# Patient Record
Sex: Female | Born: 1935
Health system: Southern US, Community
[De-identification: ages and names within clinical notes are randomized; demographics above are authoritative.]

## PROBLEM LIST (undated history)

## (undated) DIAGNOSIS — Z87442 Personal history of urinary calculi: Secondary | ICD-10-CM

## (undated) DIAGNOSIS — E782 Mixed hyperlipidemia: Secondary | ICD-10-CM

## (undated) DIAGNOSIS — N200 Calculus of kidney: Secondary | ICD-10-CM

## (undated) DIAGNOSIS — I10 Essential (primary) hypertension: Secondary | ICD-10-CM

## (undated) DIAGNOSIS — C801 Malignant (primary) neoplasm, unspecified: Secondary | ICD-10-CM

## (undated) HISTORY — DX: Essential (primary) hypertension: I10

## (undated) HISTORY — PX: OTHER SURGICAL HISTORY: SHX169

## (undated) HISTORY — DX: Calculus of kidney: N20.0

## (undated) HISTORY — PX: ABDOMINAL HYSTERECTOMY: SHX81

## (undated) HISTORY — DX: Mixed hyperlipidemia: E78.2

---

## 2004-07-27 ENCOUNTER — Encounter: Payer: Self-pay | Admitting: Orthopedic Surgery

## 2004-07-27 ENCOUNTER — Emergency Department (HOSPITAL_COMMUNITY): Admission: EM | Admit: 2004-07-27 | Discharge: 2004-07-27 | Payer: Self-pay | Admitting: Emergency Medicine

## 2004-08-05 ENCOUNTER — Ambulatory Visit (HOSPITAL_COMMUNITY): Admission: RE | Admit: 2004-08-05 | Discharge: 2004-08-05 | Payer: Self-pay | Admitting: Orthopedic Surgery

## 2004-11-27 HISTORY — PX: OTHER SURGICAL HISTORY: SHX169

## 2005-01-18 ENCOUNTER — Ambulatory Visit: Payer: Self-pay | Admitting: Orthopedic Surgery

## 2005-01-27 ENCOUNTER — Ambulatory Visit (HOSPITAL_COMMUNITY): Admission: RE | Admit: 2005-01-27 | Discharge: 2005-01-27 | Payer: Self-pay | Admitting: Orthopedic Surgery

## 2005-01-27 ENCOUNTER — Ambulatory Visit: Payer: Self-pay | Admitting: Orthopedic Surgery

## 2005-01-30 ENCOUNTER — Ambulatory Visit: Payer: Self-pay | Admitting: Orthopedic Surgery

## 2005-01-31 ENCOUNTER — Encounter (HOSPITAL_COMMUNITY): Admission: RE | Admit: 2005-01-31 | Discharge: 2005-03-02 | Payer: Self-pay | Admitting: Orthopedic Surgery

## 2005-02-20 ENCOUNTER — Ambulatory Visit: Payer: Self-pay | Admitting: Orthopedic Surgery

## 2005-02-23 ENCOUNTER — Ambulatory Visit (HOSPITAL_COMMUNITY): Admission: RE | Admit: 2005-02-23 | Discharge: 2005-02-23 | Payer: Self-pay | Admitting: Family Medicine

## 2005-09-19 ENCOUNTER — Encounter (INDEPENDENT_AMBULATORY_CARE_PROVIDER_SITE_OTHER): Payer: Self-pay | Admitting: Family Medicine

## 2005-09-19 LAB — CONVERTED CEMR LAB: Blood Glucose, Fasting: 96 mg/dL

## 2005-09-26 ENCOUNTER — Ambulatory Visit (HOSPITAL_COMMUNITY): Admission: RE | Admit: 2005-09-26 | Discharge: 2005-09-26 | Payer: Self-pay | Admitting: Family Medicine

## 2006-02-19 ENCOUNTER — Ambulatory Visit (HOSPITAL_COMMUNITY): Admission: RE | Admit: 2006-02-19 | Discharge: 2006-02-19 | Payer: Self-pay | Admitting: Family Medicine

## 2006-08-21 ENCOUNTER — Ambulatory Visit: Payer: Self-pay | Admitting: Family Medicine

## 2006-10-04 ENCOUNTER — Ambulatory Visit: Payer: Self-pay | Admitting: Family Medicine

## 2006-10-09 ENCOUNTER — Encounter (INDEPENDENT_AMBULATORY_CARE_PROVIDER_SITE_OTHER): Payer: Self-pay | Admitting: Family Medicine

## 2006-10-09 ENCOUNTER — Ambulatory Visit (HOSPITAL_COMMUNITY): Admission: RE | Admit: 2006-10-09 | Discharge: 2006-10-09 | Payer: Self-pay | Admitting: Family Medicine

## 2006-10-09 LAB — CONVERTED CEMR LAB
TSH: 1.558 microintl units/mL
WBC, blood: 4.2 10*3/uL

## 2006-10-29 ENCOUNTER — Other Ambulatory Visit: Admission: RE | Admit: 2006-10-29 | Discharge: 2006-10-29 | Payer: Self-pay | Admitting: Internal Medicine

## 2006-10-29 ENCOUNTER — Encounter (INDEPENDENT_AMBULATORY_CARE_PROVIDER_SITE_OTHER): Payer: Self-pay | Admitting: Family Medicine

## 2006-10-29 ENCOUNTER — Ambulatory Visit: Payer: Self-pay | Admitting: Family Medicine

## 2006-10-29 LAB — CONVERTED CEMR LAB: Pap Smear: NORMAL

## 2006-10-30 ENCOUNTER — Ambulatory Visit (HOSPITAL_COMMUNITY): Admission: RE | Admit: 2006-10-30 | Discharge: 2006-10-30 | Payer: Self-pay | Admitting: Family Medicine

## 2006-11-01 ENCOUNTER — Ambulatory Visit (HOSPITAL_COMMUNITY): Admission: RE | Admit: 2006-11-01 | Discharge: 2006-11-01 | Payer: Self-pay | Admitting: Family Medicine

## 2006-11-08 ENCOUNTER — Encounter (INDEPENDENT_AMBULATORY_CARE_PROVIDER_SITE_OTHER): Payer: Self-pay | Admitting: Family Medicine

## 2006-11-08 LAB — CONVERTED CEMR LAB: RBC count: 4.81 10*6/uL

## 2006-11-10 ENCOUNTER — Encounter (INDEPENDENT_AMBULATORY_CARE_PROVIDER_SITE_OTHER): Payer: Self-pay | Admitting: Family Medicine

## 2006-11-10 DIAGNOSIS — I10 Essential (primary) hypertension: Secondary | ICD-10-CM | POA: Insufficient documentation

## 2006-11-10 DIAGNOSIS — E785 Hyperlipidemia, unspecified: Secondary | ICD-10-CM | POA: Insufficient documentation

## 2006-11-26 ENCOUNTER — Ambulatory Visit: Payer: Self-pay | Admitting: Family Medicine

## 2006-11-26 ENCOUNTER — Encounter (INDEPENDENT_AMBULATORY_CARE_PROVIDER_SITE_OTHER): Payer: Self-pay | Admitting: Internal Medicine

## 2006-11-26 LAB — CONVERTED CEMR LAB
ALT: 23 units/L (ref 0–35)
AST: 22 units/L (ref 0–37)
Albumin: 4.1 g/dL (ref 3.5–5.2)
Alkaline Phosphatase: 64 units/L (ref 39–117)
BUN: 13 mg/dL (ref 6–23)
CO2: 31 meq/L (ref 19–32)
Calcium: 9.3 mg/dL (ref 8.4–10.5)
Chloride: 102 meq/L (ref 96–112)
Creatinine, Ser: 0.86 mg/dL (ref 0.40–1.20)
Glucose, Bld: 108 mg/dL — ABNORMAL HIGH (ref 70–99)
Potassium: 3.5 meq/L (ref 3.5–5.3)
Sodium: 143 meq/L (ref 135–145)
Total Bilirubin: 0.5 mg/dL (ref 0.3–1.2)
Total Protein: 6.8 g/dL (ref 6.0–8.3)

## 2007-01-29 ENCOUNTER — Encounter (INDEPENDENT_AMBULATORY_CARE_PROVIDER_SITE_OTHER): Payer: Self-pay | Admitting: Family Medicine

## 2007-02-26 ENCOUNTER — Ambulatory Visit: Payer: Self-pay | Admitting: Family Medicine

## 2007-02-26 LAB — CONVERTED CEMR LAB
Cholesterol, target level: 200 mg/dL
HDL goal, serum: 40 mg/dL
LDL Goal: 130 mg/dL

## 2007-02-27 ENCOUNTER — Encounter (INDEPENDENT_AMBULATORY_CARE_PROVIDER_SITE_OTHER): Payer: Self-pay | Admitting: Family Medicine

## 2007-02-27 LAB — CONVERTED CEMR LAB
ALT: 20 units/L (ref 0–35)
AST: 20 units/L (ref 0–37)
Albumin: 4.3 g/dL (ref 3.5–5.2)
Alkaline Phosphatase: 88 units/L (ref 39–117)
BUN: 16 mg/dL (ref 6–23)
CO2: 27 meq/L (ref 19–32)
Calcium: 9.2 mg/dL (ref 8.4–10.5)
Chloride: 100 meq/L (ref 96–112)
Cholesterol: 146 mg/dL (ref 0–200)
Creatinine, Ser: 0.89 mg/dL (ref 0.40–1.20)
Glucose, Bld: 98 mg/dL (ref 70–99)
HDL: 62 mg/dL (ref >39)
LDL Cholesterol: 72 mg/dL (ref 0–99)
Potassium: 3.6 meq/L (ref 3.5–5.3)
Sodium: 142 meq/L (ref 135–145)
Total Bilirubin: 0.6 mg/dL (ref 0.3–1.2)
Total CHOL/HDL Ratio: 2.4
Total Protein: 7.1 g/dL (ref 6.0–8.3)
Triglycerides: 61 mg/dL (ref <150)
VLDL: 12 mg/dL (ref 0–40)

## 2007-06-11 ENCOUNTER — Ambulatory Visit: Payer: Self-pay | Admitting: Family Medicine

## 2007-06-14 ENCOUNTER — Encounter (INDEPENDENT_AMBULATORY_CARE_PROVIDER_SITE_OTHER): Payer: Self-pay | Admitting: Family Medicine

## 2007-06-14 ENCOUNTER — Telehealth (INDEPENDENT_AMBULATORY_CARE_PROVIDER_SITE_OTHER): Payer: Self-pay | Admitting: *Deleted

## 2007-06-25 ENCOUNTER — Ambulatory Visit: Payer: Self-pay | Admitting: Family Medicine

## 2007-06-26 ENCOUNTER — Ambulatory Visit (HOSPITAL_COMMUNITY): Payer: Self-pay | Admitting: Oncology

## 2007-06-26 ENCOUNTER — Encounter (HOSPITAL_COMMUNITY): Admission: RE | Admit: 2007-06-26 | Discharge: 2007-07-26 | Payer: Self-pay | Admitting: Oncology

## 2007-07-10 ENCOUNTER — Ambulatory Visit: Payer: Self-pay | Admitting: Gastroenterology

## 2007-07-18 ENCOUNTER — Encounter (INDEPENDENT_AMBULATORY_CARE_PROVIDER_SITE_OTHER): Payer: Self-pay | Admitting: Family Medicine

## 2007-08-14 ENCOUNTER — Ambulatory Visit: Payer: Self-pay | Admitting: Family Medicine

## 2007-08-15 LAB — CONVERTED CEMR LAB
ALT: 19 units/L (ref 0–35)
AST: 18 units/L (ref 0–37)
Albumin: 4.2 g/dL (ref 3.5–5.2)
Alkaline Phosphatase: 73 units/L (ref 39–117)
BUN: 11 mg/dL (ref 6–23)
CO2: 30 meq/L (ref 19–32)
Calcium: 9.3 mg/dL (ref 8.4–10.5)
Chloride: 102 meq/L (ref 96–112)
Creatinine, Ser: 0.84 mg/dL (ref 0.40–1.20)
Glucose, Bld: 82 mg/dL (ref 70–99)
Potassium: 3.6 meq/L (ref 3.5–5.3)
Sodium: 144 meq/L (ref 135–145)
Total Bilirubin: 1 mg/dL (ref 0.3–1.2)
Total Protein: 6.8 g/dL (ref 6.0–8.3)

## 2007-08-20 ENCOUNTER — Ambulatory Visit (HOSPITAL_COMMUNITY): Admission: RE | Admit: 2007-08-20 | Discharge: 2007-08-20 | Payer: Self-pay | Admitting: Gastroenterology

## 2007-08-20 ENCOUNTER — Ambulatory Visit: Payer: Self-pay | Admitting: Gastroenterology

## 2007-09-24 ENCOUNTER — Ambulatory Visit: Payer: Self-pay | Admitting: Gastroenterology

## 2007-09-24 ENCOUNTER — Ambulatory Visit: Payer: Self-pay | Admitting: Family Medicine

## 2007-10-09 ENCOUNTER — Telehealth (INDEPENDENT_AMBULATORY_CARE_PROVIDER_SITE_OTHER): Payer: Self-pay | Admitting: *Deleted

## 2007-11-12 ENCOUNTER — Encounter (INDEPENDENT_AMBULATORY_CARE_PROVIDER_SITE_OTHER): Payer: Self-pay | Admitting: Family Medicine

## 2007-11-13 ENCOUNTER — Ambulatory Visit: Payer: Self-pay | Admitting: Family Medicine

## 2007-11-13 LAB — CONVERTED CEMR LAB

## 2007-11-14 ENCOUNTER — Telehealth (INDEPENDENT_AMBULATORY_CARE_PROVIDER_SITE_OTHER): Payer: Self-pay | Admitting: *Deleted

## 2007-11-14 LAB — CONVERTED CEMR LAB
ALT: 17 units/L (ref 0–35)
AST: 21 units/L (ref 0–37)
Albumin: 4.3 g/dL (ref 3.5–5.2)
Alkaline Phosphatase: 77 units/L (ref 39–117)
BUN: 14 mg/dL (ref 6–23)
Basophils Absolute: 0 10*3/uL (ref 0.0–0.1)
Basophils Relative: 0 % (ref 0–1)
CO2: 27 meq/L (ref 19–32)
Calcium: 8.9 mg/dL (ref 8.4–10.5)
Chloride: 100 meq/L (ref 96–112)
Cholesterol: 167 mg/dL (ref 0–200)
Creatinine, Ser: 1 mg/dL (ref 0.40–1.20)
Eosinophils Absolute: 0.1 10*3/uL — ABNORMAL LOW (ref 0.2–0.7)
Eosinophils Relative: 1 % (ref 0–5)
Glucose, Bld: 105 mg/dL — ABNORMAL HIGH (ref 70–99)
HCT: 39.1 % (ref 36.0–46.0)
HDL: 56 mg/dL (ref >39)
Hemoglobin: 12.8 g/dL (ref 12.0–15.0)
LDL Cholesterol: 91 mg/dL (ref 0–99)
Lymphocytes Relative: 25 % (ref 12–46)
Lymphs Abs: 1.4 10*3/uL (ref 0.7–4.0)
MCHC: 32.7 g/dL (ref 30.0–36.0)
MCV: 80.8 fL (ref 78.0–100.0)
Monocytes Absolute: 0.5 10*3/uL (ref 0.1–1.0)
Monocytes Relative: 10 % (ref 3–12)
Neutro Abs: 3.6 10*3/uL (ref 1.7–7.7)
Neutrophils Relative %: 64 % (ref 43–77)
Platelets: 241 10*3/uL (ref 150–400)
Potassium: 3.4 meq/L — ABNORMAL LOW (ref 3.5–5.3)
RBC: 4.84 M/uL (ref 3.87–5.11)
RDW: 14.6 % (ref 11.5–15.5)
Sodium: 142 meq/L (ref 135–145)
TSH: 1.571 microintl units/mL (ref 0.350–5.50)
Total Bilirubin: 0.7 mg/dL (ref 0.3–1.2)
Total CHOL/HDL Ratio: 3
Total Protein: 6.9 g/dL (ref 6.0–8.3)
Triglycerides: 100 mg/dL (ref <150)
VLDL: 20 mg/dL (ref 0–40)
WBC: 5.7 10*3/uL (ref 4.0–10.5)

## 2007-11-18 ENCOUNTER — Ambulatory Visit (HOSPITAL_COMMUNITY): Admission: RE | Admit: 2007-11-18 | Discharge: 2007-11-18 | Payer: Self-pay | Admitting: Family Medicine

## 2007-11-19 ENCOUNTER — Telehealth (INDEPENDENT_AMBULATORY_CARE_PROVIDER_SITE_OTHER): Payer: Self-pay | Admitting: *Deleted

## 2007-12-23 ENCOUNTER — Encounter (INDEPENDENT_AMBULATORY_CARE_PROVIDER_SITE_OTHER): Payer: Self-pay | Admitting: Family Medicine

## 2007-12-25 ENCOUNTER — Ambulatory Visit: Payer: Self-pay | Admitting: Family Medicine

## 2008-02-27 ENCOUNTER — Encounter (INDEPENDENT_AMBULATORY_CARE_PROVIDER_SITE_OTHER): Payer: Self-pay | Admitting: Family Medicine

## 2008-03-24 ENCOUNTER — Ambulatory Visit: Payer: Self-pay | Admitting: Family Medicine

## 2008-03-24 LAB — CONVERTED CEMR LAB
Bilirubin Urine: NEGATIVE
Glucose, Urine, Semiquant: NEGATIVE
Ketones, urine, test strip: NEGATIVE
Nitrite: NEGATIVE
Protein, U semiquant: 30
Specific Gravity, Urine: 1.015
Urobilinogen, UA: 0.2
WBC Urine, dipstick: NEGATIVE
pH: 6.5

## 2008-03-25 ENCOUNTER — Encounter (INDEPENDENT_AMBULATORY_CARE_PROVIDER_SITE_OTHER): Payer: Self-pay | Admitting: Family Medicine

## 2008-03-25 ENCOUNTER — Telehealth (INDEPENDENT_AMBULATORY_CARE_PROVIDER_SITE_OTHER): Payer: Self-pay | Admitting: *Deleted

## 2008-03-25 LAB — CONVERTED CEMR LAB
ALT: 16 units/L (ref 0–35)
AST: 20 units/L (ref 0–37)
Albumin: 4.1 g/dL (ref 3.5–5.2)
Alkaline Phosphatase: 73 units/L (ref 39–117)
BUN: 15 mg/dL (ref 6–23)
Bacteria, UA: NONE SEEN
CO2: 26 meq/L (ref 19–32)
Calcium: 8.7 mg/dL (ref 8.4–10.5)
Chloride: 103 meq/L (ref 96–112)
Creatinine, Ser: 0.98 mg/dL (ref 0.40–1.20)
Glucose, Bld: 111 mg/dL — ABNORMAL HIGH (ref 70–99)
Potassium: 3.4 meq/L — ABNORMAL LOW (ref 3.5–5.3)
RBC / HPF: NONE SEEN (ref <3)
Sodium: 145 meq/L (ref 135–145)
Total Bilirubin: 0.5 mg/dL (ref 0.3–1.2)
Total Protein: 6.7 g/dL (ref 6.0–8.3)

## 2008-04-09 ENCOUNTER — Telehealth (INDEPENDENT_AMBULATORY_CARE_PROVIDER_SITE_OTHER): Payer: Self-pay | Admitting: *Deleted

## 2008-05-05 ENCOUNTER — Ambulatory Visit: Payer: Self-pay | Admitting: Family Medicine

## 2008-06-22 ENCOUNTER — Encounter (INDEPENDENT_AMBULATORY_CARE_PROVIDER_SITE_OTHER): Payer: Self-pay | Admitting: Family Medicine

## 2008-06-25 ENCOUNTER — Encounter (HOSPITAL_COMMUNITY): Admission: RE | Admit: 2008-06-25 | Discharge: 2008-07-25 | Payer: Self-pay | Admitting: Oncology

## 2008-06-25 ENCOUNTER — Encounter (INDEPENDENT_AMBULATORY_CARE_PROVIDER_SITE_OTHER): Payer: Self-pay | Admitting: Family Medicine

## 2008-06-25 ENCOUNTER — Ambulatory Visit (HOSPITAL_COMMUNITY): Payer: Self-pay | Admitting: Oncology

## 2008-08-07 ENCOUNTER — Ambulatory Visit: Payer: Self-pay | Admitting: Family Medicine

## 2008-09-18 ENCOUNTER — Ambulatory Visit: Payer: Self-pay | Admitting: Family Medicine

## 2008-12-18 ENCOUNTER — Ambulatory Visit: Payer: Self-pay | Admitting: Family Medicine

## 2008-12-19 ENCOUNTER — Encounter (INDEPENDENT_AMBULATORY_CARE_PROVIDER_SITE_OTHER): Payer: Self-pay | Admitting: Family Medicine

## 2008-12-22 LAB — CONVERTED CEMR LAB
ALT: 18 units/L (ref 0–35)
AST: 21 units/L (ref 0–37)
Albumin: 3.8 g/dL (ref 3.5–5.2)
Alkaline Phosphatase: 75 units/L (ref 39–117)
BUN: 11 mg/dL (ref 6–23)
Basophils Absolute: 0 10*3/uL (ref 0.0–0.1)
Basophils Relative: 0 % (ref 0–1)
CO2: 26 meq/L (ref 19–32)
Calcium: 8.2 mg/dL — ABNORMAL LOW (ref 8.4–10.5)
Chloride: 103 meq/L (ref 96–112)
Cholesterol: 144 mg/dL (ref 0–200)
Creatinine, Ser: 0.88 mg/dL (ref 0.40–1.20)
Eosinophils Absolute: 0.1 10*3/uL (ref 0.0–0.7)
Eosinophils Relative: 3 % (ref 0–5)
Glucose, Bld: 105 mg/dL — ABNORMAL HIGH (ref 70–99)
HCT: 42 % (ref 36.0–46.0)
HDL: 59 mg/dL (ref >39)
Hemoglobin: 13.9 g/dL (ref 12.0–15.0)
LDL Cholesterol: 73 mg/dL (ref 0–99)
Lymphocytes Relative: 31 % (ref 12–46)
Lymphs Abs: 1.4 10*3/uL (ref 0.7–4.0)
MCHC: 33.1 g/dL (ref 30.0–36.0)
MCV: 82.8 fL (ref 78.0–100.0)
Monocytes Absolute: 0.5 10*3/uL (ref 0.1–1.0)
Monocytes Relative: 10 % (ref 3–12)
Neutro Abs: 2.7 10*3/uL (ref 1.7–7.7)
Neutrophils Relative %: 56 % (ref 43–77)
Platelets: 172 10*3/uL (ref 150–400)
Potassium: 3.3 meq/L — ABNORMAL LOW (ref 3.5–5.3)
RBC: 5.07 M/uL (ref 3.87–5.11)
RDW: 14.7 % (ref 11.5–15.5)
Sodium: 143 meq/L (ref 135–145)
TSH: 1.491 microintl units/mL (ref 0.350–4.50)
Total Bilirubin: 0.8 mg/dL (ref 0.3–1.2)
Total CHOL/HDL Ratio: 2.4
Total Protein: 6.4 g/dL (ref 6.0–8.3)
Triglycerides: 60 mg/dL (ref <150)
VLDL: 12 mg/dL (ref 0–40)
WBC: 4.7 10*3/uL (ref 4.0–10.5)

## 2008-12-24 ENCOUNTER — Encounter (INDEPENDENT_AMBULATORY_CARE_PROVIDER_SITE_OTHER): Payer: Self-pay | Admitting: Family Medicine

## 2008-12-24 ENCOUNTER — Ambulatory Visit (HOSPITAL_COMMUNITY): Admission: RE | Admit: 2008-12-24 | Discharge: 2008-12-24 | Payer: Self-pay | Admitting: Family Medicine

## 2009-01-06 ENCOUNTER — Ambulatory Visit (HOSPITAL_COMMUNITY): Admission: RE | Admit: 2009-01-06 | Discharge: 2009-01-06 | Payer: Self-pay | Admitting: Family Medicine

## 2009-04-08 ENCOUNTER — Ambulatory Visit: Payer: Self-pay | Admitting: Family Medicine

## 2009-04-16 ENCOUNTER — Ambulatory Visit: Payer: Self-pay | Admitting: Family Medicine

## 2009-04-17 ENCOUNTER — Encounter (INDEPENDENT_AMBULATORY_CARE_PROVIDER_SITE_OTHER): Payer: Self-pay | Admitting: Family Medicine

## 2009-04-19 LAB — CONVERTED CEMR LAB
ALT: 15 units/L (ref 0–35)
AST: 19 units/L (ref 0–37)
Albumin: 4.1 g/dL (ref 3.5–5.2)
Alkaline Phosphatase: 73 units/L (ref 39–117)
BUN: 13 mg/dL (ref 6–23)
CO2: 27 meq/L (ref 19–32)
Calcium: 8.9 mg/dL (ref 8.4–10.5)
Chloride: 100 meq/L (ref 96–112)
Creatinine, Ser: 0.95 mg/dL (ref 0.40–1.20)
Glucose, Bld: 97 mg/dL (ref 70–99)
Potassium: 3.8 meq/L (ref 3.5–5.3)
Sodium: 142 meq/L (ref 135–145)
Total Bilirubin: 0.6 mg/dL (ref 0.3–1.2)
Total Protein: 7 g/dL (ref 6.0–8.3)

## 2009-06-25 ENCOUNTER — Encounter (INDEPENDENT_AMBULATORY_CARE_PROVIDER_SITE_OTHER): Payer: Self-pay | Admitting: Family Medicine

## 2009-06-25 ENCOUNTER — Encounter (HOSPITAL_COMMUNITY): Admission: RE | Admit: 2009-06-25 | Discharge: 2009-07-25 | Payer: Self-pay | Admitting: Oncology

## 2009-06-25 ENCOUNTER — Ambulatory Visit (HOSPITAL_COMMUNITY): Payer: Self-pay | Admitting: Oncology

## 2009-07-06 ENCOUNTER — Encounter (INDEPENDENT_AMBULATORY_CARE_PROVIDER_SITE_OTHER): Payer: Self-pay | Admitting: Family Medicine

## 2009-07-16 ENCOUNTER — Ambulatory Visit: Payer: Self-pay | Admitting: Family Medicine

## 2009-08-24 ENCOUNTER — Encounter (INDEPENDENT_AMBULATORY_CARE_PROVIDER_SITE_OTHER): Payer: Self-pay | Admitting: Family Medicine

## 2010-01-07 ENCOUNTER — Ambulatory Visit (HOSPITAL_COMMUNITY): Admission: RE | Admit: 2010-01-07 | Discharge: 2010-01-07 | Payer: Self-pay | Admitting: Internal Medicine

## 2010-07-06 ENCOUNTER — Encounter (HOSPITAL_COMMUNITY): Admission: RE | Admit: 2010-07-06 | Discharge: 2010-08-05 | Payer: Self-pay | Admitting: Oncology

## 2010-07-06 ENCOUNTER — Ambulatory Visit (HOSPITAL_COMMUNITY): Payer: Self-pay | Admitting: Oncology

## 2010-09-07 ENCOUNTER — Ambulatory Visit: Payer: Self-pay | Admitting: Orthopedic Surgery

## 2010-09-08 ENCOUNTER — Encounter: Payer: Self-pay | Admitting: Orthopedic Surgery

## 2010-12-18 ENCOUNTER — Encounter: Payer: Self-pay | Admitting: Family Medicine

## 2010-12-28 NOTE — Progress Notes (Signed)
Summary: Progress note  Progress note   Imported By: Jacklynn Ganong 08/19/2010 08:51:35  _____________________________________________________________________  External Attachment:    Type:   Image     Comment:   External Document

## 2010-12-28 NOTE — Progress Notes (Signed)
Summary: Initial evaluation  Initial evaluation   Imported By: Jacklynn Ganong 08/19/2010 08:49:34  _____________________________________________________________________  External Attachment:    Type:   Image     Comment:   External Document

## 2010-12-28 NOTE — Op Note (Signed)
Summary: Left knee arthroscopy  Left knee arthroscopy   Imported By: Jacklynn Ganong 08/19/2010 08:50:45  _____________________________________________________________________  External Attachment:    Type:   Image     Comment:   External Document

## 2010-12-28 NOTE — Letter (Signed)
Summary: History form  History form   Imported By: Jacklynn Ganong 09/08/2010 16:17:09  _____________________________________________________________________  External Attachment:    Type:   Image     Comment:   External Document

## 2010-12-28 NOTE — Assessment & Plan Note (Signed)
Summary: BI SHOULDER PAIN NEEDS XR/BCBS/BSF   Vital Signs:  Patient profile:   77 year old female Height:      61 inches Weight:      179 pounds Pulse rate:   62 / minute Resp:     16 per minute  Vitals Entered By: Fuller Canada MD (September 07, 2010 2:54 PM)  Visit Type:  new patient Referring Provider:  self Primary Provider:  Dr. Dwana Melena  CC:  shoulder pain.  History of Present Illness: I saw Miranda [name] in the office today for an initial visit.  She is a 77 years old woman with the complaint ZO:[id number] shoulder pain.  Xrays today.  Meds: Benicar 40/25, Celebrex 200mg , Vesicare 5mg , Lipitor 10mg , ASA 81.The patient complains of pain off and on for the last month worse at night involving both shoulders.  She denies neck pain.  The pain is described as severe, throbbing and intermittent starting gradually.  The pain is exacerbated when she does overhead activity and the pain radiates into the upper anterolateral deltoid.  She is on Celebrex 200 mg once a day and she's been on it for a month but it doesn't seem to help.  She does not complain of any catching locking or numbness.      Allergies: No Known Drug Allergies  Past History:  Past Surgical History: 1. Hysterectomy - excessive bleeding, no cancer 2. Left Lung-lobectomy lower lobe - lung cancer. 3. Left knee arthroscopuy due to torn ligmanent and cartiallge Romeo Apple 2006.  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  Review of  Systems Constitutional:  Complains of weight gain; denies weight loss, fever, chills, and fatigue. Cardiovascular:  Denies chest pain, palpitations, fainting, and murmurs. Respiratory:  Denies short of breath, wheezing, couch, tightness, pain on inspiration, and snoring . Gastrointestinal:  Denies heartburn, nausea, vomiting, diarrhea, constipation, and blood in your stools. Genitourinary:  Complains of frequency; denies urgency, difficulty urinating, painful urination, flank pain, and bleeding in urine. Neurologic:  Denies numbness, tingling, unsteady gait, dizziness, tremors, and seizure. Musculoskeletal:  Complains of swelling; denies joint pain, instability, stiffness, redness, heat, and muscle pain. Endocrine:  Denies excessive thirst, exessive urination, and heat or cold intolerance. Psychiatric:  Denies nervousness, depression, anxiety, and hallucinations. Skin:  Denies changes in the skin, poor healing, rash, itching, and redness. HEENT:  Denies blurred or double vision, eye pain, redness, and watering. Immunology:  Complains of seasonal allergies; denies sinus problems and allergic to bee stings. Hemoatologic:  Denies easy bleeding and brusing.  Physical Exam  Skin:  intact without lesions or rashes Cervical Nodes:  no significant adenopathy Psych:  alert and cooperative; normal mood and affect; normal attention span and concentration   Shoulder/Elbow Exam  General:    Well-developed, well-nourished, normal body habitus; no deformities, normal grooming.    Vital signs are stable as recorded  Skin:    Intact, no scars, lesions, rashes, cafe au lait spots or bruising.    Inspection:    Inspection is normal.  Palpation:    Non-tender to palpation bilaterally.    Vascular:    Radial, ulnar, brachial, and axillary pulses 2+ and symmetric; capillary refill less than 2 seconds; no evidence of ischemia, clubbing, or cyanosis.    Sensory:    Gross sensation intact in the  upper extremities.    Motor:    Normal strength in the upper extremities.    Reflexes:    Normal reflexes in the upper extremities.    Shoulder Exam:    Right:    Inspection:  Normal    Palpation:  Normal    Stability:  stable    Tenderness:  no    Swelling:  no    Erythema:  no    Range of Motion:       Flexion-Active: 120       Extension-Active: 45       Flexion-Passive: 150       Extension-Passive: 45       External Rotation : 45       Interior Rotation : L1    Left:    Inspection:  Normal    Palpation:  Normal    Stability:  stable    Tenderness:  no    Swelling:  no    Erythema:  no    Range of Motion:       Flexion-Active: 120       Extension-Active: 45       Flexion-Passive: 150       Extension-Passive: 45       External Rotation : 45       Interior Rotation : L1  Tinel's:    Tinel's negative over cubital, pronator, carpal, and Guyon's area.    Impingement Sign NEER:    Right positive; Left positive Impingement Sign HAWKINS:    Right negative; Left negative Apprehension Sign:    Right negative; Left negative Sulcus Sign:    Right negative; Left negative Yerguson:    Right negative; Left negative AC joint Adduction Test:    Right negative; Left negative FLEX-IR POST STRESS Test:    Right negative; Left negative   Impression & Recommendations:  Problem # 1:  IMPINGEMENT SYNDROME (ICD-726.2) Assessment New  bilateral shoulder x-rays AP and lateral.  The glenohumeral joint spaces are well maintained.  There were no bone lesions.  The acromion is a type II.  There is some sclerosis of the greater tuberosity especially on the RIGHT side.  Impression no acute findings in the shoulder x-ray normal shoulder x-ray  Bilateral subacromial space injections RIGHT side first and the LEFT done in the following manner Verbal consent obtained/The shoulder was injected with depomedrol 40mg /cc and sensorcaine .25% . There were no complications  Orders: New  Patient Level III (13086) Shoulder x-ray, bilateral (57846) Joint Aspirate / Injection, Large (20610) Depo- Medrol 40mg  (J1030)  Patient Instructions: 1)  You have received an injection of cortisone today. You may experience increased pain at the injection site. Apply ice pack to the area for 20 minutes every 2 hours and take 2 xtra strength tylenol every 8 hours. This increased pain will usually resolve in 24 hours. The injection will take effect in 3-10 days.  2)  Limit activity to comfort and avoid activities that increase discomfort.  Apply moist heat and/or ice to shoulder and take medication celebrex .  3)  Please schedule a follow-up appointment as needed.

## 2011-01-17 ENCOUNTER — Other Ambulatory Visit (HOSPITAL_COMMUNITY): Payer: Self-pay | Admitting: Internal Medicine

## 2011-01-17 DIAGNOSIS — Z139 Encounter for screening, unspecified: Secondary | ICD-10-CM

## 2011-01-20 ENCOUNTER — Ambulatory Visit (HOSPITAL_COMMUNITY)
Admission: RE | Admit: 2011-01-20 | Discharge: 2011-01-20 | Disposition: A | Discharge status: Home / Self Care | Payer: Medicare Other | Source: Ambulatory Visit | Attending: Internal Medicine | Admitting: Internal Medicine

## 2011-01-20 DIAGNOSIS — Z139 Encounter for screening, unspecified: Secondary | ICD-10-CM

## 2011-01-20 DIAGNOSIS — Z1231 Encounter for screening mammogram for malignant neoplasm of breast: Secondary | ICD-10-CM | POA: Insufficient documentation

## 2011-02-13 ENCOUNTER — Ambulatory Visit (HOSPITAL_COMMUNITY)
Admission: RE | Admit: 2011-02-13 | Discharge: 2011-02-13 | Disposition: A | Discharge status: Home / Self Care | Payer: Medicare Other | Source: Ambulatory Visit | Attending: Internal Medicine | Admitting: Internal Medicine

## 2011-02-13 ENCOUNTER — Other Ambulatory Visit (HOSPITAL_COMMUNITY): Payer: Self-pay | Admitting: Internal Medicine

## 2011-02-13 DIAGNOSIS — R52 Pain, unspecified: Secondary | ICD-10-CM

## 2011-02-13 DIAGNOSIS — M25559 Pain in unspecified hip: Secondary | ICD-10-CM | POA: Insufficient documentation

## 2011-03-10 ENCOUNTER — Encounter: Payer: Self-pay | Admitting: Orthopedic Surgery

## 2011-03-28 ENCOUNTER — Ambulatory Visit (INDEPENDENT_AMBULATORY_CARE_PROVIDER_SITE_OTHER): Payer: Medicare Other | Admitting: Orthopedic Surgery

## 2011-03-28 VITALS — Ht 60.0 in | Wt 170.0 lb

## 2011-03-28 DIAGNOSIS — M5430 Sciatica, unspecified side: Secondary | ICD-10-CM

## 2011-03-28 MED ORDER — GABAPENTIN 100 MG PO CAPS: ORAL_CAPSULE | ORAL | Status: DC

## 2011-03-29 ENCOUNTER — Encounter: Payer: Self-pay | Admitting: Orthopedic Surgery

## 2011-03-29 NOTE — Progress Notes (Signed)
77 year old female with LEFT hip and leg pain x2 months  Her pain came on gradually without any history of injury.  She's had no previous treatment.  The pain is described as 5/10 it seems to come and go and is worse at night.  It is sometimes better when she is standing and worse when she's sleeping.  Pedicle systems review includes a history of constipation frequency ankle swelling and seasonal ALLERGIES the other systems reviewed and were normal  She's had LEFT knee arthroscopy in 2006  She has arthritis and cancer as part of her family history she is divorced and retired doesn't smoke social drinker.  General appearance is normal she is mildly overweight  She is oriented x3 her mood and affect are normal  She ambulates with no assistive device has no limp  The lumbar spine is tender to palpation on the LEFT side and LEFT gluteal region.  His skin is intact.  Straight leg raise is negative bilaterally  Her lower extremities are normal to inspection and palpation with normal range of motion.  Her strength is 5 over 5 with manual muscle testing the joints are stable the skin is intact  Her pulses are excellent with normal temperature and no edema  Sensory exam is normal  Pathologic reflexes normal.  Cornish and balance are normal  Plain films of the LEFT hip show degenerative changes mild.  We attempted lumbar spine films here only the spot film came out with good clarity and it shows that she has degenerative disc disease and facet arthritis at L5-S1, L4-L5 with joint space narrowing.  Impression sciatica recommend increased gabapentin. Followup one month I also added 12 day dose pack

## 2011-04-11 NOTE — Op Note (Signed)
Miranda [name], Miranda [name]              ACCOUNT NO.:  [id number]   MEDICAL RECORD NO.:  [id number]          PATIENT TYPE:  AMB   LOCATION:  DAY                           FACILITY:  [facility]   PHYSICIAN:  Kassie Mends, M.D.      DATE OF BIRTH:  15-Aug-1934   DATE OF PROCEDURE:  08/20/2007  DATE OF DISCHARGE:                               OPERATIVE REPORT   REFERRING PHYSICIAN:  Franchot Heidelberg, M.D.   PROCEDURE:  Colonoscopy with snare cautery polypectomy, polyp  obliterated and unable to be retrieved.   INDICATION FOR EXAMINATION:  Miranda [name] is a 77 year old female who has a  first-degree relative who had colon cancer diagnosed at age greater than  13.  She presents with a change in bowel habits.  Her last colonoscopy  was 5 years ago.  She denies any history of polyps.   FINDINGS:  1. A 5-mm sessile sigmoid colon polyp removed via snare cautery .  In      an attempt to cauterize the base of the polyp, the polyp was      obliterated.  It was not retrieved.  Otherwise no masses,      inflammatory changes, diverticula or AVMs seen.  2. Normal retroflexed view of the rectum.   RECOMMENDATIONS:  1. She should follow a high-fiber diet.  She is given a handout on      high-fiber diet, constipation and polyps.  2. Screening colonoscopy in 5 years because her polyps could not be      retrieved.  3. Begin aspirin on August 26, 2007.  4. Follow up in 1 month with Dr. Cira Servant for constipation.  5. Add MiraLax 17 grams daily.   MEDICATIONS:  1. Demerol 75 mg IV.  2. Versed 3 mg IV.   PROCEDURE TECHNIQUE:  Physical exam was performed.  Informed consent was  obtained from the patient after explaining the benefits, risks and  alternatives to the procedure.  The patient was connected to monitor and  placed in left lateral position.  Continuous oxygen was provided by  nasal cannula and IV medicine administered through an indwelling  cannula.  After administration of sedation and rectal exam,  the  patient's rectum was intubated, and the  scope was advanced under direct visualization to the cecum.  The scope  was removed slowly by carefully examining the color, texture, anatomy  and integrity of the mucosa on the way out.  The patient was recovered  in endoscopy and discharged home in satisfactory condition.      Kassie Mends, M.D.  Electronically Signed     SM/MEDQ  D:  08/20/2007  T:  08/20/2007  Job:  [id number]

## 2011-04-11 NOTE — Consult Note (Signed)
Miranda [name], Miranda [name]              ACCOUNT NO.:  [id number]   MEDICAL RECORD NO.:  [id number]          PATIENT TYPE:  AMB   LOCATION:  DAY                           FACILITY:  [facility]   PHYSICIAN:  Kassie Mends, M.D.      DATE OF BIRTH:  26-Jul-1934   DATE OF CONSULTATION:  07/10/2007  DATE OF DISCHARGE:                                 CONSULTATION   REASON FOR CONSULTATION:  Change in bowel movements, constipation, need  for colonoscopy.   PRIMARY CARE PHYSICIAN:  Franchot Heidelberg, M.D.   HISTORY OF PRESENT ILLNESS:  Ms. Balash is a 77 year old African-American  female who presents for further evaluation of a change in bowel  movements and constipation.  For the past 4-5 months, she has noted  increasing constipation.  She notes that she has had changes in her  medications and wonders if this is the cause.  She has had recent  increase in her atenolol and addition of amlodipine/benazepril.  She  states she is having a bowel movement about every other day.  At times,  she does have to take Dulcolax.  She has hard stool and has to strain at  times.  Denies any melena, rectal bleeding, abdominal pain, nausea,  vomiting, heartburn, weight loss or anorexia.  Her family history is  significant for colon cancer.  Her father was diagnosed at age 5 and  died with metastatic disease.  The patient's last colonoscopy was 5  years ago before she moved to the area.   CURRENT MEDICATIONS:  1. Atenolol 100 mg daily.  2. Caltrate 600 mg daily.  3. Amlodipine/benazepril 5/10 mg daily.  4. Norvasc 5 mg daily.  5. Lipitor 20 mg daily.  6. Aspirin 81 mg daily.  7. One-A-Day vitamin daily.   ALLERGIES:  No known drug allergies.   PAST MEDICAL HISTORY:  1. Hyperlipidemia.  2. Hypertension.  3. Osteoarthritis.  4. History of adenocarcinoma of the left upper lobe of the lung,      status post resection in 2002.  5. Hysterectomy in 1972 for excessive bleeding.  6. Left knee surgery in 2005.   FAMILY HISTORY:  Mother succumbed to a brain aneurysm at age 48.  Father  died at age 54 of metastatic colon cancer; was diagnosis at age 68.  She  had a sister who died of a stroke at age 22.   SOCIAL HISTORY:  She is divorced.  She has 2 children.  She is living  with one of her sons.  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  She moved  back to her hometown of [place] with her son about 5 years ago.  She  has never been a smoker.  Occasionally drinks alcohol socially, but very  infrequently.   REVIEW OF SYSTEMS:  See history of present illness for GI,  CONSTITUTIONAL.  CARDIOPULMONARY:  She denies any chest pain.  She notes  dyspnea on exertion only when she climbs stairs.   PHYSICAL EXAMINATION:  VITAL SIGNS:  Weight 183, height 5 feet.  Temperature 98.1, blood pressure 128/80, pulse 63.  GENERAL:  Pleasant, well-nourished, well-developed African-American  female in no acute distress.  SKIN:  Warm and dry.  No jaundice.  HEENT:  Sclerae nonicteric.  Oropharyngeal mucosal moist and pink.  No  lymphadenopathy  CHEST:  Lungs are clear to auscultation.  CARDIAC:  Regular rate and rhythm.  Normal S1 and S2.  No murmurs, rubs,  or gallops.  ABDOMEN:  Positive bowel sounds, obese but symmetrical.  Soft,  nontender, nondistended.  No organomegaly or masses.  No rebound  tenderness or guarding.  No abdominal bruits or hernias.  EXTREMITIES:  No edema.   LABORATORY DATA:  Labs from February 27, 2007 - LFT's were normal.   IMPRESSION:  The patient is a pleasant 77 year old lady with a personal  history of lung cancer, family history of colorectal cancer in a first  degree relative, who presents with a change in bowel movements.  Her  increasing constipation could be due to a change in her antihypertensive  medications.  Her last colonoscopy was around 5 years ago.  Given her  change in bowel movements and family  history, will need to pursue  colonoscopy for further evaluation.  I discussed risks, alternatives and  benefits with regards to risks of reaction to medication, bleeding,  infection or perforation, and she is agreeable to proceed.   PLAN:  1. She was encouraged to continue a high fiber diet.  2. May use OTC MiraLax 17 gm daily as needed for constipation.  3. Colonoscopy by Dr. Kassie Mends in the near future.   I would like to thank Dr. Glenford Peers for allowing [place] to take part in  the care of this patient.      Tana Coast, P.A.      Kassie Mends, M.D.  Electronically Signed    LL/MEDQ  D:  07/10/2007  T:  07/11/2007  Job:  [id number]   cc:   Ladona Horns. Mariel Sleet, MD  Fax: [phone]   Franchot Heidelberg, M.D.

## 2011-04-11 NOTE — Assessment & Plan Note (Signed)
NAMEJALAIYAH, Miranda [name]               CHART#:  [id number]   DATE:  09/24/2007                       DOB:  07-16-1934   REFERRING PHYSICIAN:  Franchot Heidelberg.   PROBLEM LIST:  1. Constipation.  2. History of lung cancer.  3. Hypertension.  4. Hyperlipidemia.   SUBJECTIVE:  Miranda [name] is a 77 year old female who was initially seen by  me for open access colonoscopy.  She was complaining of constipation on  that day.  I asked her to add MiraLax.  She has added the MiraLax and  her bowel movements are fine.  She denies any heartburn, indigestion,  nausea or vomiting.  She was okay taking the TriLyte bowel prep.  Her  daughter had adenomatous polyps.  Her 2 sisters are up to date with  their colon cancer screening.   OBJECTIVE:  Weight 183 pounds, height 5 feet, temperature 97.5, blood  pressure 130/90, pulse 80.  IN GENERAL:  She is in no acute distress, alert and oriented x4.  LUNGS:  Clear to auscultation bilaterally.  CARDIOVASCULAR EXAM:  Regular rhythm no murmur.  ABDOMEN:  Bowel sounds are present, soft, nontender, nondistended.   ASSESSMENT:  Miranda [name] is a 77 year old female whose constipation has  been relieved with MiraLax daily.  Thank you for allowing me to see [name] in [name].  My recommendations follow.   RECOMMENDATIONS:  1. She should continue the MiraLax daily.  2. Screening colonoscopy in 2012.  She should continue a high-fiber      diet, she may continue      to use the MiraLax daily.  3. She may follow up with me as an outpatient as needed.       Kassie Mends, M.D.  Electronically Signed     SM/MEDQ  D:  09/24/2007  T:  09/25/2007  Job:  [id number]   cc:   Franchot Heidelberg, M.D.

## 2011-04-14 NOTE — Op Note (Signed)
[name], [name]              ACCOUNT NO.:  [id number]   MEDICAL RECORD NO.:  [id number]          PATIENT TYPE:  AMB   LOCATION:  DAY                           FACILITY:  [facility]   PHYSICIAN:  Vickki Hearing, M.D.DATE OF BIRTH:  [birth year]   DATE OF PROCEDURE:  01/27/2005  DATE OF DISCHARGE:  01/27/2005                                 OPERATIVE REPORT   PREOPERATIVE DIAGNOSIS:  Torn medial meniscus, left knee.   POSTOPERATIVE DIAGNOSIS:  Torn medial meniscus, left knee.   PROCEDURE:  Arthroscopy, left knee, partial medial meniscectomy.   FINDINGS:  Torn medial meniscus, left knee. Mild tibial femoral arthrosis.   INDICATIONS FOR PROCEDURE:  Pain and mechanical symptoms.   SURGEON:  Dr. Romeo Apple.   ANESTHESIA:  Dr. Jayme Cloud and staff.   DETAILS OF PROCEDURE:  [name] was identified in the preoperative holding  area. The left knee was marked as the surgical site, countersigned by the  surgeon. She was given preoperative antibiotics. History and physical was  updated. She was taken to the operating room for anesthesia, and once  anesthesia was satisfactorily administered, the left knee was prepped and  draped in a sterile technique. A time out was taken and completed as  required, confirming the left knee as the surgical site.   A two-incision diagnostic arthroscopy was done on the left knee. Medial  compartment had the primary findings with torn medial meniscus at the  posterior horn. There were mild degenerative changes throughout the knee.   Using a straight duckbill forceps and a straight shaver, the meniscal tear  was resected, and the meniscus was balanced to a stable rim. Repeat probing  of the meniscus found the rim to be stable, and the knee was irrigated and  closed with Steri-Strips. We injected 30 cc of Marcaine, applied sterile  dressings along with a CryoCuff, and then took the patient back to the  recovery room in stable condition.      SEH/MEDQ  D:   02/17/2005  T:  02/17/2005  Job:  [id number]

## 2011-04-14 NOTE — H&P (Signed)
Miranda [name], Miranda [name]              ACCOUNT NO.:  [id number]   MEDICAL RECORD NO.:  [id number]          PATIENT TYPE:  AMB   LOCATION:  DAY                           FACILITY:  [facility]   PHYSICIAN:  Vickki Hearing, M.D.DATE OF BIRTH:  11/29/1933   DATE OF ADMISSION:  DATE OF DISCHARGE:  LH                                HISTORY & PHYSICAL   CHIEF COMPLAINT:  Left knee pain.   HISTORY:  A 77 year old female with left knee pain.  She had an MRI.  She  has a torn meniscus.  Her knee buckles and pops and she has trouble  ambulating.  She has failed conservative treatment after six months.  She  presents for arthroscopy of the left knee for a medial meniscectomy.   REVIEW OF SYSTEMS:  Normal.   ALLERGIES:  She has no allergies.   PAST MEDICAL HISTORY:  1.  She has hypertension.  2.  She had lung cancer treated with surgery.   MEDICATIONS:  She takes hydrochlorothiazide and Maxzide.   FAMILY HISTORY:  Unremarkable.   SOCIAL HISTORY:  She is retired.   PHYSICAL EXAMINATION:  Weight is 168 pounds.  Pulse 60, respiratory rate 16.  Pulses are intact.  No swelling or edema.  She has no popliteal lymph nodes.  Her skin was normal without scars, rashes, lesions or ulcers other than  surgical.  Her neuropsychiatric exam was normal.  She has medial joint line  tenderness.  Her motion is maintained.  Her joint is stable.  Her strength  is normal.  Meniscal signs are positive.   Upper extremities show no contracture, joint subluxation, atrophy or tremor.   DIAGNOSIS:  Torn cartilage of left knee.   PLAN:  Arthroscopy of left knee.      SEH/MEDQ  D:  01/26/2005  T:  01/26/2005  Job:  [id number]   cc:   [facility]  Fax: [phone]

## 2011-05-09 ENCOUNTER — Ambulatory Visit (INDEPENDENT_AMBULATORY_CARE_PROVIDER_SITE_OTHER): Payer: Medicare Other | Admitting: Orthopedic Surgery

## 2011-05-09 DIAGNOSIS — M5136 Other intervertebral disc degeneration, lumbar region: Secondary | ICD-10-CM

## 2011-05-09 DIAGNOSIS — M5137 Other intervertebral disc degeneration, lumbosacral region: Secondary | ICD-10-CM

## 2011-05-09 DIAGNOSIS — M5430 Sciatica, unspecified side: Secondary | ICD-10-CM

## 2011-05-09 DIAGNOSIS — M51369 Other intervertebral disc degeneration, lumbar region without mention of lumbar back pain or lower extremity pain: Secondary | ICD-10-CM

## 2011-05-09 DIAGNOSIS — M5432 Sciatica, left side: Secondary | ICD-10-CM

## 2011-05-09 NOTE — Patient Instructions (Signed)
Take medication as needed  

## 2011-05-09 NOTE — Progress Notes (Signed)
Followup visit.  Diagnosis sciatica LEFT lower extremity with degenerative disc disease.  Treatment Neurontin.  She indicates she is much improved on the medication and now takes it on an intermittent as-needed basis.  Clinical exam shows no tenderness in the back of gluteal region of the LEFT lower extremity and her angulation pattern is normal.  Recommended continue medication as needed basis area.  Followup if symptoms increase

## 2011-06-28 ENCOUNTER — Encounter (HOSPITAL_COMMUNITY): Payer: Self-pay

## 2011-06-28 ENCOUNTER — Other Ambulatory Visit (HOSPITAL_COMMUNITY): Payer: Self-pay | Admitting: Oncology

## 2011-06-28 ENCOUNTER — Ambulatory Visit (HOSPITAL_COMMUNITY): Payer: Medicare Other

## 2011-06-28 ENCOUNTER — Ambulatory Visit (HOSPITAL_COMMUNITY)
Admission: RE | Admit: 2011-06-28 | Discharge: 2011-06-28 | Disposition: A | Discharge status: Home / Self Care | Payer: Medicare Other | Source: Ambulatory Visit | Attending: Oncology | Admitting: Oncology

## 2011-06-28 ENCOUNTER — Encounter (HOSPITAL_COMMUNITY): Payer: Medicare Other | Attending: Oncology

## 2011-06-28 VITALS — BP 152/76 | HR 66 | Temp 98.0°F | Wt 176.4 lb

## 2011-06-28 DIAGNOSIS — Z85118 Personal history of other malignant neoplasm of bronchus and lung: Secondary | ICD-10-CM

## 2011-06-28 NOTE — Progress Notes (Signed)
CC:   Catalina Pizza, M.D.  HISTORY:  Miranda [name] is a 77 year old African-American woman who is being seen today at the [facility] for her yearly followup visit regarding an adenocarcinoma of the left upper lobe, well differentiated, bronchoalveolar type, stage I (T1 N0 M0) for which the patient underwent a left upper lobectomy and mediastinal lymphadenectomy in 2002 at the [facility] in [place], [place].  Margins and lymph nodes were negative.  The patient has done well.  She was last seen here by Dr. Glenford Peers on 07/06/2010.  She has had an uneventful year from a medical standpoint.  Other medical problems include mild obesity, dyslipidemia, hypertension, and a partial hysterectomy for excessive uterine bleeding in 1972.  Mrs. Renshaw is without complaints today.  She has had some aching in her shoulders and hips which seems to be chronic.  She denies any major changes in her health status over the past year.  Specifically, she denies any respiratory symptoms.  Her last chest x-ray was on 07/06/2010, was negative.  She did have mammograms on 01/17/2011, also negative.  MEDICATIONS:  Medicines are reviewed and recorded.  PHYSICAL EXAMINATION:  General:  On physical exam, Mrs. Arbogast looks well. Vital signs:  Weight today is 176.4 pounds, height 5 feet 1 inch. Weight is generally stable.  Blood pressure today 152/76.  Other vital signs are normal.  Her O2 saturation on room air at rest was 100%. HEENT:  There is no scleral icterus.  Mouth and pharynx are benign.  No peripheral adenopathy palpable.  No axillary or inguinal adenopathy. Lungs are clear.  Her scar is well-healed without evidence of chest wall recurrence.  Cardiac exam:  Regular rhythm without murmur or rub. Abdomen:  Somewhat obese, nontender, with no organomegaly or masses palpable.  Extremities:  Puffy ankles.  No clubbing.  Musculoskeletal: No areas of musculoskeletal tenderness.  LABORATORY  DATA:  None was carried out today.  We have a Sed rate from 07/06/2010 of 4.  The patient's last chest x-ray was on 07/06/2010 which showed postsurgical changes in the left chest, some mild cardiac enlargement, and no evidence for recurrent tumor.  The patient is having a chest x-ray today.  IMPRESSION AND PLAN:  Mrs. Dardis continues to do well, now 10 years from the time of diagnosis.  Surgery was carried out in 2002 at [place], [place].  At her request, the patient was scheduled to see Dr. Mariel Sleet in 1 year.  No labs were ordered today or for that visit.  The patient remains without evidence of recurrent lung cancer.    ______________________________ Samul Dada, M.D. DSM/MEDQ  D:  06/28/2011  T:  06/28/2011  Job:  [id number]

## 2011-06-28 NOTE — Patient Instructions (Signed)
[facility]  Discharge Instructions  RECOMMENDATIONS MADE BY THE CONSULTANT AND ANY TEST RESULTS WILL BE SENT TO YOUR REFERRING DOCTOR.   EXAM FINDINGS BY MD TODAY AND SIGNS AND SYMPTOMS TO REPORT TO CLINIC OR PRIMARY MD: exam normal.   INSTRUCTIONS GIVEN AND DISCUSSED: Chest X-ray today. Return to see Dr. Mariel Sleet in 1 year.   I acknowledge that I have been informed and understand all the instructions given to me and received a copy. I do not have any more questions at this time, but understand that I may call the Specialty Clinic at [facility] at [phone] during business hours should I have any further questions or need assistance in obtaining follow-up care.    __________________________________________  _____________  __________ Signature of Patient or Authorized Representative            Date                   Time    __________________________________________ Nurse's Signature

## 2011-06-28 NOTE — Progress Notes (Signed)
Progress note dictated   [phone]

## 2011-08-28 ENCOUNTER — Other Ambulatory Visit: Payer: Self-pay | Admitting: Orthopedic Surgery

## 2011-08-29 NOTE — Telephone Encounter (Signed)
Yes ok 

## 2011-10-24 ENCOUNTER — Ambulatory Visit (INDEPENDENT_AMBULATORY_CARE_PROVIDER_SITE_OTHER): Payer: Medicare Other | Admitting: Orthopedic Surgery

## 2011-10-24 ENCOUNTER — Encounter: Payer: Self-pay | Admitting: Orthopedic Surgery

## 2011-10-24 VITALS — BP 150/80 | Ht 60.0 in | Wt 174.0 lb

## 2011-10-24 DIAGNOSIS — M533 Sacrococcygeal disorders, not elsewhere classified: Secondary | ICD-10-CM

## 2011-10-24 DIAGNOSIS — M999 Biomechanical lesion, unspecified: Secondary | ICD-10-CM

## 2011-10-24 NOTE — Patient Instructions (Signed)
Normal activity    

## 2011-10-24 NOTE — Progress Notes (Signed)
LEFT hip pain.  Approximately 3 weeks ago. Patient was bowling he felt a pop in the LEFT SI joint. No radicular symptoms. Symptoms seem to be resolving. After sitting when she gets up she may feel some discomfort in her back at this time. She does take some IV gabapentin for previous radicular pain, which has worked well.  She now has some dull, aching in that area. Rates the pain 5/10 but getting better.  Review of systems denies numbness, tingling, does have some unsteadiness with LEFT knee feeling like it may want to give out. At times, but does not think she needs a brace.  Medical history and surgical history as recorded.  The exam is as follows. Vital signs are recorded and stable. Physical Exam(12) GENERAL: normal development   CDV: pulses are normal   Skin: normal  Lymph: nodes were not palpable/normal  Psychiatric: awake, alert and oriented  Neuro: normal sensation  MSK Ambulation, normal 1 Bilateral hip exam shows normal range of motion, strength, stability and alignment 2 No tenderness in the lumbar spine 3 Straight-leg raises are normal 4 Reflexes are 2+ and equal 5 . SI Joint nontender  Imaging: none  Assessment: SI Joint injury most likely with irritation of sciatic nerve improving    Plan: Resume normal activities, use heat as needed continue current medications

## 2011-12-05 ENCOUNTER — Encounter: Payer: Self-pay | Admitting: Cardiology

## 2011-12-05 ENCOUNTER — Telehealth: Payer: Self-pay

## 2011-12-05 ENCOUNTER — Ambulatory Visit (INDEPENDENT_AMBULATORY_CARE_PROVIDER_SITE_OTHER): Payer: Medicare Other | Admitting: Cardiology

## 2011-12-05 VITALS — BP 158/90 | HR 96 | Ht 61.0 in | Wt 173.0 lb

## 2011-12-05 DIAGNOSIS — R9431 Abnormal electrocardiogram [ECG] [EKG]: Secondary | ICD-10-CM

## 2011-12-05 DIAGNOSIS — R55 Syncope and collapse: Secondary | ICD-10-CM

## 2011-12-05 DIAGNOSIS — R002 Palpitations: Secondary | ICD-10-CM | POA: Diagnosis not present

## 2011-12-05 NOTE — Progress Notes (Signed)
   Clinical Summary Ms. Kamen is a 78 y.o.female referred by Dr. Margo Aye for cardiology consultation. She reports a history of intermittent dizziness and near syncope over the last few months. No definite trigger, can occur sitting or standing, not clearly with exertion. Sometimes a sense of increased heart rate as she is trying to relax with the event. Never true syncope. Episodes last for 5-10 minutes.  Laboratory data from September 2012 reviewed showing BUN 18, creatinine 0.83, AST 19, ALT 12, hemoglobin 12.7, cholesterol 211, triglycerides 78, HDL 56, LDL 139.  ECG done recently with Dr. Margo Aye showed sinus rhythm with LVH and probable anterolateral repolarization abnormalities.  No exertional chest pain or unusual dyspnea. She walks for exercise and also bowls in a league.   No Known Allergies  Current Outpatient Prescriptions  Medication Sig Dispense Refill  . aspirin (ASPIRIN LOW DOSE) 81 MG EC tablet Take 81 mg by mouth daily.        Marland Kitchen atorvastatin (LIPITOR) 10 MG tablet Take 10 mg by mouth daily.        Marland Kitchen gabapentin (NEURONTIN) 100 MG capsule TAKE ONE CAPSULE TWICE A DAY TAKE 2 CAPSULES AT BEDTIME  90 capsule  2  . olmesartan-hydrochlorothiazide (BENICAR HCT) 40-25 MG per tablet Take 1 tablet by mouth daily.        . potassium chloride SA (K-DUR,KLOR-CON) 20 MEQ tablet Take 20 mEq by mouth 2 (two) times daily.          Past Medical History  Diagnosis Date  . Essential hypertension, benign   . Mixed hyperlipidemia   . Nephrolithiasis   . Asthma     Past Surgical History  Procedure Date  . Abdominal hysterectomy     excessive bleeding, no cancer   . Left lung - lobectomy lower lobe - lung cancer   . Left knee arthroscopy due to torn ligament and cartiallege 2006    [facility]     Family History  Problem Relation Age of Onset  . Colon cancer Father   . Heart attack Mother   . Anuerysm Mother     Brain   . Stroke Sister   . Hypertension Sister   . Hypertension Sister   .  Coronary artery disease Sister   . Hypertension Son   . Cancer      Family history   . Arthritis      Family history     Social History Ms. Portier reports that she has never smoked. She does not have any smokeless tobacco history on file. Ms. Litchford reports that she drinks alcohol.  Review of Systems Negative except as outlined above.  Physical Examination Filed Vitals:   12/05/11 0907  BP: 158/90  Pulse: 96   Overweight woman in no acute distress. HEENT: Conjunctiva and lids normal, oropharynx clear with moist mucosa. Neck: Supple, no elevated JVP or carotid bruits, no thyromegaly. Lungs: Clear to auscultation, nonlabored breathing at rest. Cardiac: Regular rate and rhythm, no S3 , soft systolic murmur, no pericardial rub. Abdomen: Soft, nontender, bowel sounds present, no guarding or rebound. Extremities: No pitting edema, distal pulses 2+. Skin: Warm and dry. Musculoskeletal: No kyphosis. Neuropsychiatric: Alert and oriented x3, affect grossly appropriate.   ECG SInus rhythm with PAC's, NSST changes.   Problem List and Plan

## 2011-12-05 NOTE — Assessment & Plan Note (Signed)
Vague, not preceding above events. Further investigated with monitor as above.

## 2011-12-05 NOTE — Telephone Encounter (Signed)
**Note De-Identified Miranda [name] Obfuscation** Left message for pt. to call this office. Per Dr. Diona Browner, pt. needs to have a stress Echo as well as a Echo. Pt. was not advised at [facility] this morning.

## 2011-12-05 NOTE — Patient Instructions (Signed)
**Note De-Identified Miranda [name] Obfuscation** Your physician has requested that you have an echocardiogram. Echocardiography is a painless test that uses sound waves to create images of your heart. It provides your doctor with information about the size and shape of your heart and how well your heart's chambers and valves are working. This procedure takes approximately one hour. There are no restrictions for this procedure.  Your physician has recommended that you wear an event monitor. Event monitors are medical devices that record the heart's electrical activity. Doctors most often [place] these monitors to diagnose arrhythmias. Arrhythmias are problems with the speed or rhythm of the heartbeat. The monitor is a small, portable device. You can wear one while you do your normal daily activities. This is usually used to diagnose what is causing palpitations/syncope (passing out).  Your physician recommends that you schedule a follow-up appointment in: 1 month

## 2011-12-05 NOTE — Progress Notes (Signed)
Addended by: Demetrios Loll on: 12/05/2011 09:50 AM   Modules accepted: Orders

## 2011-12-05 NOTE — Assessment & Plan Note (Signed)
No true syncope. Etiology not clear based on description. Not orthostatic today. Need to exclude arrhythmia, particularly bradycardia or pauses. Event monitor arranged with office followup.

## 2011-12-05 NOTE — Assessment & Plan Note (Signed)
Probable repolarization abnormalities with LVH, ischemic changes less likely. Echocardiogram with stress echocardiogram also to be obtained to assess LVEF and wall thickness as well as exclude ischemia. Will review in office.

## 2011-12-06 ENCOUNTER — Telehealth: Payer: Self-pay | Admitting: Cardiology

## 2011-12-06 NOTE — Telephone Encounter (Signed)
PT RETURNING LYNN'S CALL FROM 12/05/11/TMJ

## 2011-12-06 NOTE — Telephone Encounter (Signed)
**Note De-Identified Miranda [name] Obfuscation** Pt. advised that Echo is schedule on 12-12-11 at 8:30 and the Stress Echo is scheduled on 12-14-11 at 9:30.  Stress test Instruction letter with appt. dates written at the bottom of page mailed to pt's address, pt. is aware./LV

## 2011-12-08 DIAGNOSIS — R55 Syncope and collapse: Secondary | ICD-10-CM | POA: Diagnosis not present

## 2011-12-12 ENCOUNTER — Other Ambulatory Visit (HOSPITAL_COMMUNITY): Payer: Medicare Other

## 2011-12-12 ENCOUNTER — Ambulatory Visit (HOSPITAL_COMMUNITY): Payer: Medicare Other

## 2011-12-13 ENCOUNTER — Ambulatory Visit (HOSPITAL_COMMUNITY)
Admission: RE | Admit: 2011-12-13 | Discharge: 2011-12-13 | Disposition: A | Discharge status: Home / Self Care | Payer: Medicare Other | Source: Ambulatory Visit | Attending: Cardiology | Admitting: Cardiology

## 2011-12-13 DIAGNOSIS — E785 Hyperlipidemia, unspecified: Secondary | ICD-10-CM | POA: Diagnosis not present

## 2011-12-13 DIAGNOSIS — R55 Syncope and collapse: Secondary | ICD-10-CM | POA: Diagnosis not present

## 2011-12-13 DIAGNOSIS — I369 Nonrheumatic tricuspid valve disorder, unspecified: Secondary | ICD-10-CM | POA: Diagnosis not present

## 2011-12-13 DIAGNOSIS — R002 Palpitations: Secondary | ICD-10-CM | POA: Insufficient documentation

## 2011-12-13 NOTE — Progress Notes (Signed)
*  PRELIMINARY RESULTS* Echocardiogram 2D Echocardiogram has been performed.  Conrad Moonachie 12/13/2011, 11:18 AM

## 2011-12-14 ENCOUNTER — Ambulatory Visit (HOSPITAL_COMMUNITY)
Admission: RE | Admit: 2011-12-14 | Discharge: 2011-12-14 | Payer: Medicare Other | Source: Ambulatory Visit | Attending: Cardiology | Admitting: Cardiology

## 2011-12-14 ENCOUNTER — Other Ambulatory Visit (HOSPITAL_COMMUNITY): Payer: Medicare Other

## 2011-12-14 ENCOUNTER — Ambulatory Visit (HOSPITAL_COMMUNITY): Admission: RE | Admit: 2011-12-14 | Payer: Medicare Other | Source: Ambulatory Visit

## 2011-12-14 DIAGNOSIS — R55 Syncope and collapse: Secondary | ICD-10-CM

## 2011-12-14 DIAGNOSIS — R9431 Abnormal electrocardiogram [ECG] [EKG]: Secondary | ICD-10-CM

## 2011-12-20 ENCOUNTER — Ambulatory Visit (HOSPITAL_COMMUNITY)
Admission: RE | Admit: 2011-12-20 | Discharge: 2011-12-20 | Disposition: A | Discharge status: Home / Self Care | Payer: Medicare Other | Source: Ambulatory Visit | Attending: Cardiology | Admitting: Cardiology

## 2011-12-20 DIAGNOSIS — R002 Palpitations: Secondary | ICD-10-CM | POA: Diagnosis not present

## 2011-12-20 DIAGNOSIS — E785 Hyperlipidemia, unspecified: Secondary | ICD-10-CM | POA: Diagnosis not present

## 2011-12-20 DIAGNOSIS — R55 Syncope and collapse: Secondary | ICD-10-CM | POA: Diagnosis not present

## 2011-12-20 NOTE — Progress Notes (Signed)
Stress Lab Nurses Notes - [facility]  MARGAUX ENGEN 12/20/2011  Reason for doing test: Arrhythmia/Palpitations and Syncope Type of test: Stress Echo Nurse performing test: Parke Poisson, RN Nuclear Medicine Tech: Not Applicable Echo Tech: Karrie Doffing MD performing test: Ival Bible & Joni Reining NP Family MD: Margo Aye Test explained and consent signed: yes IV started: No IV started Symptoms: fatigue Treatment/Intervention: None Reason test stopped: fatigue and reached target HR After recovery IV was: NA Patient to return to Nuc. Med at : NA Patient discharged: Home Patient's Condition upon discharge was: stable Comments: During test peak BP 192/90 & HR 159.  Recovery BP 142/82 & HR 80.  Symptoms resolved in recovery. Erskine Speed T

## 2011-12-20 NOTE — Progress Notes (Signed)
*  PRELIMINARY RESULTS* Echocardiogram Echocardiogram Stress Test has been performed.  Conrad Lacey 12/20/2011, 10:44 AM

## 2011-12-25 DIAGNOSIS — H40039 Anatomical narrow angle, unspecified eye: Secondary | ICD-10-CM | POA: Diagnosis not present

## 2011-12-25 DIAGNOSIS — H02409 Unspecified ptosis of unspecified eyelid: Secondary | ICD-10-CM | POA: Diagnosis not present

## 2011-12-25 DIAGNOSIS — H02839 Dermatochalasis of unspecified eye, unspecified eyelid: Secondary | ICD-10-CM | POA: Diagnosis not present

## 2011-12-25 DIAGNOSIS — H04129 Dry eye syndrome of unspecified lacrimal gland: Secondary | ICD-10-CM | POA: Diagnosis not present

## 2011-12-26 ENCOUNTER — Other Ambulatory Visit: Payer: Self-pay | Admitting: *Deleted

## 2011-12-26 ENCOUNTER — Other Ambulatory Visit: Payer: Self-pay | Admitting: Cardiology

## 2011-12-26 DIAGNOSIS — R55 Syncope and collapse: Secondary | ICD-10-CM

## 2011-12-26 MED ORDER — METOPROLOL SUCCINATE ER 25 MG PO TB24: 25.0000 mg | ORAL_TABLET | Freq: Every day | ORAL | Status: DC

## 2011-12-26 NOTE — Telephone Encounter (Signed)
Per Dr Diona Browner, advised patient to start on Toprol XL 25 mg daily and keep upcomming appointment on 01/05/12.  Verbalized understanding.

## 2012-01-03 DIAGNOSIS — H04129 Dry eye syndrome of unspecified lacrimal gland: Secondary | ICD-10-CM | POA: Diagnosis not present

## 2012-01-03 DIAGNOSIS — H409 Unspecified glaucoma: Secondary | ICD-10-CM | POA: Diagnosis not present

## 2012-01-03 DIAGNOSIS — H402290 Chronic angle-closure glaucoma, unspecified eye, stage unspecified: Secondary | ICD-10-CM | POA: Diagnosis not present

## 2012-01-05 ENCOUNTER — Encounter: Payer: Self-pay | Admitting: Cardiology

## 2012-01-05 ENCOUNTER — Ambulatory Visit: Payer: Medicare Other | Admitting: Cardiology

## 2012-01-05 ENCOUNTER — Ambulatory Visit (INDEPENDENT_AMBULATORY_CARE_PROVIDER_SITE_OTHER): Payer: Medicare Other | Admitting: Cardiology

## 2012-01-05 VITALS — BP 153/83 | HR 61 | Ht 61.0 in | Wt 175.0 lb

## 2012-01-05 DIAGNOSIS — I10 Essential (primary) hypertension: Secondary | ICD-10-CM

## 2012-01-05 DIAGNOSIS — E785 Hyperlipidemia, unspecified: Secondary | ICD-10-CM

## 2012-01-05 DIAGNOSIS — I471 Supraventricular tachycardia: Secondary | ICD-10-CM | POA: Diagnosis not present

## 2012-01-05 DIAGNOSIS — R9431 Abnormal electrocardiogram [ECG] [EKG]: Secondary | ICD-10-CM | POA: Diagnosis not present

## 2012-01-05 NOTE — Assessment & Plan Note (Signed)
No clear ischemia by exercise echocardiogram.

## 2012-01-05 NOTE — Assessment & Plan Note (Signed)
Plan to continue observation on Toprol XL. Continue regular exercise. We discussed vagal manuevers. Followup arranged.

## 2012-01-05 NOTE — Patient Instructions (Signed)
Your physician recommends that you continue on your current medications as directed. Please refer to the Current Medication list given to you today.  Your physician recommends that you schedule a follow-up appointment in: 4 months  

## 2012-01-05 NOTE — Assessment & Plan Note (Signed)
Keep followup visit with Dr. Margo Aye in March.

## 2012-01-05 NOTE — Progress Notes (Signed)
   Clinical Summary Miranda [name] is a 78 y.o.female presenting for followup. She was seen in January. Since last visit we have started her on Toprol XL secondary to finding of PSVT on monitoring. She has tolerated this well so far without progressive symptoms.  She also underwent a stress echocardiogram that showed no echocardiographic evidence of ischemia. ECG abnormalities are likely repolarization changes.  We discussed testing and plan for observation on medical therapy.   No Known Allergies  Current Outpatient Prescriptions  Medication Sig Dispense Refill  . aspirin (ASPIRIN LOW DOSE) 81 MG EC tablet Take 81 mg by mouth daily.        Marland Kitchen atorvastatin (LIPITOR) 10 MG tablet Take 10 mg by mouth daily.        Marland Kitchen gabapentin (NEURONTIN) 100 MG capsule TAKE ONE CAPSULE TWICE A DAY TAKE 2 CAPSULES AT BEDTIME  90 capsule  2  . metoprolol succinate (TOPROL-XL) 25 MG 24 hr tablet Take 1 tablet (25 mg total) by mouth daily.  30 tablet  12  . olmesartan-hydrochlorothiazide (BENICAR HCT) 40-25 MG per tablet Take 1 tablet by mouth daily.        . potassium chloride SA (K-DUR,KLOR-CON) 20 MEQ tablet Take 20 mEq by mouth 2 (two) times daily.          Past Medical History  Diagnosis Date  . Essential hypertension, benign   . Mixed hyperlipidemia   . Nephrolithiasis   . Asthma     Social History Miranda [name] reports that she has never smoked. She does not have any smokeless tobacco history on file. Miranda [name] reports that she drinks alcohol.  Review of Systems Negative except as outlined.  Physical Examination Filed Vitals:   01/05/12 0910  BP: 153/83  Pulse: 61   Overweight woman in no acute distress.  HEENT: Conjunctiva and lids normal, oropharynx clear with moist mucosa.  Neck: Supple, no elevated JVP or carotid bruits, no thyromegaly.  Lungs: Clear to auscultation, nonlabored breathing at rest.  Cardiac: Regular rate and rhythm, no S3 , soft systolic murmur, no pericardial rub.  Abdomen:  Soft, nontender, bowel sounds present, no guarding or rebound.  Extremities: No pitting edema, distal pulses 2+.  Skin: Warm and dry.  Musculoskeletal: No kyphosis.  Neuropsychiatric: Alert and oriented x3, affect grossly appropriate.     Problem List and Plan

## 2012-01-05 NOTE — Assessment & Plan Note (Signed)
No change to current regimen. 

## 2012-01-10 ENCOUNTER — Other Ambulatory Visit (HOSPITAL_COMMUNITY): Payer: Self-pay | Admitting: Internal Medicine

## 2012-01-10 DIAGNOSIS — Z139 Encounter for screening, unspecified: Secondary | ICD-10-CM

## 2012-01-22 ENCOUNTER — Ambulatory Visit (HOSPITAL_COMMUNITY)
Admission: RE | Admit: 2012-01-22 | Discharge: 2012-01-22 | Disposition: A | Discharge status: Home / Self Care | Payer: Medicare Other | Source: Ambulatory Visit | Attending: Internal Medicine | Admitting: Internal Medicine

## 2012-01-22 DIAGNOSIS — Z1231 Encounter for screening mammogram for malignant neoplasm of breast: Secondary | ICD-10-CM | POA: Insufficient documentation

## 2012-01-22 DIAGNOSIS — Z139 Encounter for screening, unspecified: Secondary | ICD-10-CM

## 2012-01-31 DIAGNOSIS — H40039 Anatomical narrow angle, unspecified eye: Secondary | ICD-10-CM | POA: Diagnosis not present

## 2012-02-14 DIAGNOSIS — H402290 Chronic angle-closure glaucoma, unspecified eye, stage unspecified: Secondary | ICD-10-CM | POA: Diagnosis not present

## 2012-02-14 DIAGNOSIS — H2000 Unspecified acute and subacute iridocyclitis: Secondary | ICD-10-CM | POA: Diagnosis not present

## 2012-02-14 DIAGNOSIS — H02839 Dermatochalasis of unspecified eye, unspecified eyelid: Secondary | ICD-10-CM | POA: Diagnosis not present

## 2012-02-14 DIAGNOSIS — H409 Unspecified glaucoma: Secondary | ICD-10-CM | POA: Diagnosis not present

## 2012-02-22 DIAGNOSIS — I10 Essential (primary) hypertension: Secondary | ICD-10-CM | POA: Diagnosis not present

## 2012-02-22 DIAGNOSIS — E785 Hyperlipidemia, unspecified: Secondary | ICD-10-CM | POA: Diagnosis not present

## 2012-03-07 DIAGNOSIS — H35129 Retinopathy of prematurity, stage 1, unspecified eye: Secondary | ICD-10-CM | POA: Diagnosis not present

## 2012-03-07 DIAGNOSIS — H04129 Dry eye syndrome of unspecified lacrimal gland: Secondary | ICD-10-CM | POA: Diagnosis not present

## 2012-03-07 DIAGNOSIS — H02409 Unspecified ptosis of unspecified eyelid: Secondary | ICD-10-CM | POA: Diagnosis not present

## 2012-03-07 DIAGNOSIS — H409 Unspecified glaucoma: Secondary | ICD-10-CM | POA: Diagnosis not present

## 2012-03-07 DIAGNOSIS — H02839 Dermatochalasis of unspecified eye, unspecified eyelid: Secondary | ICD-10-CM | POA: Diagnosis not present

## 2012-03-22 DIAGNOSIS — H02409 Unspecified ptosis of unspecified eyelid: Secondary | ICD-10-CM | POA: Diagnosis not present

## 2012-03-22 DIAGNOSIS — H02839 Dermatochalasis of unspecified eye, unspecified eyelid: Secondary | ICD-10-CM | POA: Diagnosis not present

## 2012-03-22 DIAGNOSIS — H409 Unspecified glaucoma: Secondary | ICD-10-CM | POA: Diagnosis not present

## 2012-03-22 DIAGNOSIS — H402290 Chronic angle-closure glaucoma, unspecified eye, stage unspecified: Secondary | ICD-10-CM | POA: Diagnosis not present

## 2012-03-22 DIAGNOSIS — H04129 Dry eye syndrome of unspecified lacrimal gland: Secondary | ICD-10-CM | POA: Diagnosis not present

## 2012-06-05 DIAGNOSIS — I10 Essential (primary) hypertension: Secondary | ICD-10-CM | POA: Diagnosis not present

## 2012-06-05 DIAGNOSIS — E785 Hyperlipidemia, unspecified: Secondary | ICD-10-CM | POA: Diagnosis not present

## 2012-06-05 DIAGNOSIS — E782 Mixed hyperlipidemia: Secondary | ICD-10-CM | POA: Diagnosis not present

## 2012-06-05 DIAGNOSIS — R609 Edema, unspecified: Secondary | ICD-10-CM | POA: Diagnosis not present

## 2012-06-17 DIAGNOSIS — H409 Unspecified glaucoma: Secondary | ICD-10-CM | POA: Diagnosis not present

## 2012-06-17 DIAGNOSIS — H2510 Age-related nuclear cataract, unspecified eye: Secondary | ICD-10-CM | POA: Diagnosis not present

## 2012-06-17 DIAGNOSIS — H04129 Dry eye syndrome of unspecified lacrimal gland: Secondary | ICD-10-CM | POA: Diagnosis not present

## 2012-06-17 DIAGNOSIS — H402290 Chronic angle-closure glaucoma, unspecified eye, stage unspecified: Secondary | ICD-10-CM | POA: Diagnosis not present

## 2012-06-27 ENCOUNTER — Encounter (HOSPITAL_COMMUNITY): Payer: Medicare Other | Attending: Oncology | Admitting: Oncology

## 2012-06-27 VITALS — BP 129/79 | HR 76 | Temp 98.3°F | Wt 176.7 lb

## 2012-06-27 DIAGNOSIS — C3490 Malignant neoplasm of unspecified part of unspecified bronchus or lung: Secondary | ICD-10-CM

## 2012-06-27 DIAGNOSIS — C3410 Malignant neoplasm of upper lobe, unspecified bronchus or lung: Secondary | ICD-10-CM

## 2012-06-27 NOTE — Progress Notes (Signed)
Problem #1 left upper lobe well-differentiated bronchoalveolar carcinoma stage I (T1, N0, M0) status post surgical resection on the left with a left upper lobectomy in 2002 at [facility] in [place]. Problem 2 mild excessive weight weighing 176 pounds a 5 foot 1 inch frame. Problem #3 hypercholesterolemia Problem #4 hypertension Problem #5 history of partial hysterectomy for excessive uterine bleeding in 1972  Since shows here last year she has done very well. She had a physical exam with Dr. Margo Aye 2 weeks ago and her blood work she states was right where he wanted. We will not repeat bloodwork year. She has not had a CT scan several years but I do think she should have one every 2-3 years. She has no pulmonary symptoms she does however have tenderness of that scar at times which is not new or different. Bowels are working well no blood in her stool no blood in her urine no loss of appetite etc.  Vital signs are stable. She has a weight of 176 pounds today down from her peak of 184 pounds in 2009. She has no lymphadenopathy. The left chest scar is well-healed. Her lungs are clear. She has no thyromegaly. Her breast exam is negative for masses. Her heart shows a regular rhythm and rate without murmur or gallop. Abdomen soft and nontender without hepatosplenomegaly. She has no arm or leg edema. She is in no acute distress. She is alert and very oriented. Bowel sounds are normal.  I will order a CAT scan of her chest and see her back in a year falls well. She knows to call sooner if need be

## 2012-06-27 NOTE — Patient Instructions (Signed)
[facility]  Discharge Instructions Miranda [name]  [id number] 05-18-1934 Dr. Glenford Peers    RECOMMENDATIONS MADE BY THE CONSULTANT AND ANY TEST RESULTS WILL BE SENT TO YOUR REFERRING DOCTOR.   EXAM FINDINGS BY MD TODAY AND SIGNS AND SYMPTOMS TO REPORT TO CLINIC OR PRIMARY MD:   SPECIAL INSTRUCTIONS/FOLLOW-UP: Xray Studies Needed:  CT scan of the chest without contrast in the next couple weeks and Return to Clinic in one year.  Please see the front desk for appointments as you leave.  Call us with any concerns.  If you have not heard from [place] by August 20 with the results from your CT scan, please give [place] a call.     I acknowledge that I have been informed and understand all the instructions given to me and received a copy. I do not have any more questions at this time, but understand that I may call the Specialty Clinic at [facility] at [phone] during business hours should I have any further questions or need assistance in obtaining follow-up care.    __________________________________________  _____________  __________ Signature of Patient or Authorized Representative            Date                   Time    __________________________________________ Nurse's Signature

## 2012-06-28 ENCOUNTER — Ambulatory Visit (HOSPITAL_COMMUNITY): Payer: Medicare Other | Admitting: Oncology

## 2012-07-02 ENCOUNTER — Ambulatory Visit (HOSPITAL_COMMUNITY): Payer: Medicare Other

## 2012-07-05 ENCOUNTER — Ambulatory Visit (HOSPITAL_COMMUNITY)
Admission: RE | Admit: 2012-07-05 | Discharge: 2012-07-05 | Disposition: A | Discharge status: Home / Self Care | Payer: Medicare Other | Source: Ambulatory Visit | Attending: Oncology | Admitting: Oncology

## 2012-07-05 DIAGNOSIS — C3490 Malignant neoplasm of unspecified part of unspecified bronchus or lung: Secondary | ICD-10-CM | POA: Insufficient documentation

## 2012-07-10 ENCOUNTER — Encounter: Payer: Self-pay | Admitting: Gastroenterology

## 2012-08-06 ENCOUNTER — Encounter: Payer: Self-pay | Admitting: *Deleted

## 2012-12-18 DIAGNOSIS — H409 Unspecified glaucoma: Secondary | ICD-10-CM | POA: Diagnosis not present

## 2012-12-18 DIAGNOSIS — H04129 Dry eye syndrome of unspecified lacrimal gland: Secondary | ICD-10-CM | POA: Diagnosis not present

## 2012-12-18 DIAGNOSIS — H402290 Chronic angle-closure glaucoma, unspecified eye, stage unspecified: Secondary | ICD-10-CM | POA: Diagnosis not present

## 2012-12-20 ENCOUNTER — Other Ambulatory Visit (HOSPITAL_COMMUNITY): Payer: Self-pay | Admitting: Internal Medicine

## 2012-12-20 DIAGNOSIS — Z139 Encounter for screening, unspecified: Secondary | ICD-10-CM

## 2013-01-22 ENCOUNTER — Other Ambulatory Visit: Payer: Self-pay | Admitting: Cardiology

## 2013-01-27 ENCOUNTER — Ambulatory Visit (HOSPITAL_COMMUNITY)
Admission: RE | Admit: 2013-01-27 | Discharge: 2013-01-27 | Disposition: A | Discharge status: Home / Self Care | Payer: Medicare Other | Source: Ambulatory Visit | Attending: Internal Medicine | Admitting: Internal Medicine

## 2013-01-27 DIAGNOSIS — Z1231 Encounter for screening mammogram for malignant neoplasm of breast: Secondary | ICD-10-CM | POA: Insufficient documentation

## 2013-01-27 DIAGNOSIS — Z139 Encounter for screening, unspecified: Secondary | ICD-10-CM

## 2013-01-30 DIAGNOSIS — I498 Other specified cardiac arrhythmias: Secondary | ICD-10-CM | POA: Diagnosis not present

## 2013-01-30 DIAGNOSIS — R002 Palpitations: Secondary | ICD-10-CM | POA: Diagnosis not present

## 2013-01-30 DIAGNOSIS — I471 Supraventricular tachycardia: Secondary | ICD-10-CM | POA: Diagnosis not present

## 2013-01-30 DIAGNOSIS — D2330 Other benign neoplasm of skin of unspecified part of face: Secondary | ICD-10-CM | POA: Diagnosis not present

## 2013-01-30 DIAGNOSIS — E785 Hyperlipidemia, unspecified: Secondary | ICD-10-CM | POA: Diagnosis not present

## 2013-06-18 DIAGNOSIS — H402290 Chronic angle-closure glaucoma, unspecified eye, stage unspecified: Secondary | ICD-10-CM | POA: Diagnosis not present

## 2013-06-18 DIAGNOSIS — H2510 Age-related nuclear cataract, unspecified eye: Secondary | ICD-10-CM | POA: Diagnosis not present

## 2013-06-18 DIAGNOSIS — H04129 Dry eye syndrome of unspecified lacrimal gland: Secondary | ICD-10-CM | POA: Diagnosis not present

## 2013-06-18 DIAGNOSIS — H409 Unspecified glaucoma: Secondary | ICD-10-CM | POA: Diagnosis not present

## 2013-06-27 ENCOUNTER — Ambulatory Visit (HOSPITAL_COMMUNITY): Payer: Medicare Other

## 2013-07-03 ENCOUNTER — Encounter (HOSPITAL_COMMUNITY): Payer: Medicare Other | Attending: Internal Medicine

## 2013-07-03 VITALS — BP 146/87 | HR 64 | Temp 97.4°F | Resp 20 | Wt 181.9 lb

## 2013-07-03 DIAGNOSIS — Z85118 Personal history of other malignant neoplasm of bronchus and lung: Secondary | ICD-10-CM

## 2013-07-03 NOTE — Progress Notes (Signed)
[facility] OFFICE PROGRESS NOTE  Catalina Pizza, MD [place] [address]  DIAGNOSIS:  HX OF BRONCHOALVEOLAR CANCER, EXCISED 2002, RETURNS FOR YRSLY F/U, HAS CT 1 YR AGO, PLANNING Q 2 YRS  CURRENT THERAPY: NONE   INTERVAL HISTORY: Miranda [name] 79 y.o. female returns for regular visit for followup    MEDICAL HISTORY: Past Medical History  Diagnosis Date  . Essential hypertension, benign   . Mixed hyperlipidemia   . Nephrolithiasis   . Asthma     INTERIM HISTORY  NO ACUTE ISSUES, 1 YR F/U, UP TO DATE WITH PMD   ALLERGIES:  has No Known Allergies.  MEDICATIONS: [name] had no medications administered during this visit.  SURGICAL HISTORY:  Past Surgical History  Procedure Laterality Date  . Abdominal hysterectomy      excessive bleeding, no cancer   . Left lung - lobectomy lower lobe - lung cancer    . Left knee arthroscopy due to torn ligament and cartiallege  2006    [facility]     REVIEW OF SYSTEMS:  Respiratory: positive for dyspnea on exertion Cardiovascular: positive for lower extremity edema Gastrointestinal: negative Genitourinary:negative Musculoskeletal:negative Neurological: negative   PHYSICAL EXAMINATION: ECOG PERFORMANCE STATUS: 0 - Asymptomatic  Filed Vitals:   07/03/13 1127  BP: 146/87  Pulse: 64  Temp: 97.4 F (36.3 C)  Resp: 20    GENERAL: No distress, well nourished.  HEAD: Normocephalic,   LYMPH: No palpable lymphadenopathy , neck, supraclav LUNGS: Clear to auscultation, no crackles or wheezes HEART: Regular rate & rhythm,   ABDOMEN: Abdomen soft, non-tender, , no masses or organomegaly and no hepatosplenomegaly  EXTREMITIES: Trace ankle edema, no skin discoloration or tenderness NEURO: Alert & oriented, no focal motor/sensory deficits.    LABORATORY DATA: No results found for this or any previous visit (from the past 48 hour(s)).    RADIOGRAPHIC STUDIES: Mm Digital Screening   Clinical Data: Lung cancer. Left  lobectomy in 2002.  CT CHEST WITHOUT CONTRAST  Technique: Multidetector CT imaging of the chest was performed  following the standard protocol without IV contrast.  Comparison: 06/28/2011  Findings: Postoperative findings left lower lobectomy noted.  No pathologic thoracic adenopathy observed. A 2 mm subpleural  lymph node is present along the right major fissure, image 33 of  series 3, and appears benign. No specific findings of recurrent  malignancy.  Thoracic spondylosis noted.  IMPRESSION:  1. Thoracic spondylosis.  2. Prior left lower lobectomy.  3. No specific findings of recurrent malignancy.  Original Report Authenticated By: Dellia Cloud, M.D.     ASSESSMENT:  STABLE, NO NEW ISSUES, SOME SOBOE AND MILD EDEMA RESPONDS TO ELEVATION, IS CHRONIC, STABLE, HAS BEEN FOLLOWED BY PRIMARY CARE. CT NEG 1 YR AGO. NO SIGNS OR SYMPTOMS TO SUGGEST RECURRENT CANCER.   PATIENT OFFERED RELEASE FROM CLINIC, WANT TO CONTINUE YEARLY F/U   PLAN:  F/U 1 YR, REPEAT CT AT THAT TIME, APPROX Q 2 YRS   All questions were answered. The patient knows to call the clinic with any problems, questions or concerns. We can certainly see the patient much sooner if necessary.       Marin Roberts, MD 07/03/2013 11:52 AM

## 2013-07-03 NOTE — Patient Instructions (Signed)
[facility] Discharge Instructions  RECOMMENDATIONS MADE BY THE CONSULTANT AND ANY TEST RESULTS WILL BE SENT TO YOUR REFERRING PHYSICIAN.  EXAM FINDINGS BY THE PHYSICIAN TODAY AND SIGNS OR SYMPTOMS TO REPORT TO CLINIC OR PRIMARY PHYSICIAN: Exam findings as discussed by Dr. Lorre Nick.  Please feel free to contact our office if you have further questions related to your care.  Thank you!  SPECIAL INSTRUCTIONS/FOLLOW-UP: 1.  Please keep your follow-up appointment in 1 year.  Thank you for choosing [facility] to provide your oncology and hematology care.  To afford each patient quality time with our providers, please arrive at least 15 minutes before your scheduled appointment time.  With your help, our goal is to use those 15 minutes to complete the necessary work-up to ensure our physicians have the information they need to help with your evaluation and healthcare recommendations.    Effective January 1st, 2014, we ask that you re-schedule your appointment with our physicians should you arrive 10 or more minutes late for your appointment.  We strive to give you quality time with our providers, and arriving late affects you and other patients whose appointments are after yours.    Again, thank you for choosing [facility].  Our hope is that these requests will decrease the amount of time that you wait before being seen by our physicians.       _____________________________________________________________  Should you have questions after your visit to [facility], please contact our office at [phone] between the hours of 8:30 a.m. and 5:00 p.m.  Voicemails left after 4:30 p.m. will not be returned until the following business day.  For prescription refill requests, have your pharmacy contact our office with your prescription refill request.

## 2013-07-22 DIAGNOSIS — E785 Hyperlipidemia, unspecified: Secondary | ICD-10-CM | POA: Diagnosis not present

## 2013-07-22 DIAGNOSIS — I10 Essential (primary) hypertension: Secondary | ICD-10-CM | POA: Diagnosis not present

## 2013-07-22 DIAGNOSIS — R609 Edema, unspecified: Secondary | ICD-10-CM | POA: Diagnosis not present

## 2013-07-22 DIAGNOSIS — N318 Other neuromuscular dysfunction of bladder: Secondary | ICD-10-CM | POA: Diagnosis not present

## 2013-07-22 DIAGNOSIS — E782 Mixed hyperlipidemia: Secondary | ICD-10-CM | POA: Diagnosis not present

## 2013-07-22 DIAGNOSIS — Z0000 Encounter for general adult medical examination without abnormal findings: Secondary | ICD-10-CM | POA: Diagnosis not present

## 2013-09-26 DIAGNOSIS — Z23 Encounter for immunization: Secondary | ICD-10-CM | POA: Diagnosis not present

## 2013-10-03 DIAGNOSIS — J069 Acute upper respiratory infection, unspecified: Secondary | ICD-10-CM | POA: Diagnosis not present

## 2014-01-07 DIAGNOSIS — H2510 Age-related nuclear cataract, unspecified eye: Secondary | ICD-10-CM | POA: Diagnosis not present

## 2014-01-07 DIAGNOSIS — H402290 Chronic angle-closure glaucoma, unspecified eye, stage unspecified: Secondary | ICD-10-CM | POA: Diagnosis not present

## 2014-01-07 DIAGNOSIS — H409 Unspecified glaucoma: Secondary | ICD-10-CM | POA: Diagnosis not present

## 2014-03-12 ENCOUNTER — Other Ambulatory Visit (HOSPITAL_COMMUNITY): Payer: Self-pay | Admitting: Internal Medicine

## 2014-03-12 DIAGNOSIS — Z139 Encounter for screening, unspecified: Secondary | ICD-10-CM

## 2014-03-12 DIAGNOSIS — Z1231 Encounter for screening mammogram for malignant neoplasm of breast: Secondary | ICD-10-CM

## 2014-03-13 ENCOUNTER — Ambulatory Visit (HOSPITAL_COMMUNITY)
Admission: RE | Admit: 2014-03-13 | Discharge: 2014-03-13 | Disposition: A | Discharge status: Home / Self Care | Payer: Medicare Other | Source: Ambulatory Visit | Attending: Internal Medicine | Admitting: Internal Medicine

## 2014-03-13 DIAGNOSIS — Z1231 Encounter for screening mammogram for malignant neoplasm of breast: Secondary | ICD-10-CM | POA: Insufficient documentation

## 2014-06-24 DIAGNOSIS — H02409 Unspecified ptosis of unspecified eyelid: Secondary | ICD-10-CM | POA: Diagnosis not present

## 2014-06-24 DIAGNOSIS — H409 Unspecified glaucoma: Secondary | ICD-10-CM | POA: Diagnosis not present

## 2014-06-24 DIAGNOSIS — H02839 Dermatochalasis of unspecified eye, unspecified eyelid: Secondary | ICD-10-CM | POA: Diagnosis not present

## 2014-06-24 DIAGNOSIS — H5200 Hypermetropia, unspecified eye: Secondary | ICD-10-CM | POA: Diagnosis not present

## 2014-06-24 DIAGNOSIS — H402290 Chronic angle-closure glaucoma, unspecified eye, stage unspecified: Secondary | ICD-10-CM | POA: Diagnosis not present

## 2014-07-03 ENCOUNTER — Encounter (HOSPITAL_COMMUNITY): Payer: Self-pay

## 2014-07-03 ENCOUNTER — Encounter (HOSPITAL_COMMUNITY): Payer: Medicare Other | Attending: Hematology and Oncology

## 2014-07-03 VITALS — BP 121/71 | HR 69 | Temp 98.8°F | Resp 16 | Wt 179.0 lb

## 2014-07-03 DIAGNOSIS — Z85118 Personal history of other malignant neoplasm of bronchus and lung: Secondary | ICD-10-CM | POA: Diagnosis not present

## 2014-07-03 DIAGNOSIS — I471 Supraventricular tachycardia: Secondary | ICD-10-CM

## 2014-07-03 DIAGNOSIS — I10 Essential (primary) hypertension: Secondary | ICD-10-CM | POA: Diagnosis not present

## 2014-07-03 NOTE — Progress Notes (Signed)
[place]  OFFICE PROGRESS NOTE  [facility], MD [place] [address]  DIAGNOSIS: Hx of cancer of lung - Plan: CBC with Differential, Comprehensive metabolic panel, Lactate dehydrogenase, CT Chest W Contrast  PSVT (paroxysmal supraventricular tachycardia)  Essential hypertension, benign  Chief Complaint  Patient presents with  . Bronchoalveolar lung cancer    Left upper lobectomy 2002    CURRENT THERAPY: Watchful expectation for bronchoalveolar lung cancer excised in 2002, last CT scan done 2 years.  INTERVAL HISTORY: Miranda [name] 80 y.o. female returns for followup of stage I probably a lung cancer excised in 2002 and followed expectantly since.  She continues to do well  withexcellent appetite. She denies any nausea, vomiting, diarrhea, constipation, melena, hematochezia, hematuria, chest pain, shortness of breath, cough, wheezing, hemoptysis, lower extremity swelling or redness, headache, skin rash, or seizures.  MEDICAL HISTORY: Past Medical History  Diagnosis Date  . Essential hypertension, benign   . Mixed hyperlipidemia   . Nephrolithiasis   . Asthma     INTERIM HISTORY: has HYPERLIPIDEMIA; Essential hypertension, benign; Near syncope; Palpitations; Abnormal ECG; and PSVT (paroxysmal supraventricular tachycardia) on her problem list.   Well-differentiated bronchoalveolar carcinoma stage I (T1, N0, M0) status post surgical resection on the left with a left upper lobectomy in 2002 at [facility] in [place]:  is allergic to bee venom.  MEDICATIONS: has a current medication list which includes the following prescription(s): amlodipine-valsartan-hctz, aspirin, atorvastatin, metoprolol succinate, and potassium chloride.  SURGICAL HISTORY:  Past Surgical History  Procedure Laterality Date  . Abdominal hysterectomy      excessive bleeding, no cancer   . Left lung - lobectomy lower lobe - lung  cancer    . Left knee arthroscopy due to torn ligament and cartiallege  2006    [facility]     FAMILY HISTORY: family history includes Anuerysm in her mother; Arthritis in an other family member; Cancer in an other family member; Colon cancer in her father; Coronary artery disease in her sister; Heart attack in her mother; Hypertension in her sister, sister, and son; Stroke in her sister.  SOCIAL HISTORY:  reports that she has never smoked. She does not have any smokeless tobacco history on file. She reports that she drinks alcohol. She reports that she does not use illicit drugs.  REVIEW OF SYSTEMS:  Other than that discussed above is noncontributory.  PHYSICAL EXAMINATION: ECOG PERFORMANCE STATUS: 0 - Asymptomatic  Blood pressure 121/71, pulse 69, temperature 98.8 F (37.1 C), temperature source Oral, resp. rate 16, weight 179 lb (81.194 kg).  GENERAL:alert, no distress and comfortable. Moderately obese. SKIN: skin color, texture, turgor are normal, no rashes or significant lesions EYES: PERLA; Conjunctiva are pink and non-injected, sclera clear SINUSES: No redness or tenderness over maxillary or ethmoid sinuses OROPHARYNX:no exudate, no erythema on lips, buccal mucosa, or tongue. NECK: supple, thyroid normal size, non-tender, without nodularity. No masses CHEST: Normal AP diameter with surgical well-healed. LYMPH:  no palpable lymphadenopathy in the cervical, axillary or inguinal LUNGS: clear to auscultation and percussion with normal breathing effort HEART: regular rate & rhythm and no murmurs. ABDOMEN:abdomen soft, non-tender and normal bowel sounds MUSCULOSKELETAL:no cyanosis of digits and no clubbing. Range of motion normal.  NEURO: alert & oriented x 3 with fluent speech, no focal motor/sensory deficits   LABORATORY DATA: No visits with results within 30 Day(s) from this visit. Latest known visit with results is:  Hospital Outpatient Visit on 07/06/2010  Component Date  Value Ref Range Status  . Sed Rate 07/06/2010 4  0 - 22 mm/hr Final    PATHOLOGY: Bronchoalveolar carcinoma.  Urinalysis    Component Value Date/Time   COLORURINE yellow 03/24/2008 0812   APPEARANCEUR Clear 03/24/2008 0812   LABSPEC 1.015 03/24/2008 0812   PHURINE 6.5 03/24/2008 0812   HGBUR large 03/24/2008 0812   BILIRUBINUR negative 03/24/2008 0812   UROBILINOGEN 0.2 03/24/2008 0812   NITRITE negative 03/24/2008 0812    RADIOGRAPHIC STUDIES: No results found.  ASSESSMENT:  #1. Bronchoalveolar carcinoma left upper lobe, status post left upper lobectomy in 2002, for repeat CAT scan. #2. History of supraventricular tachydysrhythmia, stable. #3. Hypertension, controlled.   PLAN:  #1. CT chest with contrast. #2. Followup one year with CBC, chem profile, LDH. Patient was told to call should any new symptoms occur that are troublesome or persistent   All questions were answered. The patient knows to call the clinic with any problems, questions or concerns. We can certainly see the patient much sooner if necessary.   I spent 25 and he is in minutes counseling the patient face to face. The total time spent in the appointment was 30 minutes.    Doroteo Bradford, MD 07/03/2014 12:45 PM  DISCLAIMER:  This note was dictated with voice recognition software.  Similar sounding words can inadvertently be transcribed inaccurately and may not be corrected upon review.

## 2014-07-03 NOTE — Patient Instructions (Signed)
[facility] Discharge Instructions  RECOMMENDATIONS MADE BY THE CONSULTANT AND ANY TEST RESULTS WILL BE SENT TO YOUR REFERRING PHYSICIAN.  EXAM FINDINGS BY THE PHYSICIAN TODAY AND SIGNS OR SYMPTOMS TO REPORT TO CLINIC OR PRIMARY PHYSICIAN: Exam and findings as discussed by Dr. Barnet Glasgow.  INSTRUCTIONS/FOLLOW-UP: 1.  Please follow-up as discussed with your CT of your chest; labs and an office visit in 1 year.  Contact us sooner if questions or concerns.  Thank you for choosing [facility] to provide your oncology and hematology care.  To afford each patient quality time with our providers, please arrive at least 15 minutes before your scheduled appointment time.  With your help, our goal is to use those 15 minutes to complete the necessary work-up to ensure our physicians have the information they need to help with your evaluation and healthcare recommendations.    Effective January 1st, 2014, we ask that you re-schedule your appointment with our physicians should you arrive 10 or more minutes late for your appointment.  We strive to give you quality time with our providers, and arriving late affects you and other patients whose appointments are after yours.    Again, thank you for choosing [facility].  Our hope is that these requests will decrease the amount of time that you wait before being seen by our physicians.       _____________________________________________________________  Should you have questions after your visit to [facility], please contact our office at [phone] between the hours of 8:30 a.m. and 4:30 p.m.  Voicemails left after 4:30 p.m. will not be returned until the following business day.  For prescription refill requests, have your pharmacy contact our office with your prescription refill request.    _______________________________________________________________  We hope that we have given you very good  care.  You may receive a patient satisfaction survey in the mail, please complete it and return it as soon as possible.  We value your feedback!  _______________________________________________________________  Have you asked about our STAR program?  STAR stands for Survivorship Training and Rehabilitation, and this is a nationally recognized cancer care program that focuses on survivorship and rehabilitation.  Cancer and cancer treatments may cause problems, such as, pain, making you feel tired and keeping you from doing the things that you need or want to do. Cancer rehabilitation can help. Our goal is to reduce these troubling effects and help you have the best quality of life possible.  You may receive a survey from a nurse that asks questions about your current state of health.  Based on the survey results, all eligible patients will be referred to the [facility] program for an evaluation so we can better serve you!  A frequently asked questions sheet is available upon request.

## 2014-07-09 ENCOUNTER — Ambulatory Visit (HOSPITAL_COMMUNITY)
Admission: RE | Admit: 2014-07-09 | Discharge: 2014-07-09 | Disposition: A | Discharge status: Home / Self Care | Payer: Medicare Other | Source: Ambulatory Visit | Attending: Hematology and Oncology | Admitting: Hematology and Oncology

## 2014-07-09 ENCOUNTER — Telehealth (HOSPITAL_COMMUNITY): Payer: Self-pay

## 2014-07-09 DIAGNOSIS — Z85118 Personal history of other malignant neoplasm of bronchus and lung: Secondary | ICD-10-CM | POA: Diagnosis not present

## 2014-07-09 DIAGNOSIS — R0989 Other specified symptoms and signs involving the circulatory and respiratory systems: Secondary | ICD-10-CM | POA: Diagnosis not present

## 2014-07-09 LAB — POCT I-STAT CREATININE: CREATININE: 1 mg/dL (ref 0.50–1.10)

## 2014-07-09 MED ORDER — IOHEXOL 300 MG/ML  SOLN
80.0000 mL | Freq: Once | INTRAMUSCULAR | Status: AC | PRN
  Administered 2014-07-09: 80 mL via INTRAVENOUS

## 2014-07-09 NOTE — Telephone Encounter (Signed)
Patient notified

## 2014-07-09 NOTE — Telephone Encounter (Signed)
Message copied by Mellissa Kohut on Thu Jul 09, 2014 12:46 PM ------      Message from: [place], [place]: Thu Jul 09, 2014 12:27 PM       Please call Ms.Spickler and tell her that her chest CT scan showed no evidence of cancer. Thanks ------

## 2014-08-25 DIAGNOSIS — Z23 Encounter for immunization: Secondary | ICD-10-CM | POA: Diagnosis not present

## 2014-09-21 DIAGNOSIS — I10 Essential (primary) hypertension: Secondary | ICD-10-CM | POA: Diagnosis not present

## 2014-09-21 DIAGNOSIS — C3490 Malignant neoplasm of unspecified part of unspecified bronchus or lung: Secondary | ICD-10-CM | POA: Diagnosis not present

## 2014-09-21 DIAGNOSIS — M25562 Pain in left knee: Secondary | ICD-10-CM | POA: Diagnosis not present

## 2014-09-21 DIAGNOSIS — R7301 Impaired fasting glucose: Secondary | ICD-10-CM | POA: Diagnosis not present

## 2014-09-21 DIAGNOSIS — R600 Localized edema: Secondary | ICD-10-CM | POA: Diagnosis not present

## 2014-09-21 DIAGNOSIS — E782 Mixed hyperlipidemia: Secondary | ICD-10-CM | POA: Diagnosis not present

## 2014-09-21 DIAGNOSIS — E785 Hyperlipidemia, unspecified: Secondary | ICD-10-CM | POA: Diagnosis not present

## 2014-10-01 ENCOUNTER — Other Ambulatory Visit (HOSPITAL_COMMUNITY): Payer: Self-pay | Admitting: Internal Medicine

## 2014-10-01 DIAGNOSIS — M8589 Other specified disorders of bone density and structure, multiple sites: Secondary | ICD-10-CM

## 2014-10-01 DIAGNOSIS — M8580 Other specified disorders of bone density and structure, unspecified site: Secondary | ICD-10-CM

## 2014-10-06 ENCOUNTER — Ambulatory Visit (HOSPITAL_COMMUNITY)
Admission: RE | Admit: 2014-10-06 | Discharge: 2014-10-06 | Disposition: A | Discharge status: Home / Self Care | Payer: Medicare Other | Source: Ambulatory Visit | Attending: Internal Medicine | Admitting: Internal Medicine

## 2014-10-06 DIAGNOSIS — Z9079 Acquired absence of other genital organ(s): Secondary | ICD-10-CM | POA: Insufficient documentation

## 2014-10-06 DIAGNOSIS — M899 Disorder of bone, unspecified: Secondary | ICD-10-CM | POA: Insufficient documentation

## 2014-10-06 DIAGNOSIS — Z780 Asymptomatic menopausal state: Secondary | ICD-10-CM | POA: Diagnosis not present

## 2014-10-06 DIAGNOSIS — R29890 Loss of height: Secondary | ICD-10-CM | POA: Insufficient documentation

## 2014-10-06 DIAGNOSIS — M8580 Other specified disorders of bone density and structure, unspecified site: Secondary | ICD-10-CM

## 2014-10-06 DIAGNOSIS — Z13820 Encounter for screening for osteoporosis: Secondary | ICD-10-CM | POA: Diagnosis not present

## 2014-10-06 DIAGNOSIS — Z8781 Personal history of (healed) traumatic fracture: Secondary | ICD-10-CM | POA: Insufficient documentation

## 2015-01-13 DIAGNOSIS — H402231 Chronic angle-closure glaucoma, bilateral, mild stage: Secondary | ICD-10-CM | POA: Diagnosis not present

## 2015-01-13 DIAGNOSIS — H04123 Dry eye syndrome of bilateral lacrimal glands: Secondary | ICD-10-CM | POA: Diagnosis not present

## 2015-02-15 ENCOUNTER — Other Ambulatory Visit (HOSPITAL_COMMUNITY): Payer: Self-pay | Admitting: Internal Medicine

## 2015-02-15 DIAGNOSIS — Z1231 Encounter for screening mammogram for malignant neoplasm of breast: Secondary | ICD-10-CM

## 2015-03-17 ENCOUNTER — Ambulatory Visit (HOSPITAL_COMMUNITY)
Admission: RE | Admit: 2015-03-17 | Discharge: 2015-03-17 | Disposition: A | Discharge status: Home / Self Care | Payer: Medicare Other | Source: Ambulatory Visit | Attending: Internal Medicine | Admitting: Internal Medicine

## 2015-03-17 ENCOUNTER — Inpatient Hospital Stay (HOSPITAL_COMMUNITY): Admission: RE | Admit: 2015-03-17 | Payer: Medicare Other | Source: Ambulatory Visit

## 2015-03-17 DIAGNOSIS — Z1231 Encounter for screening mammogram for malignant neoplasm of breast: Secondary | ICD-10-CM | POA: Insufficient documentation

## 2015-03-19 ENCOUNTER — Other Ambulatory Visit: Payer: Self-pay | Admitting: Internal Medicine

## 2015-03-19 DIAGNOSIS — R928 Other abnormal and inconclusive findings on diagnostic imaging of breast: Secondary | ICD-10-CM

## 2015-03-24 ENCOUNTER — Ambulatory Visit
Admission: RE | Admit: 2015-03-24 | Discharge: 2015-03-24 | Disposition: A | Discharge status: Home / Self Care | Payer: Medicare Other | Source: Ambulatory Visit | Attending: Internal Medicine | Admitting: Internal Medicine

## 2015-03-24 DIAGNOSIS — R928 Other abnormal and inconclusive findings on diagnostic imaging of breast: Secondary | ICD-10-CM | POA: Diagnosis not present

## 2015-04-10 DIAGNOSIS — L80 Vitiligo: Secondary | ICD-10-CM | POA: Diagnosis not present

## 2015-06-28 ENCOUNTER — Other Ambulatory Visit (HOSPITAL_COMMUNITY): Payer: Self-pay

## 2015-06-28 DIAGNOSIS — C3410 Malignant neoplasm of upper lobe, unspecified bronchus or lung: Secondary | ICD-10-CM

## 2015-07-02 ENCOUNTER — Ambulatory Visit (HOSPITAL_COMMUNITY): Payer: Medicare Other | Admitting: Hematology & Oncology

## 2015-07-02 ENCOUNTER — Other Ambulatory Visit (HOSPITAL_COMMUNITY): Payer: Medicare Other

## 2015-07-02 NOTE — Progress Notes (Signed)
This encounter was created in error - please disregard.

## 2015-07-16 ENCOUNTER — Encounter (HOSPITAL_COMMUNITY): Payer: Self-pay

## 2015-07-22 DIAGNOSIS — I10 Essential (primary) hypertension: Secondary | ICD-10-CM | POA: Diagnosis not present

## 2015-07-22 DIAGNOSIS — E782 Mixed hyperlipidemia: Secondary | ICD-10-CM | POA: Diagnosis not present

## 2015-07-24 DIAGNOSIS — E785 Hyperlipidemia, unspecified: Secondary | ICD-10-CM | POA: Diagnosis not present

## 2015-07-24 DIAGNOSIS — I10 Essential (primary) hypertension: Secondary | ICD-10-CM | POA: Diagnosis not present

## 2015-07-24 DIAGNOSIS — E876 Hypokalemia: Secondary | ICD-10-CM | POA: Diagnosis not present

## 2015-09-02 DIAGNOSIS — Z23 Encounter for immunization: Secondary | ICD-10-CM | POA: Diagnosis not present

## 2015-09-14 DIAGNOSIS — H402231 Chronic angle-closure glaucoma, bilateral, mild stage: Secondary | ICD-10-CM | POA: Diagnosis not present

## 2015-09-14 DIAGNOSIS — H02833 Dermatochalasis of right eye, unspecified eyelid: Secondary | ICD-10-CM | POA: Diagnosis not present

## 2015-09-14 DIAGNOSIS — H02403 Unspecified ptosis of bilateral eyelids: Secondary | ICD-10-CM | POA: Diagnosis not present

## 2015-10-11 DIAGNOSIS — M25551 Pain in right hip: Secondary | ICD-10-CM | POA: Diagnosis not present

## 2015-10-28 DIAGNOSIS — E782 Mixed hyperlipidemia: Secondary | ICD-10-CM | POA: Diagnosis not present

## 2015-10-28 DIAGNOSIS — I10 Essential (primary) hypertension: Secondary | ICD-10-CM | POA: Diagnosis not present

## 2015-10-30 DIAGNOSIS — I10 Essential (primary) hypertension: Secondary | ICD-10-CM | POA: Diagnosis not present

## 2015-10-30 DIAGNOSIS — M25551 Pain in right hip: Secondary | ICD-10-CM | POA: Diagnosis not present

## 2015-10-30 DIAGNOSIS — E876 Hypokalemia: Secondary | ICD-10-CM | POA: Diagnosis not present

## 2015-10-30 DIAGNOSIS — E782 Mixed hyperlipidemia: Secondary | ICD-10-CM | POA: Diagnosis not present

## 2015-10-30 DIAGNOSIS — L80 Vitiligo: Secondary | ICD-10-CM | POA: Diagnosis not present

## 2015-12-23 DIAGNOSIS — H402221 Chronic angle-closure glaucoma, left eye, mild stage: Secondary | ICD-10-CM | POA: Diagnosis not present

## 2015-12-23 DIAGNOSIS — H04123 Dry eye syndrome of bilateral lacrimal glands: Secondary | ICD-10-CM | POA: Diagnosis not present

## 2015-12-23 DIAGNOSIS — H2513 Age-related nuclear cataract, bilateral: Secondary | ICD-10-CM | POA: Diagnosis not present

## 2015-12-23 DIAGNOSIS — H402211 Chronic angle-closure glaucoma, right eye, mild stage: Secondary | ICD-10-CM | POA: Diagnosis not present

## 2015-12-23 DIAGNOSIS — H25013 Cortical age-related cataract, bilateral: Secondary | ICD-10-CM | POA: Diagnosis not present

## 2016-02-16 ENCOUNTER — Other Ambulatory Visit (HOSPITAL_COMMUNITY): Payer: Self-pay | Admitting: Internal Medicine

## 2016-02-16 ENCOUNTER — Other Ambulatory Visit: Payer: Self-pay

## 2016-02-16 DIAGNOSIS — Z1231 Encounter for screening mammogram for malignant neoplasm of breast: Secondary | ICD-10-CM

## 2016-03-17 ENCOUNTER — Ambulatory Visit (HOSPITAL_COMMUNITY)
Admission: RE | Admit: 2016-03-17 | Discharge: 2016-03-17 | Disposition: A | Discharge status: Home / Self Care | Payer: Medicare Other | Source: Ambulatory Visit | Attending: Internal Medicine | Admitting: Internal Medicine

## 2016-03-17 DIAGNOSIS — Z1231 Encounter for screening mammogram for malignant neoplasm of breast: Secondary | ICD-10-CM | POA: Diagnosis not present

## 2016-03-22 DIAGNOSIS — H40051 Ocular hypertension, right eye: Secondary | ICD-10-CM | POA: Diagnosis not present

## 2016-03-22 DIAGNOSIS — H402211 Chronic angle-closure glaucoma, right eye, mild stage: Secondary | ICD-10-CM | POA: Diagnosis not present

## 2016-03-22 DIAGNOSIS — H402221 Chronic angle-closure glaucoma, left eye, mild stage: Secondary | ICD-10-CM | POA: Diagnosis not present

## 2016-04-27 DIAGNOSIS — E782 Mixed hyperlipidemia: Secondary | ICD-10-CM | POA: Diagnosis not present

## 2016-04-28 DIAGNOSIS — I499 Cardiac arrhythmia, unspecified: Secondary | ICD-10-CM | POA: Diagnosis not present

## 2016-04-28 DIAGNOSIS — E782 Mixed hyperlipidemia: Secondary | ICD-10-CM | POA: Diagnosis not present

## 2016-04-28 DIAGNOSIS — I10 Essential (primary) hypertension: Secondary | ICD-10-CM | POA: Diagnosis not present

## 2016-06-24 DIAGNOSIS — M1000 Idiopathic gout, unspecified site: Secondary | ICD-10-CM | POA: Diagnosis not present

## 2016-07-26 ENCOUNTER — Other Ambulatory Visit: Payer: Self-pay

## 2016-08-04 DIAGNOSIS — L80 Vitiligo: Secondary | ICD-10-CM | POA: Diagnosis not present

## 2016-08-04 DIAGNOSIS — L818 Other specified disorders of pigmentation: Secondary | ICD-10-CM | POA: Diagnosis not present

## 2016-08-14 DIAGNOSIS — L309 Dermatitis, unspecified: Secondary | ICD-10-CM | POA: Diagnosis not present

## 2016-08-17 DIAGNOSIS — H402211 Chronic angle-closure glaucoma, right eye, mild stage: Secondary | ICD-10-CM | POA: Diagnosis not present

## 2016-08-17 DIAGNOSIS — H40051 Ocular hypertension, right eye: Secondary | ICD-10-CM | POA: Diagnosis not present

## 2016-08-17 DIAGNOSIS — H402221 Chronic angle-closure glaucoma, left eye, mild stage: Secondary | ICD-10-CM | POA: Diagnosis not present

## 2016-08-17 DIAGNOSIS — H40052 Ocular hypertension, left eye: Secondary | ICD-10-CM | POA: Diagnosis not present

## 2016-08-25 DIAGNOSIS — H40051 Ocular hypertension, right eye: Secondary | ICD-10-CM | POA: Diagnosis not present

## 2016-08-25 DIAGNOSIS — H402231 Chronic angle-closure glaucoma, bilateral, mild stage: Secondary | ICD-10-CM | POA: Diagnosis not present

## 2016-08-25 DIAGNOSIS — H02403 Unspecified ptosis of bilateral eyelids: Secondary | ICD-10-CM | POA: Diagnosis not present

## 2016-09-18 DIAGNOSIS — Z23 Encounter for immunization: Secondary | ICD-10-CM | POA: Diagnosis not present

## 2016-10-30 DIAGNOSIS — H402211 Chronic angle-closure glaucoma, right eye, mild stage: Secondary | ICD-10-CM | POA: Diagnosis not present

## 2016-10-30 DIAGNOSIS — H402221 Chronic angle-closure glaucoma, left eye, mild stage: Secondary | ICD-10-CM | POA: Diagnosis not present

## 2016-10-30 DIAGNOSIS — H35033 Hypertensive retinopathy, bilateral: Secondary | ICD-10-CM | POA: Diagnosis not present

## 2016-10-30 DIAGNOSIS — H2513 Age-related nuclear cataract, bilateral: Secondary | ICD-10-CM | POA: Diagnosis not present

## 2016-10-30 DIAGNOSIS — H25013 Cortical age-related cataract, bilateral: Secondary | ICD-10-CM | POA: Diagnosis not present

## 2016-11-02 DIAGNOSIS — E782 Mixed hyperlipidemia: Secondary | ICD-10-CM | POA: Diagnosis not present

## 2016-11-02 DIAGNOSIS — I10 Essential (primary) hypertension: Secondary | ICD-10-CM | POA: Diagnosis not present

## 2016-11-14 DIAGNOSIS — E782 Mixed hyperlipidemia: Secondary | ICD-10-CM | POA: Diagnosis not present

## 2016-11-14 DIAGNOSIS — R7301 Impaired fasting glucose: Secondary | ICD-10-CM | POA: Diagnosis not present

## 2016-11-14 DIAGNOSIS — I10 Essential (primary) hypertension: Secondary | ICD-10-CM | POA: Diagnosis not present

## 2016-12-12 DIAGNOSIS — H2511 Age-related nuclear cataract, right eye: Secondary | ICD-10-CM | POA: Diagnosis not present

## 2016-12-12 DIAGNOSIS — H25011 Cortical age-related cataract, right eye: Secondary | ICD-10-CM | POA: Diagnosis not present

## 2016-12-29 DIAGNOSIS — Z23 Encounter for immunization: Secondary | ICD-10-CM | POA: Diagnosis not present

## 2017-01-01 DIAGNOSIS — H2512 Age-related nuclear cataract, left eye: Secondary | ICD-10-CM | POA: Diagnosis not present

## 2017-01-01 DIAGNOSIS — H402231 Chronic angle-closure glaucoma, bilateral, mild stage: Secondary | ICD-10-CM | POA: Diagnosis not present

## 2017-01-01 DIAGNOSIS — H35033 Hypertensive retinopathy, bilateral: Secondary | ICD-10-CM | POA: Diagnosis not present

## 2017-01-01 DIAGNOSIS — H25012 Cortical age-related cataract, left eye: Secondary | ICD-10-CM | POA: Diagnosis not present

## 2017-01-23 DIAGNOSIS — H2512 Age-related nuclear cataract, left eye: Secondary | ICD-10-CM | POA: Diagnosis not present

## 2017-01-23 DIAGNOSIS — H25812 Combined forms of age-related cataract, left eye: Secondary | ICD-10-CM | POA: Diagnosis not present

## 2017-02-26 ENCOUNTER — Other Ambulatory Visit (HOSPITAL_COMMUNITY): Payer: Self-pay | Admitting: Internal Medicine

## 2017-02-26 DIAGNOSIS — Z1231 Encounter for screening mammogram for malignant neoplasm of breast: Secondary | ICD-10-CM

## 2017-03-15 ENCOUNTER — Ambulatory Visit (HOSPITAL_COMMUNITY): Payer: Medicare Other

## 2017-03-19 ENCOUNTER — Ambulatory Visit (HOSPITAL_COMMUNITY)
Admission: RE | Admit: 2017-03-19 | Discharge: 2017-03-19 | Disposition: A | Discharge status: Home / Self Care | Payer: Medicare Other | Source: Ambulatory Visit | Attending: Internal Medicine | Admitting: Internal Medicine

## 2017-03-19 DIAGNOSIS — Z1231 Encounter for screening mammogram for malignant neoplasm of breast: Secondary | ICD-10-CM | POA: Insufficient documentation

## 2017-03-22 ENCOUNTER — Encounter (HOSPITAL_COMMUNITY): Payer: Self-pay | Admitting: Emergency Medicine

## 2017-03-22 ENCOUNTER — Emergency Department (HOSPITAL_COMMUNITY): Payer: Medicare Other

## 2017-03-22 ENCOUNTER — Emergency Department (HOSPITAL_COMMUNITY)
Admission: EM | Admit: 2017-03-22 | Discharge: 2017-03-22 | Disposition: A | Discharge status: Home / Self Care | Payer: Medicare Other | Attending: Emergency Medicine | Admitting: Emergency Medicine

## 2017-03-22 DIAGNOSIS — I10 Essential (primary) hypertension: Secondary | ICD-10-CM | POA: Insufficient documentation

## 2017-03-22 DIAGNOSIS — R1031 Right lower quadrant pain: Secondary | ICD-10-CM | POA: Diagnosis present

## 2017-03-22 DIAGNOSIS — N3001 Acute cystitis with hematuria: Secondary | ICD-10-CM | POA: Diagnosis not present

## 2017-03-22 DIAGNOSIS — J45909 Unspecified asthma, uncomplicated: Secondary | ICD-10-CM | POA: Diagnosis not present

## 2017-03-22 DIAGNOSIS — Z79899 Other long term (current) drug therapy: Secondary | ICD-10-CM | POA: Insufficient documentation

## 2017-03-22 DIAGNOSIS — N132 Hydronephrosis with renal and ureteral calculous obstruction: Secondary | ICD-10-CM | POA: Diagnosis not present

## 2017-03-22 DIAGNOSIS — N200 Calculus of kidney: Secondary | ICD-10-CM | POA: Insufficient documentation

## 2017-03-22 HISTORY — DX: Malignant (primary) neoplasm, unspecified: C80.1

## 2017-03-22 LAB — URINALYSIS, ROUTINE W REFLEX MICROSCOPIC
BILIRUBIN URINE: NEGATIVE
GLUCOSE, UA: NEGATIVE mg/dL
Ketones, ur: NEGATIVE mg/dL
NITRITE: POSITIVE — AB
PH: 8 (ref 5.0–8.0)
Protein, ur: 30 mg/dL — AB
SPECIFIC GRAVITY, URINE: 1.015 (ref 1.005–1.030)

## 2017-03-22 LAB — CBC
HCT: 41.8 % (ref 36.0–46.0)
HEMOGLOBIN: 13.8 g/dL (ref 12.0–15.0)
MCH: 27.1 pg (ref 26.0–34.0)
MCHC: 33 g/dL (ref 30.0–36.0)
MCV: 82.1 fL (ref 78.0–100.0)
Platelets: 211 10*3/uL (ref 150–400)
RBC: 5.09 MIL/uL (ref 3.87–5.11)
RDW: 14.6 % (ref 11.5–15.5)
WBC: 15.2 10*3/uL — AB (ref 4.0–10.5)

## 2017-03-22 LAB — COMPREHENSIVE METABOLIC PANEL
ALK PHOS: 71 U/L (ref 38–126)
ALT: 26 U/L (ref 14–54)
AST: 25 U/L (ref 15–41)
Albumin: 4.1 g/dL (ref 3.5–5.0)
Anion gap: 10 (ref 5–15)
BUN: 11 mg/dL (ref 6–20)
CO2: 31 mmol/L (ref 22–32)
Calcium: 8.8 mg/dL — ABNORMAL LOW (ref 8.9–10.3)
Chloride: 99 mmol/L — ABNORMAL LOW (ref 101–111)
Creatinine, Ser: 0.86 mg/dL (ref 0.44–1.00)
GFR calc Af Amer: >60 mL/min (ref >60)
Glucose, Bld: 167 mg/dL — ABNORMAL HIGH (ref 65–99)
Potassium: 2.9 mmol/L — ABNORMAL LOW (ref 3.5–5.1)
Sodium: 140 mmol/L (ref 135–145)
TOTAL PROTEIN: 7.3 g/dL (ref 6.5–8.1)
Total Bilirubin: 1 mg/dL (ref 0.3–1.2)

## 2017-03-22 LAB — I-STAT TROPONIN, ED
Troponin i, poc: 0 ng/mL (ref 0.00–0.08)
Troponin i, poc: 0 ng/mL (ref 0.00–0.08)

## 2017-03-22 LAB — I-STAT CG4 LACTIC ACID, ED
Lactic Acid, Venous: 1.98 mmol/L (ref 0.5–1.9)
Lactic Acid, Venous: 1.22 mmol/L (ref 0.5–1.9)

## 2017-03-22 LAB — LIPASE, BLOOD: Lipase: 16 U/L (ref 11–51)

## 2017-03-22 MED ORDER — CIPROFLOXACIN HCL 500 MG PO TABS: 500.0000 mg | ORAL_TABLET | Freq: Two times a day (BID) | ORAL | 0 refills | Status: DC

## 2017-03-22 MED ORDER — TAMSULOSIN HCL 0.4 MG PO CAPS: 0.4000 mg | ORAL_CAPSULE | Freq: Every day | ORAL | 0 refills | Status: DC

## 2017-03-22 MED ORDER — IOPAMIDOL (ISOVUE-300) INJECTION 61%
100.0000 mL | Freq: Once | INTRAVENOUS | Status: AC | PRN
  Administered 2017-03-22: 100 mL via INTRAVENOUS

## 2017-03-22 MED ORDER — CEFTRIAXONE SODIUM 1 G IJ SOLR
1.0000 g | Freq: Once | INTRAMUSCULAR | Status: AC
  Administered 2017-03-22: 1 g via INTRAVENOUS
  Filled 2017-03-22: qty 10

## 2017-03-22 MED ORDER — OXYCODONE-ACETAMINOPHEN 5-325 MG PO TABS: 2.0000 | ORAL_TABLET | ORAL | 0 refills | Status: DC | PRN

## 2017-03-22 MED ORDER — MORPHINE SULFATE (PF) 4 MG/ML IV SOLN
2.0000 mg | Freq: Once | INTRAVENOUS | Status: AC
  Administered 2017-03-22: 2 mg via INTRAVENOUS
  Filled 2017-03-22: qty 1

## 2017-03-22 MED ORDER — SODIUM CHLORIDE 0.9 % IV BOLUS (SEPSIS)
500.0000 mL | Freq: Once | INTRAVENOUS | Status: AC
  Administered 2017-03-22: 500 mL via INTRAVENOUS

## 2017-03-22 MED ORDER — ONDANSETRON HCL 4 MG/2ML IJ SOLN
4.0000 mg | Freq: Once | INTRAMUSCULAR | Status: AC
  Administered 2017-03-22: 4 mg via INTRAVENOUS
  Filled 2017-03-22: qty 2

## 2017-03-22 NOTE — ED Notes (Signed)
CRITICAL VALUE ALERT  Critical value received: lactic 1.98  Date of notification:  03/22/2017  Time of notification:  9937  Critical value read back:Yes.    Nurse who received alert:  Domenica Reamer RN   MD notified (1st page):  Dr Laverta [place]  Time of first page:  [phone]  MD notified (2nd page):  Time of second page:  Responding MD: Dr Laverta [place]  Time MD responded:  [phone]

## 2017-03-22 NOTE — ED Triage Notes (Signed)
Patient c/o pain in right lower abd that started this morning at 2am. Per patient had normal BM right before pain. Denies any blood in stool. Per patient nausea and vomiting, Denies any fevers or urinary symptoms.

## 2017-03-22 NOTE — Discharge Instructions (Signed)
You were seen in the ED today with a kidney stone. We found evidence of a urine infection so it is important to see a Marine scientist. They are expecting you at 8 AM tomorrow. Do not eat or drink anything after midnight.   Return to the ED immediately with any new or worsening pain, fever, chills, or unusual drowsiness.

## 2017-03-22 NOTE — ED Provider Notes (Signed)
Emergency Department Provider Note  By signing my name below, I, Hilbert Odor, attest that this documentation has been prepared under the direction and in the presence of Margette Fast, MD. Electronically Signed: Hilbert Odor, Scribe. 03/22/17. 10:55 AM.   I have reviewed the triage vital signs and the nursing notes.   HISTORY  Chief Complaint Abdominal Pain   HPI Comments: Miranda [name] is a 83 y.o. female who presents to the Emergency Department complaining of non-radiating RLQ abdominal pain that began around 2 am this morning. She states that the pain worsens while walking. She states that she felt like she was going to "faint" while she was walking earlier today. She has never experienced any pain like this in the past. She also reports having some nausea and vomiting shortly after her abdominal pain but non recently. She reports no sick contact recently. She denies fevers, blood in stool, or any urinary symptoms.  Past Medical History:  Diagnosis Date  . Asthma   . Cancer ([facility])   . Essential hypertension, benign   . Mixed hyperlipidemia   . Nephrolithiasis     Patient Active Problem List   Diagnosis Date Noted  . PSVT (paroxysmal supraventricular tachycardia) ([facility]) 01/05/2012  . Near syncope 12/05/2011  . Palpitations 12/05/2011  . Abnormal ECG 12/05/2011  . HYPERLIPIDEMIA 11/10/2006  . Essential hypertension, benign 11/10/2006    Past Surgical History:  Procedure Laterality Date  . ABDOMINAL HYSTERECTOMY     excessive bleeding, no cancer   . Left knee arthroscopy due to torn ligament and cartiallege  2006   [facility]   . Left lung - lobectomy lower lobe - lung cancer      Current Outpatient Rx  . Order #: [id number] Class: Historical Med  . Order #: [id number] Class: Historical Med  . Order #: [id number] Class: Historical Med  . Order #: [id number] Class: Historical Med  . Order #: [id number] Class: Historical Med  . Order #: [id number] Class: Historical Med    . Order #: [id number] Class: Historical Med  . Order #: [id number] Class: Historical Med  . Order #: [id number] Class: Historical Med  . Order #: [id number] Class: Historical Med  . Order #: [id number] Class: Print  . Order #: [id number] Class: Print  . Order #: [id number] Class: Print    Allergies Bee venom  Family History  Problem Relation Age of Onset  . Colon cancer Father   . Heart attack Mother   . Anuerysm Mother     Brain   . Stroke Sister   . Hypertension Sister   . Hypertension Sister   . Coronary artery disease Sister   . Hypertension Son   . Cancer      Family history   . Arthritis      Family history     Social History Social History  Substance Use Topics  . Smoking status: Never Smoker  . Smokeless tobacco: Never Used  . Alcohol use Yes     Comment: occasional    Review of Systems Constitutional: No fever/chills Eyes: No visual changes. ENT: No sore throat. Cardiovascular: Denies chest pain. Respiratory: Denies shortness of breath. Gastrointestinal: Positive for abdominal pain, nausea, and vomiting.  No diarrhea.  No constipation. Negative for blood in stool. Genitourinary: Negative for dysuria. Musculoskeletal: Negative for back pain. Skin: Negative for rash. Neurological: Positive for dizziness (resolved). Negative for headaches, focal weakness or numbness.  10-point ROS otherwise negative.  ____________________________________________   PHYSICAL EXAM:  VITAL SIGNS: ED Triage Vitals  Enc Vitals Group  BP 03/22/17 1023 140/76     Pulse Rate 03/22/17 1023 67     Resp 03/22/17 1023 18     Temp 03/22/17 1023 97.8 F (36.6 C)     Temp Source 03/22/17 1023 Oral     SpO2 03/22/17 1023 100 %     Weight 03/22/17 1019 172 lb (78 kg)     Height 03/22/17 1019 '5\' 1"'$  (1.549 m)     Pain Score 03/22/17 1019 10   Constitutional: Alert and oriented. Appears uncomfortable.  Eyes: Conjunctivae are normal.  Head: Atraumatic. Nose: No  congestion/rhinnorhea. Mouth/Throat: Mucous membranes are dry.  Neck: No stridor.  Cardiovascular: Normal rate, regular rhythm. Good peripheral circulation. Grossly normal heart sounds.   Respiratory: Normal respiratory effort.  No retractions. Lungs CTAB. Gastrointestinal: Soft with mild RLQ tenderness. No rebound or guarding. No distention.  Musculoskeletal: No lower extremity tenderness nor edema. No gross deformities of extremities. Neurologic:  Normal speech and language. No gross focal neurologic deficits are appreciated.  Skin:  Skin is warm, dry and intact. No rash noted.   ____________________________________________   LABS (all labs ordered are listed, but only abnormal results are displayed)  Labs Reviewed  COMPREHENSIVE METABOLIC PANEL - Abnormal; Notable for the following:       Result Value   Potassium 2.9 (*)    Chloride 99 (*)    Glucose, Bld 167 (*)    Calcium 8.8 (*)    All other components within normal limits  CBC - Abnormal; Notable for the following:    WBC 15.2 (*)    All other components within normal limits  URINALYSIS, ROUTINE W REFLEX MICROSCOPIC - Abnormal; Notable for the following:    APPearance HAZY (*)    Hgb urine dipstick MODERATE (*)    Protein, ur 30 (*)    Nitrite POSITIVE (*)    Leukocytes, UA LARGE (*)    Bacteria, UA RARE (*)    Squamous Epithelial / LPF 0-5 (*)    Crystals PRESENT (*)    All other components within normal limits  I-STAT CG4 LACTIC ACID, ED - Abnormal; Notable for the following:    Lactic Acid, Venous 1.98 (*)    All other components within normal limits  URINE CULTURE  LIPASE, BLOOD  I-STAT TROPOININ, ED  I-STAT CG4 LACTIC ACID, ED  I-STAT TROPOININ, ED   ____________________________________________  RADIOLOGY  Ct Abdomen Pelvis W Contrast  Result Date: 03/22/2017 CLINICAL DATA:  83 y.o. female who presents to the Emergency Department complaining of non-radiating RLQ abdominal pain that began around 2 am  this morning. She states that the pain worsens while walking. EXAM: CT ABDOMEN AND PELVIS WITH CONTRAST TECHNIQUE: Multidetector CT imaging of the abdomen and pelvis was performed using the standard protocol following bolus administration of intravenous contrast. CONTRAST:  185m ISOVUE-300 IOPAMIDOL (ISOVUE-300) INJECTION 61% COMPARISON:  None. FINDINGS: Lower chest: No acute abnormality. Hepatobiliary: No focal liver abnormality is seen. No gallstones, gallbladder wall thickening, or biliary dilatation. Pancreas: Unremarkable. No pancreatic ductal dilatation or surrounding inflammatory changes. Spleen: Normal in size without focal abnormality. Adrenals/Urinary Tract: Adrenal glands are unremarkable. No renal mass. 6 mm mid-distal right ureteral calculus resulting in moderate-severe right hydroureteronephrosis and mild perinephric stranding. No other renal, ureteral bladder calculus. Bladder is unremarkable. Stomach/Bowel: Stomach is within normal limits. Appendix appears normal. No evidence of bowel wall thickening, distention, or inflammatory changes. Vascular/Lymphatic: Abdominal aortic atherosclerosis. Normal caliber abdominal aorta. No lymphadenopathy. Reproductive: Status post hysterectomy. No adnexal masses. Other:  Small fat containing umbilical hernia. Musculoskeletal: No acute osseous abnormality. No lytic or sclerotic osseous lesion. Severe degenerative disc disease with disc height loss at L4-5 and L5-S1 with bilateral facet arthropathy and foraminal stenosis. Grade 1 anterolisthesis of L3 on L4 secondary to severe bilateral facet disease. Bilateral facet arthropathy involving the remainder of the lower thoracic and upper lumbar spine. IMPRESSION: 1. 6 mm mid-distal right ureteral calculus resulting in moderate-severe right hydroureteronephrosis and mild perinephric stranding. 2.  Aortic Atherosclerosis (ICD10-170.0) Electronically Signed   By: Kathreen Devoid   On: 03/22/2017 13:19     ____________________________________________   PROCEDURES  Procedure(s) performed:   Procedures  None ____________________________________________   INITIAL IMPRESSION / ASSESSMENT AND PLAN / ED COURSE  Pertinent labs & imaging results that were available during my care of the patient were reviewed by me and considered in my medical decision making (see chart for details).  Patient presents to the ED with sudden onset RLQ pain. Mild tenderness to palpation. Appears uncomfortable. No surgical history on abdomen. No urinary symptoms. Low suspicion for vascular etiology with right sided pain. Plan for labs, pain control, and CT abdomen/pelvis.   01:30 PM Spoke with Dr. Karsten Ro with Urology regarding the CT scan and UA. Patient is afebrile here with borderline lactate elevation. She is looking better after IV fluids and pain medication. Plan for Rocephin. If feeling well enough for discharge Dr. Karsten Ro can see the patient in office tomorrow at 8 AM. Advises that she remain NPO after midnight for possible procedure tomorrow. Plan for IVF, PO challenge, and repeat lactate to ensure it is not increasing. Patient is ambulatory to the bathroom without difficulty.   Lactate down-trending. Pain well controlled. Discharging home with Cipro, Flomax, and pain medication. Will see Urology at 8 AM tomorrow for follow up. Discussed return precautions in great detail with patient and husband.   At this time, I do not feel there is any life-threatening condition present. I have reviewed and discussed all results (EKG, imaging, lab, urine as appropriate), exam findings with patient. I have reviewed nursing notes and appropriate previous records.  I feel the patient is safe to be discharged home without further emergent workup. Discussed usual and customary return precautions. Patient and family (if present) verbalize understanding and are comfortable with this plan.  Patient will follow-up with their  primary care provider. If they do not have a primary care provider, information for follow-up has been provided to them. All questions have been answered.  ____________________________________________  FINAL CLINICAL IMPRESSION(S) / ED DIAGNOSES  Final diagnoses:  Right kidney stone  Acute cystitis with hematuria     MEDICATIONS GIVEN DURING THIS VISIT:  Medications  ondansetron (ZOFRAN) injection 4 mg (4 mg Intravenous Given 03/22/17 1040)  sodium chloride 0.9 % bolus 500 mL (0 mLs Intravenous Stopped 03/22/17 1200)  morphine 4 MG/ML injection 2 mg (2 mg Intravenous Given 03/22/17 1122)  iopamidol (ISOVUE-300) 61 % injection 100 mL (100 mLs Intravenous Contrast Given 03/22/17 1256)  cefTRIAXone (ROCEPHIN) 1 g in dextrose 5 % 50 mL IVPB (0 g Intravenous Stopped 03/22/17 1455)  sodium chloride 0.9 % bolus 500 mL (0 mLs Intravenous Stopped 03/22/17 1530)     NEW OUTPATIENT MEDICATIONS STARTED DURING THIS VISIT:  Discharge Medication List as of 03/22/2017  3:15 PM    START taking these medications   Details  ciprofloxacin (CIPRO) 500 MG tablet Take 1 tablet (500 mg total) by mouth every 12 (twelve) hours., Starting Thu 03/22/2017, Print  oxyCODONE-acetaminophen (PERCOCET/ROXICET) 5-325 MG tablet Take 2 tablets by mouth every 4 (four) hours as needed for severe pain., Starting Thu 03/22/2017, Print    tamsulosin (FLOMAX) 0.4 MG CAPS capsule Take 1 capsule (0.4 mg total) by mouth daily., Starting Thu 03/22/2017, Print         Note:  This document was prepared using Dragon voice recognition software and may include unintentional dictation errors.  Nanda Quinton, MD Emergency Medicine  I personally performed the services described in this documentation, which was scribed in my presence. The recorded information has been reviewed and is accurate.      Margette Fast, MD 03/22/17 [phone]

## 2017-03-23 DIAGNOSIS — N130 Hydronephrosis with ureteropelvic junction obstruction: Secondary | ICD-10-CM | POA: Diagnosis not present

## 2017-03-23 DIAGNOSIS — N201 Calculus of ureter: Secondary | ICD-10-CM | POA: Diagnosis not present

## 2017-03-23 DIAGNOSIS — N3000 Acute cystitis without hematuria: Secondary | ICD-10-CM | POA: Diagnosis not present

## 2017-03-25 LAB — URINE CULTURE: Special Requests: NORMAL

## 2017-03-26 ENCOUNTER — Telehealth: Payer: Self-pay | Admitting: Emergency Medicine

## 2017-03-26 NOTE — Telephone Encounter (Signed)
Post ED Visit - Positive Culture Follow-up  Culture report reviewed by antimicrobial stewardship pharmacist:  '[]'$  Elenor Quinones, Pharm.D. '[x]'$  Heide Guile, Pharm.D., BCPS AQ-ID '[]'$  Parks Neptune, Pharm.D., BCPS '[]'$  Alycia Rossetti, Pharm.D., BCPS '[]'$  [place], Pharm.D., BCPS, AAHIVP '[]'$  Legrand [place], Pharm.D., BCPS, AAHIVP '[]'$  Salome Arnt, PharmD, BCPS '[]'$  Dimitri Ped, PharmD, BCPS '[]'$  Vincenza Hews, PharmD, BCPS  Positive urine culture Treated with ciprofloxacin,organism sensitive to the same and no further patient follow-up is required at this time.  Hazle Nordmann 03/26/2017, 12:09 PM

## 2017-03-26 NOTE — Telephone Encounter (Signed)
Post ED Visit - Positive Culture Follow-up  Culture report reviewed by antimicrobial stewardship pharmacist:  '[]'$  Elenor Quinones, Pharm.D. '[x]'$  Heide Guile, Pharm.D., BCPS AQ-ID '[]'$  [place], Pharm.D., BCPS '[]'$  Alycia Rossetti, Pharm.D., BCPS '[]'$  [place], Pharm.D., BCPS, AAHIVP '[]'$  Legrand [place], Pharm.D., BCPS, AAHIVP '[]'$  Salome Arnt, PharmD, BCPS '[]'$  Dimitri Ped, PharmD, BCPS '[]'$  Vincenza Hews, PharmD, BCPS  Positive urine culture Treated with ciprofloxacin, organism sensitive to the same and no further patient follow-up is required at this time.  Hazle Nordmann 03/26/2017, 11:59 AM

## 2017-04-12 DIAGNOSIS — N3000 Acute cystitis without hematuria: Secondary | ICD-10-CM | POA: Diagnosis not present

## 2017-04-12 DIAGNOSIS — N201 Calculus of ureter: Secondary | ICD-10-CM | POA: Diagnosis not present

## 2017-04-26 DIAGNOSIS — H04123 Dry eye syndrome of bilateral lacrimal glands: Secondary | ICD-10-CM | POA: Diagnosis not present

## 2017-04-26 DIAGNOSIS — H2013 Chronic iridocyclitis, bilateral: Secondary | ICD-10-CM | POA: Diagnosis not present

## 2017-04-26 DIAGNOSIS — H402231 Chronic angle-closure glaucoma, bilateral, mild stage: Secondary | ICD-10-CM | POA: Diagnosis not present

## 2017-04-26 DIAGNOSIS — H18232 Secondary corneal edema, left eye: Secondary | ICD-10-CM | POA: Diagnosis not present

## 2017-05-11 DIAGNOSIS — E782 Mixed hyperlipidemia: Secondary | ICD-10-CM | POA: Diagnosis not present

## 2017-05-11 DIAGNOSIS — I10 Essential (primary) hypertension: Secondary | ICD-10-CM | POA: Diagnosis not present

## 2017-05-15 DIAGNOSIS — E782 Mixed hyperlipidemia: Secondary | ICD-10-CM | POA: Diagnosis not present

## 2017-05-15 DIAGNOSIS — R7301 Impaired fasting glucose: Secondary | ICD-10-CM | POA: Diagnosis not present

## 2017-05-15 DIAGNOSIS — I10 Essential (primary) hypertension: Secondary | ICD-10-CM | POA: Diagnosis not present

## 2017-05-29 DIAGNOSIS — R609 Edema, unspecified: Secondary | ICD-10-CM | POA: Diagnosis not present

## 2017-06-12 DIAGNOSIS — I10 Essential (primary) hypertension: Secondary | ICD-10-CM | POA: Diagnosis not present

## 2017-06-12 DIAGNOSIS — R609 Edema, unspecified: Secondary | ICD-10-CM | POA: Diagnosis not present

## 2017-07-02 DIAGNOSIS — H2013 Chronic iridocyclitis, bilateral: Secondary | ICD-10-CM | POA: Diagnosis not present

## 2017-07-02 DIAGNOSIS — H18232 Secondary corneal edema, left eye: Secondary | ICD-10-CM | POA: Diagnosis not present

## 2017-07-02 DIAGNOSIS — H402231 Chronic angle-closure glaucoma, bilateral, mild stage: Secondary | ICD-10-CM | POA: Diagnosis not present

## 2017-07-02 DIAGNOSIS — H40053 Ocular hypertension, bilateral: Secondary | ICD-10-CM | POA: Diagnosis not present

## 2017-08-30 DIAGNOSIS — H04123 Dry eye syndrome of bilateral lacrimal glands: Secondary | ICD-10-CM | POA: Diagnosis not present

## 2017-08-30 DIAGNOSIS — H40053 Ocular hypertension, bilateral: Secondary | ICD-10-CM | POA: Diagnosis not present

## 2017-08-30 DIAGNOSIS — H402231 Chronic angle-closure glaucoma, bilateral, mild stage: Secondary | ICD-10-CM | POA: Diagnosis not present

## 2017-08-30 DIAGNOSIS — H2013 Chronic iridocyclitis, bilateral: Secondary | ICD-10-CM | POA: Diagnosis not present

## 2017-10-16 DIAGNOSIS — M79604 Pain in right leg: Secondary | ICD-10-CM | POA: Diagnosis not present

## 2017-11-14 DIAGNOSIS — R7301 Impaired fasting glucose: Secondary | ICD-10-CM | POA: Diagnosis not present

## 2017-11-14 DIAGNOSIS — I10 Essential (primary) hypertension: Secondary | ICD-10-CM | POA: Diagnosis not present

## 2017-11-14 DIAGNOSIS — E876 Hypokalemia: Secondary | ICD-10-CM | POA: Diagnosis not present

## 2017-11-14 DIAGNOSIS — E782 Mixed hyperlipidemia: Secondary | ICD-10-CM | POA: Diagnosis not present

## 2017-11-16 DIAGNOSIS — R7301 Impaired fasting glucose: Secondary | ICD-10-CM | POA: Diagnosis not present

## 2017-11-16 DIAGNOSIS — E782 Mixed hyperlipidemia: Secondary | ICD-10-CM | POA: Diagnosis not present

## 2017-11-16 DIAGNOSIS — R944 Abnormal results of kidney function studies: Secondary | ICD-10-CM | POA: Diagnosis not present

## 2017-11-16 DIAGNOSIS — I10 Essential (primary) hypertension: Secondary | ICD-10-CM | POA: Diagnosis not present

## 2017-11-16 DIAGNOSIS — Z23 Encounter for immunization: Secondary | ICD-10-CM | POA: Diagnosis not present

## 2017-11-16 DIAGNOSIS — Z0001 Encounter for general adult medical examination with abnormal findings: Secondary | ICD-10-CM | POA: Diagnosis not present

## 2017-11-16 DIAGNOSIS — M79604 Pain in right leg: Secondary | ICD-10-CM | POA: Diagnosis not present

## 2017-12-10 ENCOUNTER — Ambulatory Visit (INDEPENDENT_AMBULATORY_CARE_PROVIDER_SITE_OTHER): Payer: Medicare Other | Admitting: Orthopedic Surgery

## 2017-12-10 ENCOUNTER — Encounter: Payer: Self-pay | Admitting: Orthopedic Surgery

## 2017-12-10 ENCOUNTER — Ambulatory Visit (INDEPENDENT_AMBULATORY_CARE_PROVIDER_SITE_OTHER): Payer: Medicare Other

## 2017-12-10 VITALS — BP 121/72 | HR 52 | Ht 60.0 in | Wt 165.0 lb

## 2017-12-10 DIAGNOSIS — M4316 Spondylolisthesis, lumbar region: Secondary | ICD-10-CM | POA: Diagnosis not present

## 2017-12-10 DIAGNOSIS — M79604 Pain in right leg: Secondary | ICD-10-CM | POA: Diagnosis not present

## 2017-12-10 DIAGNOSIS — M47816 Spondylosis without myelopathy or radiculopathy, lumbar region: Secondary | ICD-10-CM | POA: Diagnosis not present

## 2017-12-10 DIAGNOSIS — M1711 Unilateral primary osteoarthritis, right knee: Secondary | ICD-10-CM

## 2017-12-10 MED ORDER — METHOCARBAMOL 500 MG PO TABS: 500.0000 mg | ORAL_TABLET | Freq: Every day | ORAL | 1 refills | Status: DC

## 2017-12-10 MED ORDER — IBUPROFEN 800 MG PO TABS: 800.0000 mg | ORAL_TABLET | Freq: Three times a day (TID) | ORAL | 1 refills | Status: DC | PRN

## 2017-12-10 NOTE — Progress Notes (Signed)
NEW PATIENT OFFICE VISIT    Chief Complaint  Patient presents with  . Leg Pain    Right leg pain, referred by Dr. Wende Neighbors.    Miranda [name] is 84 years old she presents for evaluation right leg and hip  She complains of pain in the right hip radiating to the right knee which is increased at night associated with giving way of the right knee and swelling of the right knee.  She has been treated with 1 injection IM, prednisone Dosepak and a muscle relaxer.  She continues to complain of dull moderate aching right hip pain localized to the right buttock radiates to the right knee which is been present now for several months not improving.    Review of Systems  Constitutional: Negative for chills, fever and weight loss.  Respiratory: Negative for shortness of breath.   Cardiovascular: Negative for chest pain.  Neurological: Negative for tingling.     Past Medical History:  Diagnosis Date  . Asthma   . Cancer ([place])   . Essential hypertension, benign   . Mixed hyperlipidemia   . Nephrolithiasis     Past Surgical History:  Procedure Laterality Date  . ABDOMINAL HYSTERECTOMY     excessive bleeding, no cancer   . Left knee arthroscopy due to torn ligament and cartiallege  2006   [facility]   . Left lung - lobectomy lower lobe - lung cancer      Family History  Problem Relation Age of Onset  . Colon cancer Father   . Heart attack Mother   . Anuerysm Mother        Brain   . Stroke Sister   . Hypertension Sister   . Hypertension Sister   . Coronary artery disease Sister   . Hypertension Son   . Cancer Unknown        Family history   . Arthritis Unknown        Family history    Social History   Tobacco Use  . Smoking status: Never Smoker  . Smokeless tobacco: Never Used  Substance Use Topics  . Alcohol use: Yes    Comment: occasional  . Drug use: No    Allergies  Allergen Reactions  . Bee Venom Swelling     No outpatient medications have been marked as taking  for the 12/10/17 encounter (Office Visit) with Carole Civil, MD.    BP 121/72   Pulse (!) 52   Ht 5' (1.524 m)   Wt 165 lb (74.8 kg)   BMI 32.22 kg/m   Physical Exam  Constitutional: She is oriented to person, place, and time. She appears well-developed and well-nourished.  Musculoskeletal:       Arms: Altered posture stance and gait  Neurological: She is alert and oriented to person, place, and time.  Psychiatric: She has a normal mood and affect. Judgment normal.  Vitals reviewed.   Ortho Exam  Normal range of motion both hips right and left with no pain on internal rotation and no palpable groin pain both hips are stable and hip flexion strength is normal in each leg both legs have normal skin  Pulses and temperature are normal in each right and left leg without edema and sensation is normal on the right and left leg with normal 1+ ankle 2+ knee reflexes no pathologic reflexes negative straight leg raises normal coordination and balance  Greater trochanters are nontender but no detectable weakness in the right or left lower extremity  Imaging of the hip via pelvic x-ray showed normal right and left hip joint  Lumbar spine please see the dictated report that x-ray I read as spondylosis, spondylolisthesis coronal plane and sagittal plane malalignment, degenerative disc disease   Meds ordered this encounter  Medications  . methocarbamol (ROBAXIN) 500 MG tablet    Sig: Take 1 tablet (500 mg total) by mouth at bedtime.    Dispense:  60 tablet    Refill:  1  . ibuprofen (ADVIL,MOTRIN) 800 MG tablet    Sig: Take 1 tablet (800 mg total) by mouth every 8 (eight) hours as needed.    Dispense:  90 tablet    Refill:  1    Encounter Diagnoses  Name Primary?  . Right leg pain   . Lumbar spondylosis   . Spondylolisthesis of lumbar region Yes  . Arthritis of right knee      PLAN:   Recommend a course of physical therapy to start.  We have made any improvement and if  further spinal imaging is needed

## 2017-12-10 NOTE — Patient Instructions (Addendum)
Recommend physical therapy  Recommend oral anti-inflammatory  Recommend heating pad as needed   Spondylolisthesis Spondylolisthesis is when one of the bones in the spine (vertebrae) slips forward and out of place. This most commonly occurs in the lower back (lumbar spine), but it can happen anywhere along the spine. A vertebra may move out of place due to a previous back injury. In some cases, the injury may go unnoticed until the vertebra moves out of place later in life. Spondylolisthesis may also be caused by an injury or a condition that affects the bones. What are the causes? This condition may be caused by:  Injury (trauma). This is often a result of doing sports or physical activities that: ? Put a lot of strain on the bones in the lower back. ? Involve repetitive overstretching (hyperextension) of the spine.  A condition that affects the bones, such as osteoarthritis or cancer.  Changes in the spine that happen due to age-related wear and tear.  What increases the risk? The following factors may make you more likely to develop this condition:  Participating in sports or activities that put a lot of strain on the lower back, including: ? Gymnastics. ? Figure skating. ? Weight lifting. ? Football.  Having a condition that affects the bones.  Being older than age 70.  What are the signs or symptoms? Symptoms of this condition may include:  Mild to severe pain in the legs, lower back, or buttocks.  An abnormal way of walking (abnormal gait).  Poor posture.  Muscle stiffness, specifically in the hamstrings. The hamstrings are in the backs of the thighs.  Weakness, numbness, or a tingling sensation in the legs.  Symptoms may get worse when standing, and they may temporarily get better when sitting down or bending forward. In some cases, there may be no symptoms of this condition. How is this diagnosed?  This condition may be diagnosed based on:  Your  symptoms.  Your medical history.  A physical exam. ? Your health care provider may push on certain areas to determine the source of your pain. ? You may be asked to bend forward, backward, and side to side so your health care provider can assess the severity of your pain and your range of motion.  Imaging tests, such as: ? X-rays. ? CT scan. ? MRI.  How is this treated? Treatment for this condition may include:  Resting. This may involve avoiding or modifying activities that put strain on your back until your symptoms improve.  Medicines to help relieve pain.  NSAIDs to help reduce swelling and discomfort.  Injections of medicine (cortisone) in your back. These injections can to help relieve pain and numbness.  A brace to stabilize and support your back.  Physical therapy. This may include working with an occupational therapist or physical therapist who can teach you how to reduce pressure on your back while you do everyday activities.  Surgery. This may be needed if: ? Other treatment methods do not improve your condition. ? Your symptoms do not go away after 3-6 months. ? You are unable to walk or stand. ? You have severe pain.  Follow these instructions at home: If you have a brace:  Wear it as told by your health care provider. Remove it only as told by your health care provider.  Do not let your brace get wet if it is not waterproof.  Keep the brace clean. Driving  Do not drive or operate heavy machinery until you  know how your pain medicine affects you.  Ask your health care provider when it is safe to drive if you have a back brace. Activity  Rest and return to your normal activities as told by your health care provider. Ask your health care provider what activities are safe for you.  Avoid activities that take a lot of effort (are strenuous) for as long as told by your health care provider.  Do exercises as told by your health care provider. General  instructions  Take over-the-counter and prescription medicines only as told by your health care provider.  If you have questions or concerns about safety while taking pain medicine, talk with your health care provider.  Do not use any tobacco products, such as cigarettes, chewing tobacco, and e-cigarettes. Tobacco can delay bone healing. If you need help quitting, ask your health care provider.  Keep all follow-up visits as told by your health care provider. This is important. How is this prevented?  Warm up and stretch before being active.  Cool down and stretch after being active.  Give your body time to rest between periods of activity.  Make sure to use equipment that fits you.  Be safe and responsible while being active to avoid falls.  Do at least 150 minutes of moderate-intensity exercise each week, such as brisk walking or water aerobics.  Maintain physical fitness, including: ? Strength. ? Flexibility. ? Cardiovascular fitness. ? Endurance. Contact a health care provider if:  You have pain that gets worse or does not get better. Get help right away if:  You have severe back pain.  You develop weakness or numbness in your legs.  You are unable to stand or walk. This information is not intended to replace advice given to you by your health care provider. Make sure you discuss any questions you have with your health care provider. Document Released: 11/13/2005 Document Revised: 07/20/2016 Document Reviewed: 08/24/2015 Elsevier Interactive Patient Education  [employer].

## 2017-12-20 DIAGNOSIS — H40053 Ocular hypertension, bilateral: Secondary | ICD-10-CM | POA: Diagnosis not present

## 2017-12-20 DIAGNOSIS — H35033 Hypertensive retinopathy, bilateral: Secondary | ICD-10-CM | POA: Diagnosis not present

## 2017-12-20 DIAGNOSIS — I7090 Unspecified atherosclerosis: Secondary | ICD-10-CM | POA: Diagnosis not present

## 2017-12-20 DIAGNOSIS — H402231 Chronic angle-closure glaucoma, bilateral, mild stage: Secondary | ICD-10-CM | POA: Diagnosis not present

## 2017-12-25 ENCOUNTER — Ambulatory Visit (HOSPITAL_COMMUNITY): Payer: Medicare Other | Admitting: Physical Therapy

## 2017-12-26 ENCOUNTER — Encounter (HOSPITAL_COMMUNITY): Payer: Self-pay

## 2017-12-26 ENCOUNTER — Ambulatory Visit (HOSPITAL_COMMUNITY): Payer: Medicare Other | Attending: Internal Medicine

## 2017-12-26 ENCOUNTER — Ambulatory Visit (HOSPITAL_COMMUNITY): Payer: Medicare Other

## 2017-12-26 DIAGNOSIS — M6281 Muscle weakness (generalized): Secondary | ICD-10-CM | POA: Diagnosis not present

## 2017-12-26 DIAGNOSIS — R29898 Other symptoms and signs involving the musculoskeletal system: Secondary | ICD-10-CM

## 2017-12-26 DIAGNOSIS — M25551 Pain in right hip: Secondary | ICD-10-CM

## 2017-12-26 NOTE — Therapy (Addendum)
[facility] [place], [place], [address] Phone: [phone]   Fax:  [phone]  Physical Therapy Evaluation  Patient Details  Name: Miranda [name] MRN: [id number] Date of Birth: 13-Aug-1934 Referring Provider: Arther Abbott, MD   Encounter Date: 12/26/2017  PT End of Session - 12/26/17 0850    Visit Number  1    Number of Visits  9    Date for PT Re-Evaluation  01/23/18    Authorization Type  Medicare Part A and B    Authorization Time Period  12/26/17 to 01/23/18    PT Start Time  0815    PT Stop Time  0853    PT Time Calculation (min)  38 min    Activity Tolerance  Patient tolerated treatment well    Behavior During Therapy  [facility] for tasks assessed/performed       Past Medical History:  Diagnosis Date  . Asthma   . Cancer ([place])   . Essential hypertension, benign   . Mixed hyperlipidemia   . Nephrolithiasis     Past Surgical History:  Procedure Laterality Date  . ABDOMINAL HYSTERECTOMY     excessive bleeding, no cancer   . Left knee arthroscopy due to torn ligament and cartiallege  2006   [facility]   . Left lung - lobectomy lower lobe - lung cancer      There were no vitals filed for this visit.   Subjective Assessment - 12/26/17 0818    Subjective  Pt states that she has been having some R hip pain. When she saw Dr. Aline Brochure, he told her it was her disc which was causing her pain to go down into her leg. She's been having the hip pain since Thanksgiving of insidious onset. She had a shot and was given medicine for muscle spasms but it did not improve. She does not have any low back pain. Her pain is worse at night and the pain is from her hip down to her knee on her anterior thigh. When she walks she feels like she hopping on it.     Limitations  Walking    How long can you sit comfortably?  no issues    How long can you stand comfortably?  no issues    How long can you walk comfortably?  unlimited    Patient Stated Goals  get rid of this pain    Currently in Pain?  No/denies         [facility] PT Assessment - 12/26/17 0001      Assessment   Medical Diagnosis  Parin in R leg, Lumbar Spondylosis, Spondylolisthesis of lumbar region, Arthritis R knee    Referring Provider  Arther Abbott, MD    Onset Date/Surgical Date  -- Thanksgiving 2018    Next MD Visit  March 2019    Prior Therapy  none      Balance Screen   Has the patient fallen in the past 6 months  Yes    How many times?  1 tripped when putting Christmas decorations up    Has the patient had a decrease in activity level because of a fear of falling?   No    Is the patient reluctant to leave their home because of a fear of falling?   No      Prior Function   Level of Independence  Independent    Vocation  Retired    Leisure  play senior games (bowl,  etc.), crafting, play cards      Sensation   Light Touch  Appears Intact      Posture/Postural Control   Posture/Postural Control  Postural limitations    Postural Limitations  Rounded Shoulders;Forward head;Increased thoracic kyphosis;Increased lumbar lordosis      ROM / Strength   AROM / PROM / Strength  AROM;Strength      AROM   AROM Assessment Site  Lumbar    Lumbar Flexion  25% limited    Lumbar Extension  excessive lordosis; has lumbar spondylosisthesis    Lumbar - Right Side Bend  WNL    Lumbar - Left Side Bend  WNL    Lumbar - Right Rotation  WNL    Lumbar - Left Rotation  WNL      Strength   Strength Assessment Site  Hip;Knee;Ankle    Right/Left Hip  Right;Left    Right Hip Flexion  4+/5    Right Hip Extension  4-/5    Right Hip ABduction  4-/5    Left Hip Flexion  4+/5    Left Hip Extension  4/5    Left Hip ABduction  4/5    Right/Left Knee  Right;Left    Right Knee Flexion  4+/5    Right Knee Extension  5/5    Left Knee Flexion  4+/5    Left Knee Extension  5/5    Right Ankle Dorsiflexion  5/5    Left Ankle Dorsiflexion  5/5      Flexibility    Soft Tissue Assessment /Muscle Length  yes    Hamstrings  WNL    Quadriceps  +Ely's BLE, R>L; recreated pt's pain on R    Piriformis  R tighter than L      Palpation   Palpation comment  increased tenderness and restrictions of R glutes (especially medius), piriformis, TFL, VLO, ITB      Ambulation/Gait   Ambulation Distance (Feet)  698 Feet 3MWT    Assistive device  None    Gait Pattern  Step-through pattern;Trendelenburg;Antalgic;Lateral trunk lean to right    Gait Comments  increased thigh pain to 5/10      Balance   Balance Assessed  Yes      Static Standing Balance   Static Standing - Balance Support  No upper extremity supported    Static Standing Balance -  Activities   Single Leg Stance - Right Leg;Single Leg Stance - Left Leg    Static Standing - Comment/# of Minutes  R: 30 sec L: 13 sec; Trendelenberg bil      Standardized Balance Assessment   Standardized Balance Assessment  Five Times Sit to Stand    Five times sit to stand comments   12sec, no UE           Objective measurements completed on examination: See above findings.        PT Education - 12/26/17 0850    Education provided  Yes    Education Details  exam findings, POC, HEP    Person(s) Educated  Patient    Methods  Explanation;Demonstration;Handout    Comprehension  Verbalized understanding;Returned demonstration       PT Short Term Goals - 12/26/17 0947      PT SHORT TERM GOAL #1   Title  Pt will be independent with HEP and perform consistently in order to maximize overall function and decrease pain.    Time  2    Period  Weeks  Status  New    Target Date  01/09/18      PT SHORT TERM GOAL #2   Title  Pt will have improved MMT by at least 1/2 grade or > of all muscle groups tested in order to decrease pain and maximize gait to allow pt to complete IADLs with greater ease.    Time  2    Period  Weeks    Status  New        PT Long Term Goals - 12/26/17 1021      PT LONG TERM GOAL  #1   Title  Pt will be able to perform 3MWT without exacerbation in R thigh pain and with min to no gait deviations in order to demo improved functional strength and pain.    Time  4    Period  Weeks    Status  New    Target Date  01/23/18      PT LONG TERM GOAL #2   Title  Pt will be able to perform bil SLS for 30 sec or > without compensations and without evidence of Trendelenberg sign to demo improved functional glute med strength in order to improve gait mechanics and functional tasks.    Time  4    Period  Weeks    Status  New      PT LONG TERM GOAL #3   Title  Pt will have -Ely's test of BLE and report no recreation of anterior this pain when testing the RLE to demo improved quad and hip flexor flexibility and decrease pt's overall c/o pain.    Time  4    Period  Weeks    Status  New      PT LONG TERM GOAL #4   Title  Pt will report 2/10 RLE pain at night or < to allow her to use the restroom and return to sleep at night with greater ease.     Time  4    Period  Weeks    Status  New             Plan - 12/26/17 0943    Clinical Impression Statement  Pt is pleasant 84YO F who presents to OPPT with c/o R hip pain that goes down her anterior thigh. Pt reports she was told that her hip pain is coming from her back as she has spondylolisthesis. Currently, pt presents with deficits in proximal hip strength, R>L, core strength, posture, gait, and functional strength as well as increased soft tissue restrictions of R gluteals, piriformis, TFL, ITB, and VLO. Pt also had +Ely's bil, R>L, and reported that it recreated her same R anterior thigh pain during testing. Pt has significant Trendelenberg during WB activities and has R trunk lean as compensation. Pt's main complaint is that she has her hip pain at night when in bed trying to change positions or return to sleep after getting up in the night. Pt needs skilled PT intervention to address these deficits in order to decrease pain and  maximize return to PLOF.    History and Personal Factors relevant to plan of care:  first time issue with this; otherwise healthy individual; in remission for lung cancer; HTN    Clinical Presentation  Stable    Clinical Presentation due to:  ROM, MMT, functional strength, gait, Trendelenberg, soft tissue restricitons, quad flexibility, core strength, posture    Clinical Decision Making  Low    Rehab Potential  Good  PT Frequency  2x / week    PT Duration  4 weeks    PT Treatment/Interventions  ADLs/Self Care Home Management;Cryotherapy;Electrical Stimulation;Moist Heat;Ultrasound;DME Instruction;Gait training;Stair training;Functional mobility training;Therapeutic activities;Therapeutic exercise;Balance training;Neuromuscular re-education;Patient/family education;Manual techniques;Passive range of motion;Dry needling;Energy conservation;Taping    PT Next Visit Plan  review goals and HEP; manual for glutes/piriformis/ITB/VLO, BLE and functional strengthening and core strengthening; posture education and strengthening    PT Home Exercise Plan  eval: SKTC, prone quad stretch    Consulted and Agree with Plan of Care  Patient       Patient will benefit from skilled therapeutic intervention in order to improve the following deficits and impairments:  Abnormal gait, Decreased activity tolerance, Decreased mobility, Decreased range of motion, Decreased strength, Difficulty walking, Hypomobility, Increased fascial restricitons, Increased muscle spasms, Impaired flexibility, Improper body mechanics, Postural dysfunction, Pain  Visit Diagnosis: Pain in right hip - Plan: PT plan of care cert/re-cert  Muscle weakness (generalized) - Plan: PT plan of care cert/re-cert  Other symptoms and signs involving the musculoskeletal system - Plan: PT plan of care cert/re-cert     Problem List Patient Active Problem List   Diagnosis Date Noted  . PSVT (paroxysmal supraventricular tachycardia) ([place])  01/05/2012  . Near syncope 12/05/2011  . Palpitations 12/05/2011  . Abnormal ECG 12/05/2011  . HYPERLIPIDEMIA 11/10/2006  . Essential hypertension, benign 11/10/2006      Geraldine Solar PT, DPT  [facility] [address] [place], [place], [address] Phone: [phone]   Fax:  [phone]  Name: Miranda [name] MRN: [id number] Date of Birth: 1933-12-25

## 2017-12-26 NOTE — Patient Instructions (Signed)
  Prone Quad Stretch  Lie down flat on your stomach. Wrap a strap (belt, towel, dog leash) around the top of one of your feet and pull the strap across your opposite shoulder so that your knee starts to curl up to your body. Pull until a stretch is felt across the front of your thigh.     Perform 1-2x/day, 3-5 stretches, holding for 30-60 seconds   SINGLE KNEE TO CHEST STRETCH - SKTC  While Lying on your back, hold your knee and gently pull it up towards your chest.  Perform 1-2x/day, 3-5 stretches, holding for 30-60 seconds

## 2017-12-28 ENCOUNTER — Ambulatory Visit (HOSPITAL_COMMUNITY): Payer: Medicare Other | Attending: Internal Medicine

## 2017-12-28 ENCOUNTER — Encounter (HOSPITAL_COMMUNITY): Payer: Medicare Other | Admitting: Physical Therapy

## 2017-12-28 ENCOUNTER — Encounter (HOSPITAL_COMMUNITY): Payer: Self-pay

## 2017-12-28 DIAGNOSIS — M25551 Pain in right hip: Secondary | ICD-10-CM | POA: Insufficient documentation

## 2017-12-28 DIAGNOSIS — R29898 Other symptoms and signs involving the musculoskeletal system: Secondary | ICD-10-CM | POA: Diagnosis not present

## 2017-12-28 DIAGNOSIS — M6281 Muscle weakness (generalized): Secondary | ICD-10-CM | POA: Insufficient documentation

## 2017-12-28 NOTE — Therapy (Signed)
[place], [place], [address] Phone: [phone]   Fax:  [phone]  Physical Therapy Treatment  Patient Details  Name: Miranda [name] MRN: [id number] Date of Birth: Oct 05, 1934 Referring Provider: Arther Abbott, MD   Encounter Date: 12/28/2017  PT End of Session - 12/28/17 1123    Visit Number  2    Number of Visits  9    Date for PT Re-Evaluation  01/23/18    Authorization Type  Medicare Part A and B    Authorization Time Period  12/26/17 to 01/23/18    PT Start Time  1121    PT Stop Time  1202    PT Time Calculation (min)  41 min    Activity Tolerance  Patient tolerated treatment well    Behavior During Therapy  [facility] for tasks assessed/performed       Past Medical History:  Diagnosis Date  . Asthma   . Cancer ([place])   . Essential hypertension, benign   . Mixed hyperlipidemia   . Nephrolithiasis     Past Surgical History:  Procedure Laterality Date  . ABDOMINAL HYSTERECTOMY     excessive bleeding, no cancer   . Left knee arthroscopy due to torn ligament and cartiallege  2006   [place]   . Left lung - lobectomy lower lobe - lung cancer      There were no vitals filed for this visit.  Subjective Assessment - 12/28/17 1123    Subjective  Pt states that her hip is still bothering her but feels the exercises are helping. Currently 5/10.    Limitations  Walking    How long can you sit comfortably?  no issues    How long can you stand comfortably?  no issues    How long can you walk comfortably?  unlimited    Patient Stated Goals  get rid of this pain    Currently in Pain?  Yes    Pain Score  5     Pain Location  Hip    Pain Orientation  Right    Pain Descriptors / Indicators  Sore    Pain Type  Chronic pain    Pain Onset  More than a month ago    Pain Frequency  Intermittent    Aggravating Factors   worse at night, being on it    Pain Relieving Factors  medicine, rest     Effect of Pain on  Daily Activities  no issues           [facility] Adult PT Treatment/Exercise - 12/28/17 0001      Exercises   Exercises  Knee/Hip      Knee/Hip Exercises: Stretches   Sports administrator  Right;3 reps;30 seconds    Quad Stretch Limitations  prone with rope    Piriformis Stretch  Right;3 reps;30 seconds    Other Knee/Hip Stretches  R SKTC 3x30"      Knee/Hip Exercises: Standing   Hip Abduction  Both;2 sets;10 reps    Abduction Limitations  RTB      Knee/Hip Exercises: Supine   Bridges  Both;2 sets;10 reps      Knee/Hip Exercises: Sidelying   Clams  x10 with RTB    Other Sidelying Knee/Hip Exercises  isometric clams with betl 10x10" holds      Manual Therapy   Manual Therapy  Myofascial release    Manual therapy comments  completed separate rest of treatment  Myofascial Release  in sidelying; MFR to R glute med          PT Education - 12/28/17 1203    Education provided  Yes    Education Details  reviewed goals, exercise technique, HEP    Person(s) Educated  Patient    Methods  Explanation;Demonstration;Handout    Comprehension  Verbalized understanding;Returned demonstration       PT Short Term Goals - 12/26/17 0947      PT SHORT TERM GOAL #1   Title  Pt will be independent with HEP and perform consistently in order to maximize overall function and decrease pain.    Time  2    Period  Weeks    Status  New    Target Date  01/09/18      PT SHORT TERM GOAL #2   Title  Pt will have improved MMT by at least 1/2 grade or > of all muscle groups tested in order to decrease pain and maximize gait to allow pt to complete IADLs with greater ease.    Time  2    Period  Weeks    Status  New        PT Long Term Goals - 12/26/17 1021      PT LONG TERM GOAL #1   Title  Pt will be able to perform 3MWT without exacerbation in R thigh pain and with min to no gait deviations in order to demo improved functional strength and pain.    Time  4    Period  Weeks    Status  New     Target Date  01/23/18      PT LONG TERM GOAL #2   Title  Pt will be able to perform bil SLS for 30 sec or > without compensations and without evidence of Trendelenberg sign to demo improved functional glute med strength in order to improve gait mechanics and functional tasks.    Time  4    Period  Weeks    Status  New      PT LONG TERM GOAL #3   Title  Pt will have -Ely's test of BLE and report no recreation of anterior this pain when testing the RLE to demo improved quad and hip flexor flexibility and decrease pt's overall c/o pain.    Time  4    Period  Weeks    Status  New      PT LONG TERM GOAL #4   Title  Pt will report 2/10 RLE pain at night or < to allow her to use the restroom and return to sleep at night with greater ease.     Time  4    Period  Weeks    Status  New            Plan - 12/28/17 1204    Clinical Impression Statement  Began session by reviewing goals and issuing copy of eval with no f/u questions afterwards. Pt reports that her RLE has had increased swelling since last session; she denies any heart conditions and states that it will intermittently swell but not this much. She does report that she was sitting with legs in dependent position for longer the last few days due to the cold weather; feel this increased swelling is due to pt sitting in that dependent leg position but told pt to continue to monitor it and inform treating therapist in future sessions. Rest of session focused on improving flexibility, strength,  and addressing soft tissue restrictions of R hip. Pt with palpable trigger points in R glute med and reported referred pain/recreation of pain down anterior thigh. No pain reported at EOS and was 5/10 at beginning.    Rehab Potential  Good    PT Frequency  2x / week    PT Duration  4 weeks    PT Treatment/Interventions  ADLs/Self Care Home Management;Cryotherapy;Electrical Stimulation;Moist Heat;Ultrasound;DME Instruction;Gait training;Stair  training;Functional mobility training;Therapeutic activities;Therapeutic exercise;Balance training;Neuromuscular re-education;Patient/family education;Manual techniques;Passive range of motion;Dry needling;Energy conservation;Taping    PT Next Visit Plan  continue manual for glutes/piriformis/ITB/VLO, BLE and functional strengthening and core strengthening; posture education and strengthening    PT Home Exercise Plan  eval: SKTC, prone quad stretch; 2/1: piriformis stretch, iso clams with belt    Consulted and Agree with Plan of Care  Patient       Patient will benefit from skilled therapeutic intervention in order to improve the following deficits and impairments:  Abnormal gait, Decreased activity tolerance, Decreased mobility, Decreased range of motion, Decreased strength, Difficulty walking, Hypomobility, Increased fascial restricitons, Increased muscle spasms, Impaired flexibility, Improper body mechanics, Postural dysfunction, Pain  Visit Diagnosis: Pain in right hip  Muscle weakness (generalized)  Other symptoms and signs involving the musculoskeletal system     Problem List Patient Active Problem List   Diagnosis Date Noted  . PSVT (paroxysmal supraventricular tachycardia) ([place]) 01/05/2012  . Near syncope 12/05/2011  . Palpitations 12/05/2011  . Abnormal ECG 12/05/2011  . HYPERLIPIDEMIA 11/10/2006  . Essential hypertension, benign 11/10/2006        Geraldine Solar PT, DPT  [facility] [address] [place], [place], [address] Phone: [phone]   Fax:  [phone]  Name: KIARALIZ RAFUSE MRN: [id number] Date of Birth: 06-Aug-1934

## 2017-12-28 NOTE — Patient Instructions (Addendum)
  ELASTIC BAND - SIDELYING CLAM - [place]HELL   While lying on your side with your knees bent and a BELT wrapped around your knees, draw up the top knee while keeping contact of your feet together as shown. Hold for 10 seconds against the belt (will not be able to raise your knee as high as he is in the photo)  Do not let your pelvis roll back during the lifting movement.    Perform 1x/day, 2-3 sets of 10 holding for 10-15 seconds   PIRIFORMIS STRETCH - MODIFIED  While lying on your back, hold your knee with your opposite hand and draw your knee up and over towards your opposite shoulder. Pull right knee towards left shoulder  Perform 1x/day, 3-5 stretches holding for 30-60 seconds

## 2017-12-31 ENCOUNTER — Encounter (HOSPITAL_COMMUNITY): Payer: Medicare Other

## 2017-12-31 DIAGNOSIS — H04123 Dry eye syndrome of bilateral lacrimal glands: Secondary | ICD-10-CM | POA: Diagnosis not present

## 2017-12-31 DIAGNOSIS — H2013 Chronic iridocyclitis, bilateral: Secondary | ICD-10-CM | POA: Diagnosis not present

## 2017-12-31 DIAGNOSIS — H40053 Ocular hypertension, bilateral: Secondary | ICD-10-CM | POA: Diagnosis not present

## 2017-12-31 DIAGNOSIS — H402231 Chronic angle-closure glaucoma, bilateral, mild stage: Secondary | ICD-10-CM | POA: Diagnosis not present

## 2018-01-02 ENCOUNTER — Encounter (HOSPITAL_COMMUNITY): Payer: Self-pay

## 2018-01-02 ENCOUNTER — Ambulatory Visit (HOSPITAL_COMMUNITY): Payer: Medicare Other

## 2018-01-02 ENCOUNTER — Encounter (HOSPITAL_COMMUNITY): Payer: Medicare Other

## 2018-01-02 DIAGNOSIS — M25551 Pain in right hip: Secondary | ICD-10-CM | POA: Diagnosis not present

## 2018-01-02 DIAGNOSIS — R29898 Other symptoms and signs involving the musculoskeletal system: Secondary | ICD-10-CM | POA: Diagnosis not present

## 2018-01-02 DIAGNOSIS — M6281 Muscle weakness (generalized): Secondary | ICD-10-CM

## 2018-01-02 NOTE — Therapy (Signed)
[place], [place], [address] Phone: [phone]   Fax:  [phone]  Physical Therapy Treatment  Patient Details  Name: Miranda [name] MRN: [id number] Date of Birth: [birth year] Referring Provider: Arther Abbott, MD   Encounter Date: 01/02/2018  PT End of Session - 01/02/18 1032    Visit Number  3    Number of Visits  9    Date for PT Re-Evaluation  01/23/18    Authorization Type  Medicare Part A and B    Authorization Time Period  12/26/17 to 01/23/18    PT Start Time  1031    PT Stop Time  1112    PT Time Calculation (min)  41 min    Activity Tolerance  Patient tolerated treatment well    Behavior During Therapy  [facility] for tasks assessed/performed       Past Medical History:  Diagnosis Date  . Asthma   . Cancer ([place])   . Essential hypertension, benign   . Mixed hyperlipidemia   . Nephrolithiasis     Past Surgical History:  Procedure Laterality Date  . ABDOMINAL HYSTERECTOMY     excessive bleeding, no cancer   . Left knee arthroscopy due to torn ligament and cartiallege  2006   [place]   . Left lung - lobectomy lower lobe - lung cancer      There were no vitals filed for this visit.  Subjective Assessment - 01/02/18 1032    Subjective  Pt reports that she's feeling fine. Only a little bit of pain in her R hip. She thinks that therapy is helping so far.     Limitations  Walking    How long can you sit comfortably?  no issues    How long can you stand comfortably?  no issues    How long can you walk comfortably?  unlimited    Patient Stated Goals  get rid of this pain    Currently in Pain?  Yes    Pain Score  2     Pain Location  Hip    Pain Orientation  Right    Pain Descriptors / Indicators  Sore    Pain Type  Chronic pain    Pain Onset  More than a month ago    Pain Frequency  Intermittent    Aggravating Factors   worse at night, being on it    Pain Relieving Factors  medicince, rest     Effect of Pain on Daily Activities  no issues            [facility] Adult PT Treatment/Exercise - 01/02/18 0001      Knee/Hip Exercises: Stretches   Piriformis Stretch  Right;3 reps;30 seconds    Other Knee/Hip Stretches  R SKTC 3x30"    Other Knee/Hip Stretches  seated lumbar flexion stretch 2x15"      Knee/Hip Exercises: Standing   Hip Abduction  Both;2 sets;10 reps    Abduction Limitations  GTB    Other Standing Knee Exercises  sidestepping with GTB 61ft x2RT      Knee/Hip Exercises: Seated   Sit to Sand  2 sets;10 reps;without UE support GTb around knees      Knee/Hip Exercises: Supine   Bridges  Both;2 sets;15 reps      Knee/Hip Exercises: Sidelying   Clams  2x10 GTB, RLE only      Manual Therapy   Manual Therapy  Myofascial release  Manual therapy comments  completed separate rest of treatment    Myofascial Release  in sidelying; MFR to R glute med, glute max          PT Education - 01/02/18 1034    Education provided  Yes    Person(s) Educated  Patient    Methods  Explanation    Comprehension  Verbalized understanding       PT Short Term Goals - 12/26/17 0947      PT SHORT TERM GOAL #1   Title  Pt will be independent with HEP and perform consistently in order to maximize overall function and decrease pain.    Time  2    Period  Weeks    Status  New    Target Date  01/09/18      PT SHORT TERM GOAL #2   Title  Pt will have improved MMT by at least 1/2 grade or > of all muscle groups tested in order to decrease pain and maximize gait to allow pt to complete IADLs with greater ease.    Time  2    Period  Weeks    Status  New        PT Long Term Goals - 12/26/17 1021      PT LONG TERM GOAL #1   Title  Pt will be able to perform 3MWT without exacerbation in R thigh pain and with min to no gait deviations in order to demo improved functional strength and pain.    Time  4    Period  Weeks    Status  New    Target Date  01/23/18      PT LONG  TERM GOAL #2   Title  Pt will be able to perform bil SLS for 30 sec or > without compensations and without evidence of Trendelenberg sign to demo improved functional glute med strength in order to improve gait mechanics and functional tasks.    Time  4    Period  Weeks    Status  New      PT LONG TERM GOAL #3   Title  Pt will have -Ely's test of BLE and report no recreation of anterior this pain when testing the RLE to demo improved quad and hip flexor flexibility and decrease pt's overall c/o pain.    Time  4    Period  Weeks    Status  New      PT LONG TERM GOAL #4   Title  Pt will report 2/10 RLE pain at night or < to allow her to use the restroom and return to sleep at night with greater ease.     Time  4    Period  Weeks    Status  New            Plan - 01/02/18 1113    Clinical Impression Statement  Pt presenting to therapy with steadily decreasing pain. Continued with gluteal and functional strengthening this date with no reports of pain, just muscular fatigue. Ended with MFR to pt's R glute med; no referral symptoms this date but pt with increased tenderness and restrictions throughout. No pain reported at EOS.    Rehab Potential  Good    PT Frequency  2x / week    PT Duration  4 weeks    PT Treatment/Interventions  ADLs/Self Care Home Management;Cryotherapy;Electrical Stimulation;Moist Heat;Ultrasound;DME Instruction;Gait training;Stair training;Functional mobility training;Therapeutic activities;Therapeutic exercise;Balance training;Neuromuscular re-education;Patient/family education;Manual techniques;Passive range of motion;Dry needling;Energy  conservation;Taping    PT Next Visit Plan  continue manual for glutes/piriformis/ITB/VLO, continue and progress BLE and functional strengthening and core strengthening; posture education and strengthening    PT Home Exercise Plan  eval: SKTC, prone quad stretch; 2/1: piriformis stretch, iso clams with belt; 2/6: bridging    Consulted  and Agree with Plan of Care  Patient       Patient will benefit from skilled therapeutic intervention in order to improve the following deficits and impairments:  Abnormal gait, Decreased activity tolerance, Decreased mobility, Decreased range of motion, Decreased strength, Difficulty walking, Hypomobility, Increased fascial restricitons, Increased muscle spasms, Impaired flexibility, Improper body mechanics, Postural dysfunction, Pain  Visit Diagnosis: Pain in right hip  Muscle weakness (generalized)  Other symptoms and signs involving the musculoskeletal system     Problem List Patient Active Problem List   Diagnosis Date Noted  . PSVT (paroxysmal supraventricular tachycardia) ([place]) 01/05/2012  . Near syncope 12/05/2011  . Palpitations 12/05/2011  . Abnormal ECG 12/05/2011  . HYPERLIPIDEMIA 11/10/2006  . Essential hypertension, benign 11/10/2006      Geraldine Solar PT, DPT  [facility] [address] [place], [place], [address] Phone: [phone]   Fax:  [phone]  Name: Miranda [name] MRN: [id number] Date of Birth: [birth year]

## 2018-01-02 NOTE — Patient Instructions (Signed)
  BRIDGING  While lying on your back with knees bent, tighten your lower abdominals, squeeze your buttocks and then raise your buttocks off the floor/bed as creating a "Bridge" with your body. Hold and then lower yourself and repeat.  Perform 1x/day, 2-3 sets of 10-15 reps each

## 2018-01-04 ENCOUNTER — Ambulatory Visit (HOSPITAL_COMMUNITY): Payer: Medicare Other

## 2018-01-04 ENCOUNTER — Encounter (HOSPITAL_COMMUNITY): Payer: Medicare Other | Admitting: Physical Therapy

## 2018-01-04 ENCOUNTER — Encounter (HOSPITAL_COMMUNITY): Payer: Self-pay

## 2018-01-04 DIAGNOSIS — R29898 Other symptoms and signs involving the musculoskeletal system: Secondary | ICD-10-CM

## 2018-01-04 DIAGNOSIS — M25551 Pain in right hip: Secondary | ICD-10-CM

## 2018-01-04 DIAGNOSIS — M6281 Muscle weakness (generalized): Secondary | ICD-10-CM

## 2018-01-04 NOTE — Therapy (Signed)
[facility], [place], [address] Phone: [phone]   Fax:  [phone]  Physical Therapy Treatment  Patient Details  Name: Miranda [name] MRN: [id number] Date of Birth: 1934/10/12 Referring Provider: Arther Abbott, MD   Encounter Date: 01/04/2018  PT End of Session - 01/04/18 1356    Visit Number  4    Number of Visits  9    Date for PT Re-Evaluation  01/23/18    Authorization Type  Medicare Part A and B    Authorization Time Period  12/26/17 to 01/23/18    PT Start Time  1350    PT Stop Time  1434    PT Time Calculation (min)  44 min    Activity Tolerance  Patient tolerated treatment well    Behavior During Therapy  [facility] for tasks assessed/performed       Past Medical History:  Diagnosis Date  . Asthma   . Cancer ([place])   . Essential hypertension, benign   . Mixed hyperlipidemia   . Nephrolithiasis     Past Surgical History:  Procedure Laterality Date  . ABDOMINAL HYSTERECTOMY     excessive bleeding, no cancer   . Left knee arthroscopy due to torn ligament and cartiallege  2006   [place]   . Left lung - lobectomy lower lobe - lung cancer      There were no vitals filed for this visit.  Subjective Assessment - 01/04/18 1354    Subjective  Pt stated her hip is feeling good today, thinks her therapy is helping.  Current pain scale 2/10 intermittent sore pain.    Patient Stated Goals  get rid of this pain    Currently in Pain?  Yes    Pain Score  2     Pain Location  Hip    Pain Orientation  Right    Pain Descriptors / Indicators  Sore    Pain Type  Chronic pain    Pain Onset  More than a month ago    Pain Frequency  Intermittent    Aggravating Factors   worse at night, being on it    Pain Relieving Factors  medicine, rest    Effect of Pain on Daily Activities  no issues          [facility] Adult PT Treatment/Exercise - 01/04/18 0001      Knee/Hip Exercises: Standing   Functional Squat  10  reps front of chair for mechanics    SLS  Lt 23", Rt29" max of 3    Other Standing Knee Exercises  sidestepping with GTB 86ft x2RT      Knee/Hip Exercises: Seated   Sit to Sand  10 reps;without UE support      Knee/Hip Exercises: Supine   Bridges  Both;2 sets;15 reps      Knee/Hip Exercises: Sidelying   Hip ABduction  15 reps    Clams  15 GTB      Manual Therapy   Manual Therapy  Myofascial release    Manual therapy comments  completed separate rest of treatment    Myofascial Release  in sidelying; MFR to R glute med, glute max               PT Short Term Goals - 12/26/17 0947      PT SHORT TERM GOAL #1   Title  Pt will be independent with HEP and perform consistently in order to maximize overall function and  decrease pain.    Time  2    Period  Weeks    Status  New    Target Date  01/09/18      PT SHORT TERM GOAL #2   Title  Pt will have improved MMT by at least 1/2 grade or > of all muscle groups tested in order to decrease pain and maximize gait to allow pt to complete IADLs with greater ease.    Time  2    Period  Weeks    Status  New        PT Long Term Goals - 12/26/17 1021      PT LONG TERM GOAL #1   Title  Pt will be able to perform 3MWT without exacerbation in R thigh pain and with min to no gait deviations in order to demo improved functional strength and pain.    Time  4    Period  Weeks    Status  New    Target Date  01/23/18      PT LONG TERM GOAL #2   Title  Pt will be able to perform bil SLS for 30 sec or > without compensations and without evidence of Trendelenberg sign to demo improved functional glute med strength in order to improve gait mechanics and functional tasks.    Time  4    Period  Weeks    Status  New      PT LONG TERM GOAL #3   Title  Pt will have -Ely's test of BLE and report no recreation of anterior this pain when testing the RLE to demo improved quad and hip flexor flexibility and decrease pt's overall c/o pain.    Time   4    Period  Weeks    Status  New      PT LONG TERM GOAL #4   Title  Pt will report 2/10 RLE pain at night or < to allow her to use the restroom and return to sleep at night with greater ease.     Time  4    Period  Weeks    Status  New            Plan - 01/04/18 1435    Clinical Impression Statement  Pt progressing well towards therapy with reports of pain reducing and compliance wiht HEP.  Continued session focus wiht gluteal strengthening and manual technqiues to address tightness.  Added squats for functional strenghtening and SLS to improve hip stabilty.  EOS with manual myofascial release techniques to address tightness with Rt glut med and piriformis.  No reports of pain through session.      Rehab Potential  Good    PT Frequency  2x / week    PT Duration  4 weeks    PT Treatment/Interventions  ADLs/Self Care Home Management;Cryotherapy;Electrical Stimulation;Moist Heat;Ultrasound;DME Instruction;Gait training;Stair training;Functional mobility training;Therapeutic activities;Therapeutic exercise;Balance training;Neuromuscular re-education;Patient/family education;Manual techniques;Passive range of motion;Dry needling;Energy conservation;Taping    PT Next Visit Plan  continue manual for glutes/piriformis/ITB/VLO, continue and progress BLE and functional strengthening and core strengthening; posture education and strengthening    PT Home Exercise Plan  eval: SKTC, prone quad stretch; 2/1: piriformis stretch, iso clams with belt; 2/6: bridging       Patient will benefit from skilled therapeutic intervention in order to improve the following deficits and impairments:  Abnormal gait, Decreased activity tolerance, Decreased mobility, Decreased range of motion, Decreased strength, Difficulty walking, Hypomobility, Increased fascial restricitons, Increased muscle spasms,  Impaired flexibility, Improper body mechanics, Postural dysfunction, Pain  Visit Diagnosis: Pain in right  hip  Muscle weakness (generalized)  Other symptoms and signs involving the musculoskeletal system     Problem List Patient Active Problem List   Diagnosis Date Noted  . PSVT (paroxysmal supraventricular tachycardia) ([place]) 01/05/2012  . Near syncope 12/05/2011  . Palpitations 12/05/2011  . Abnormal ECG 12/05/2011  . HYPERLIPIDEMIA 11/10/2006  . Essential hypertension, benign 11/10/2006   Ihor Austin, [place]; [place]  Aldona Lento 01/04/2018, 2:39 PM  [facility] [address] [place], [place], [address] Phone: [phone]   Fax:  [phone]  Name: AUDINE MANGIONE MRN: [id number] Date of Birth: 12/16/1933

## 2018-01-07 ENCOUNTER — Encounter (HOSPITAL_COMMUNITY): Payer: Medicare Other

## 2018-01-09 ENCOUNTER — Ambulatory Visit (HOSPITAL_COMMUNITY): Payer: Medicare Other

## 2018-01-09 ENCOUNTER — Ambulatory Visit (HOSPITAL_COMMUNITY): Payer: Medicare Other | Admitting: Physical Therapy

## 2018-01-09 ENCOUNTER — Encounter (HOSPITAL_COMMUNITY): Payer: Self-pay

## 2018-01-09 DIAGNOSIS — R29898 Other symptoms and signs involving the musculoskeletal system: Secondary | ICD-10-CM

## 2018-01-09 DIAGNOSIS — M25551 Pain in right hip: Secondary | ICD-10-CM | POA: Diagnosis not present

## 2018-01-09 DIAGNOSIS — M6281 Muscle weakness (generalized): Secondary | ICD-10-CM | POA: Diagnosis not present

## 2018-01-09 NOTE — Therapy (Signed)
[place], [place], [address] Phone: [phone]   Fax:  [phone]  Physical Therapy Treatment  Patient Details  Name: Miranda [name] MRN: [id number] Date of Birth: December 02, 1933 Referring Provider: Arther Abbott, MD   Encounter Date: 01/09/2018  PT End of Session - 01/09/18 0909    Visit Number  5    Number of Visits  9    Date for PT Re-Evaluation  01/23/18    Authorization Type  Medicare Part A and B    Authorization Time Period  12/26/17 to 01/23/18    PT Start Time  0904    PT Stop Time  0945    PT Time Calculation (min)  41 min    Activity Tolerance  Patient tolerated treatment well    Behavior During Therapy  [facility] for tasks assessed/performed       Past Medical History:  Diagnosis Date  . Asthma   . Cancer ([place])   . Essential hypertension, benign   . Mixed hyperlipidemia   . Nephrolithiasis     Past Surgical History:  Procedure Laterality Date  . ABDOMINAL HYSTERECTOMY     excessive bleeding, no cancer   . Left knee arthroscopy due to torn ligament and cartiallege  2006   [facility]   . Left lung - lobectomy lower lobe - lung cancer      There were no vitals filed for this visit.  Subjective Assessment - 01/09/18 0908    Subjective  Pt stated her hip is feeling good today, reports imporved toleranced especially at night.  Current pain scale 3/10 intermittent sore Rt hip pain.      Patient Stated Goals  get rid of this pain    Currently in Pain?  Yes    Pain Score  3     Pain Location  Hip    Pain Orientation  Right    Pain Descriptors / Indicators  Sore    Pain Type  Chronic pain    Pain Onset  More than a month ago    Pain Frequency  Intermittent    Aggravating Factors   worse at night, being on it    Pain Relieving Factors  medicine, rest    Effect of Pain on Daily Activities  no issues                      [facility] Adult PT Treatment/Exercise - 01/09/18 0001      Knee/Hip Exercises: Standing   Functional Squat  15 reps front of chair for mechanics    SLS  3 sets    Other Standing Knee Exercises  sidestepping with GTB 50ft x2RT    Other Standing Knee Exercises  tandem stance 3x 30"; 2sets with palvo RTB 10x each      Knee/Hip Exercises: Supine   Bridges  Both;2 sets;15 reps split stance      Knee/Hip Exercises: Sidelying   Hip ABduction  15 reps 2#    Clams  15 GTB      Manual Therapy   Manual Therapy  Myofascial release    Manual therapy comments  completed separate rest of treatment    Myofascial Release  in sidelying; MFR to R glute med, glute max               PT Short Term Goals - 12/26/17 0947      PT SHORT TERM GOAL #1   Title  Pt  will be independent with HEP and perform consistently in order to maximize overall function and decrease pain.    Time  2    Period  Weeks    Status  New    Target Date  01/09/18      PT SHORT TERM GOAL #2   Title  Pt will have improved MMT by at least 1/2 grade or > of all muscle groups tested in order to decrease pain and maximize gait to allow pt to complete IADLs with greater ease.    Time  2    Period  Weeks    Status  New        PT Long Term Goals - 12/26/17 1021      PT LONG TERM GOAL #1   Title  Pt will be able to perform 3MWT without exacerbation in R thigh pain and with min to no gait deviations in order to demo improved functional strength and pain.    Time  4    Period  Weeks    Status  New    Target Date  01/23/18      PT LONG TERM GOAL #2   Title  Pt will be able to perform bil SLS for 30 sec or > without compensations and without evidence of Trendelenberg sign to demo improved functional glute med strength in order to improve gait mechanics and functional tasks.    Time  4    Period  Weeks    Status  New      PT LONG TERM GOAL #3   Title  Pt will have -Ely's test of BLE and report no recreation of anterior this pain when testing the RLE to demo improved quad and hip  flexor flexibility and decrease pt's overall c/o pain.    Time  4    Period  Weeks    Status  New      PT LONG TERM GOAL #4   Title  Pt will report 2/10 RLE pain at night or < to allow her to use the restroom and return to sleep at night with greater ease.     Time  4    Period  Weeks    Status  New            Plan - 01/09/18 1059    Clinical Impression Statement  Pt progressing well towards therapy with minimal reports of intermittent pain and improved tolerance for night time pain.  Continued with gluteal strengthening and manual techniques to address restrictions.  Progressed to increased demand for gluteal strengthneing with split stance bridges, added 2# with abduction exercise and began tandem stance with core strengthening exercise.  Pt tolerated well with new exercises, no reports of pain through session.  Noted tightness reduced following manual soft tissue mobilization to Rt glut med and piriformis.    Rehab Potential  Good    PT Frequency  2x / week    PT Duration  4 weeks    PT Treatment/Interventions  ADLs/Self Care Home Management;Cryotherapy;Electrical Stimulation;Moist Heat;Ultrasound;DME Instruction;Gait training;Stair training;Functional mobility training;Therapeutic activities;Therapeutic exercise;Balance training;Neuromuscular re-education;Patient/family education;Manual techniques;Passive range of motion;Dry needling;Energy conservation;Taping    PT Next Visit Plan  continue manual for glutes/piriformis/ITB/VLO, continue and progress BLE and functional strengthening and core strengthening; posture education and strengthening    PT Home Exercise Plan  eval: SKTC, prone quad stretch; 2/1: piriformis stretch, iso clams with belt; 2/6: bridging       Patient will benefit from skilled therapeutic  intervention in order to improve the following deficits and impairments:  Abnormal gait, Decreased activity tolerance, Decreased mobility, Decreased range of motion, Decreased  strength, Difficulty walking, Hypomobility, Increased fascial restricitons, Increased muscle spasms, Impaired flexibility, Improper body mechanics, Postural dysfunction, Pain  Visit Diagnosis: Pain in right hip  Muscle weakness (generalized)  Other symptoms and signs involving the musculoskeletal system     Problem List Patient Active Problem List   Diagnosis Date Noted  . PSVT (paroxysmal supraventricular tachycardia) ([place]) 01/05/2012  . Near syncope 12/05/2011  . Palpitations 12/05/2011  . Abnormal ECG 12/05/2011  . HYPERLIPIDEMIA 11/10/2006  . Essential hypertension, benign 11/10/2006   Ihor Austin, [place]; [place]  Aldona Lento 01/09/2018, 11:06 AM  [place], [place], [address] Phone: [phone]   Fax:  [phone]  Name: KUUIPO ANZALDO MRN: [id number] Date of Birth: 1934-10-22

## 2018-01-11 ENCOUNTER — Encounter (HOSPITAL_COMMUNITY): Payer: Self-pay

## 2018-01-11 ENCOUNTER — Ambulatory Visit (HOSPITAL_COMMUNITY): Payer: Medicare Other

## 2018-01-11 ENCOUNTER — Encounter (HOSPITAL_COMMUNITY): Payer: Medicare Other | Admitting: Physical Therapy

## 2018-01-11 DIAGNOSIS — R29898 Other symptoms and signs involving the musculoskeletal system: Secondary | ICD-10-CM | POA: Diagnosis not present

## 2018-01-11 DIAGNOSIS — M25551 Pain in right hip: Secondary | ICD-10-CM | POA: Diagnosis not present

## 2018-01-11 DIAGNOSIS — M6281 Muscle weakness (generalized): Secondary | ICD-10-CM

## 2018-01-11 NOTE — Patient Instructions (Signed)
Band Walk: Side Stepping    Tie band around legs, just above knees. Step ___ feet to one side, then step back to start. Repeat ___ feet per session. Note: Small towel between band and skin eases rubbing.  http://plyo.exer.us/76   Copyright  VHI. All rights reserved.

## 2018-01-11 NOTE — Therapy (Signed)
[place], [place], [address] Phone: [phone]   Fax:  [phone]  Physical Therapy Treatment  Patient Details  Name: Miranda [name] MRN: [id number] Date of Birth: 08-05-1934 Referring Provider: Arther Abbott, MD   Encounter Date: 01/11/2018  PT End of Session - 01/11/18 0900    Visit Number  6    Number of Visits  9    Date for PT Re-Evaluation  01/23/18    Authorization Type  Medicare Part A and B    Authorization Time Period  12/26/17 to 01/23/18    PT Start Time  0855    PT Stop Time  0939    PT Time Calculation (min)  44 min    Activity Tolerance  Patient tolerated treatment well    Behavior During Therapy  [facility] for tasks assessed/performed       Past Medical History:  Diagnosis Date  . Asthma   . Cancer ([place])   . Essential hypertension, benign   . Mixed hyperlipidemia   . Nephrolithiasis     Past Surgical History:  Procedure Laterality Date  . ABDOMINAL HYSTERECTOMY     excessive bleeding, no cancer   . Left knee arthroscopy due to torn ligament and cartiallege  2006   [facility]   . Left lung - lobectomy lower lobe - lung cancer      There were no vitals filed for this visit.  Subjective Assessment - 01/11/18 0859    Subjective  Pt stated she has increased pain from weight bearing yesterday, currently no reports of pain today.      Patient Stated Goals  get rid of this pain    Currently in Pain?  No/denies                      [facility] Adult PT Treatment/Exercise - 01/11/18 0001      Knee/Hip Exercises: Standing   Forward Lunges  15 reps 6in step    Functional Squat  15 reps front of chair for mechanics    SLS  Lt 35", Rt 28" max of 3    Other Standing Knee Exercises  sidestepping with GTB 69ft x2RT; postural strengthening RTB (scapular retraction, rows, shoulder extension) 10x each    Other Standing Knee Exercises  tandem stance on foam 3x 30"; 2sets with palvo RTB 15x  each      Knee/Hip Exercises: Supine   Bridges  Both;2 sets;15 reps split stance      Knee/Hip Exercises: Sidelying   Hip ABduction  15 reps 2#      Manual Therapy   Manual Therapy  Myofascial release    Manual therapy comments  completed separate rest of treatment    Myofascial Release  in sidelying; MFR to R glute med, glute max             PT Education - 01/11/18 0900    Education provided  Yes    Education Details  Pt educated on importance of posture to improve weight bearing with LE    Person(s) Educated  Patient    Methods  Explanation;Demonstration    Comprehension  Verbalized understanding       PT Short Term Goals - 12/26/17 0947      PT SHORT TERM GOAL #1   Title  Pt will be independent with HEP and perform consistently in order to maximize overall function and decrease pain.    Time  2  Period  Weeks    Status  New    Target Date  01/09/18      PT SHORT TERM GOAL #2   Title  Pt will have improved MMT by at least 1/2 grade or > of all muscle groups tested in order to decrease pain and maximize gait to allow pt to complete IADLs with greater ease.    Time  2    Period  Weeks    Status  New        PT Long Term Goals - 12/26/17 1021      PT LONG TERM GOAL #1   Title  Pt will be able to perform 3MWT without exacerbation in R thigh pain and with min to no gait deviations in order to demo improved functional strength and pain.    Time  4    Period  Weeks    Status  New    Target Date  01/23/18      PT LONG TERM GOAL #2   Title  Pt will be able to perform bil SLS for 30 sec or > without compensations and without evidence of Trendelenberg sign to demo improved functional glute med strength in order to improve gait mechanics and functional tasks.    Time  4    Period  Weeks    Status  New      PT LONG TERM GOAL #3   Title  Pt will have -Ely's test of BLE and report no recreation of anterior this pain when testing the RLE to demo improved quad and  hip flexor flexibility and decrease pt's overall c/o pain.    Time  4    Period  Weeks    Status  New      PT LONG TERM GOAL #4   Title  Pt will report 2/10 RLE pain at night or < to allow her to use the restroom and return to sleep at night with greater ease.     Time  4    Period  Weeks    Status  New            Plan - 01/11/18 5784    Clinical Impression Statement  Continued session focus with proximal hip gluteal strengthening.  Pt educated on importance of posture to improve weight beaing wiht LE to reduce stress.  Increased CKC for functional strengthening, pt able to tolerate well with new exercises with minimal cueing for form.  EOS with manual to address soft tissue restrictions Rt gluteal region.  No reports of pain through session.      Rehab Potential  Good    PT Frequency  2x / week    PT Duration  4 weeks    PT Treatment/Interventions  ADLs/Self Care Home Management;Cryotherapy;Electrical Stimulation;Moist Heat;Ultrasound;DME Instruction;Gait training;Stair training;Functional mobility training;Therapeutic activities;Therapeutic exercise;Balance training;Neuromuscular re-education;Patient/family education;Manual techniques;Passive range of motion;Dry needling;Energy conservation;Taping    PT Next Visit Plan  Begin vector stance and forward/lateral step ups for functional strenghteing.  Continues functional strenghtening, core and postural strengthenig and balance training wiht manual for gluteal region.    PT Home Exercise Plan  eval: SKTC, prone quad stretch; 2/1: piriformis stretch, iso clams with belt; 2/6: bridging; 01/11/18: sidestep with theraband       Patient will benefit from skilled therapeutic intervention in order to improve the following deficits and impairments:  Abnormal gait, Decreased activity tolerance, Decreased mobility, Decreased range of motion, Decreased strength, Difficulty walking, Hypomobility, Increased fascial restricitons, Increased muscle  spasms, Impaired flexibility, Improper body mechanics, Postural dysfunction, Pain  Visit Diagnosis: Pain in right hip  Muscle weakness (generalized)  Other symptoms and signs involving the musculoskeletal system     Problem List Patient Active Problem List   Diagnosis Date Noted  . PSVT (paroxysmal supraventricular tachycardia) ([place]) 01/05/2012  . Near syncope 12/05/2011  . Palpitations 12/05/2011  . Abnormal ECG 12/05/2011  . HYPERLIPIDEMIA 11/10/2006  . Essential hypertension, benign 11/10/2006   Ihor Austin, [place]; [place]  Aldona Lento 01/11/2018, 9:43 AM  [place], [place], [address] Phone: [phone]   Fax:  [phone]  Name: Miranda [name] MRN: [id number] Date of Birth: 1934/01/30

## 2018-01-14 ENCOUNTER — Encounter (HOSPITAL_COMMUNITY): Payer: Medicare Other

## 2018-01-16 ENCOUNTER — Telehealth (HOSPITAL_COMMUNITY): Payer: Self-pay | Admitting: Internal Medicine

## 2018-01-16 ENCOUNTER — Encounter (HOSPITAL_COMMUNITY): Payer: Medicare Other | Admitting: Physical Therapy

## 2018-01-16 ENCOUNTER — Ambulatory Visit (HOSPITAL_COMMUNITY): Payer: Medicare Other

## 2018-01-16 ENCOUNTER — Encounter (HOSPITAL_COMMUNITY): Payer: Self-pay

## 2018-01-16 DIAGNOSIS — M6281 Muscle weakness (generalized): Secondary | ICD-10-CM | POA: Diagnosis not present

## 2018-01-16 DIAGNOSIS — M25551 Pain in right hip: Secondary | ICD-10-CM | POA: Diagnosis not present

## 2018-01-16 DIAGNOSIS — R29898 Other symptoms and signs involving the musculoskeletal system: Secondary | ICD-10-CM | POA: Diagnosis not present

## 2018-01-16 NOTE — Telephone Encounter (Signed)
01/16/18  I spoke to patient and asked her if we could move the 10:30 appt on 2/22 to 2:30 and she said that was fine.

## 2018-01-16 NOTE — Therapy (Signed)
[place], [place], [address] Phone: [phone]   Fax:  [phone]  Physical Therapy Treatment  Patient Details  Name: Miranda [name] MRN: [id number] Date of Birth: Nov 01, 1934 Referring Provider: Arther Abbott, MD   Encounter Date: 01/16/2018  PT End of Session - 01/16/18 0908    Visit Number  7    Number of Visits  9    Date for PT Re-Evaluation  01/23/18    Authorization Type  Medicare Part A and B    Authorization Time Period  12/26/17 to 01/23/18    PT Start Time  0905    PT Stop Time  0943    PT Time Calculation (min)  38 min    Activity Tolerance  Patient tolerated treatment well    Behavior During Therapy  [facility] for tasks assessed/performed       Past Medical History:  Diagnosis Date  . Asthma   . Cancer ([place])   . Essential hypertension, benign   . Mixed hyperlipidemia   . Nephrolithiasis     Past Surgical History:  Procedure Laterality Date  . ABDOMINAL HYSTERECTOMY     excessive bleeding, no cancer   . Left knee arthroscopy due to torn ligament and cartiallege  2006   [facility]   . Left lung - lobectomy lower lobe - lung cancer      There were no vitals filed for this visit.  Subjective Assessment - 01/16/18 0906    Subjective  Pt stated she had new/different pain like it wanted to burn in Rt hip last night, current pain scale 5/10.     Patient Stated Goals  get rid of this pain    Currently in Pain?  Yes    Pain Score  5     Pain Location  Hip    Pain Orientation  Right    Pain Descriptors / Indicators  Aching    Pain Type  Chronic pain    Pain Onset  More than a month ago    Pain Frequency  Intermittent    Aggravating Factors   worse at night, being on it    Pain Relieving Factors  medicine, rest                      [facility] Adult PT Treatment/Exercise - 01/16/18 0001      Knee/Hip Exercises: Standing   Forward Lunges  15 reps;Both 6 in step    Lateral Step Up   Right;10 reps;Hand Hold: 1;Step Height: 4"    Forward Step Up  Right;15 reps;Hand Hold: 1;Step Height: 4"    Functional Squat  15 reps front of chair for mechanics    SLS with Vectors  2x 5" BLE    Other Standing Knee Exercises  sidestepping with GTB 8ft x2RT; postural strengthening RTB (scapular retraction, rows, shoulder extension) 10x each    Other Standing Knee Exercises  tandem stance on foam 3x 30"; 2sets with palvo RTB 15x each      Manual Therapy   Manual Therapy  Myofascial release    Manual therapy comments  completed separate rest of treatment    Myofascial Release  in sidelying; MFR to R glute med, glute max               PT Short Term Goals - 12/26/17 0947      PT SHORT TERM GOAL #1   Title  Pt will be independent with  HEP and perform consistently in order to maximize overall function and decrease pain.    Time  2    Period  Weeks    Status  New    Target Date  01/09/18      PT SHORT TERM GOAL #2   Title  Pt will have improved MMT by at least 1/2 grade or > of all muscle groups tested in order to decrease pain and maximize gait to allow pt to complete IADLs with greater ease.    Time  2    Period  Weeks    Status  New        PT Long Term Goals - 12/26/17 1021      PT LONG TERM GOAL #1   Title  Pt will be able to perform 3MWT without exacerbation in R thigh pain and with min to no gait deviations in order to demo improved functional strength and pain.    Time  4    Period  Weeks    Status  New    Target Date  01/23/18      PT LONG TERM GOAL #2   Title  Pt will be able to perform bil SLS for 30 sec or > without compensations and without evidence of Trendelenberg sign to demo improved functional glute med strength in order to improve gait mechanics and functional tasks.    Time  4    Period  Weeks    Status  New      PT LONG TERM GOAL #3   Title  Pt will have -Ely's test of BLE and report no recreation of anterior this pain when testing the RLE to  demo improved quad and hip flexor flexibility and decrease pt's overall c/o pain.    Time  4    Period  Weeks    Status  New      PT LONG TERM GOAL #4   Title  Pt will report 2/10 RLE pain at night or < to allow her to use the restroom and return to sleep at night with greater ease.     Time  4    Period  Weeks    Status  New            Plan - 01/16/18 6644    Clinical Impression Statement  Progressed functional strengthening with additional stair training and hip stability wtih balance activities.  Pt contin7ues to require verbal and tactile cueing to improve posture through session.  EOS with manual to address soft tissue restricitns to Rt gluteal region.  No reports of pain through session.      Rehab Potential  Good    PT Frequency  2x / week    PT Duration  4 weeks    PT Treatment/Interventions  ADLs/Self Care Home Management;Cryotherapy;Electrical Stimulation;Moist Heat;Ultrasound;DME Instruction;Gait training;Stair training;Functional mobility training;Therapeutic activities;Therapeutic exercise;Balance training;Neuromuscular re-education;Patient/family education;Manual techniques;Passive range of motion;Dry needling;Energy conservation;Taping    PT Next Visit Plan  Reassess next session (or 2).  Continues functional strenghtening, core and postural strengthenig and balance training wiht manual for gluteal region.    PT Home Exercise Plan  eval: SKTC, prone quad stretch; 2/1: piriformis stretch, iso clams with belt; 2/6: bridging; 01/11/18: sidestep with theraband       Patient will benefit from skilled therapeutic intervention in order to improve the following deficits and impairments:  Abnormal gait, Decreased activity tolerance, Decreased mobility, Decreased range of motion, Decreased strength, Difficulty walking, Hypomobility, Increased fascial restricitons,  Increased muscle spasms, Impaired flexibility, Improper body mechanics, Postural dysfunction, Pain  Visit  Diagnosis: Pain in right hip  Muscle weakness (generalized)  Other symptoms and signs involving the musculoskeletal system     Problem List Patient Active Problem List   Diagnosis Date Noted  . PSVT (paroxysmal supraventricular tachycardia) ([place]) 01/05/2012  . Near syncope 12/05/2011  . Palpitations 12/05/2011  . Abnormal ECG 12/05/2011  . HYPERLIPIDEMIA 11/10/2006  . Essential hypertension, benign 11/10/2006   Ihor Austin, [place]; [facility] [phone]' Aldona Lento 01/16/2018, 9:46 AM  [place], [place], [address] Phone: [phone]   Fax:  [phone]  Name: Miranda [name] MRN: [id number] Date of Birth: August 29, 1934

## 2018-01-18 ENCOUNTER — Encounter (HOSPITAL_COMMUNITY): Payer: Medicare Other | Admitting: Physical Therapy

## 2018-01-18 ENCOUNTER — Ambulatory Visit (HOSPITAL_COMMUNITY): Payer: Medicare Other

## 2018-01-18 DIAGNOSIS — M6281 Muscle weakness (generalized): Secondary | ICD-10-CM

## 2018-01-18 DIAGNOSIS — M25551 Pain in right hip: Secondary | ICD-10-CM | POA: Diagnosis not present

## 2018-01-18 DIAGNOSIS — R29898 Other symptoms and signs involving the musculoskeletal system: Secondary | ICD-10-CM

## 2018-01-18 NOTE — Therapy (Signed)
Miranda [name], [place], [address] Phone: [phone]   Fax:  [phone]  Physical Therapy Reassessment/Treatment  Patient Details  Name: Miranda [name] MRN: [id number] Date of Birth: Sep 06, 1934 Referring Provider: Arther Abbott    Encounter Date: 01/18/2018  PT End of Session - 01/18/18 1551    Visit Number  8    Number of Visits  12    Date for PT Re-Evaluation  02/01/18    Authorization Type  Medicare Part A and B    Authorization Time Period  12/26/17 to 01/23/18; 2/25-3/10    PT Start Time  1437    PT Stop Time  1533    PT Time Calculation (min)  56 min    Activity Tolerance  Patient tolerated treatment well;No increased pain;Patient limited by fatigue    Behavior During Therapy  [facility] for tasks assessed/performed       Past Medical History:  Diagnosis Date  . Asthma   . Cancer ([place])   . Essential hypertension, benign   . Mixed hyperlipidemia   . Nephrolithiasis     Past Surgical History:  Procedure Laterality Date  . ABDOMINAL HYSTERECTOMY     excessive bleeding, no cancer   . Left knee arthroscopy due to torn ligament and cartiallege  2006   [facility]   . Left lung - lobectomy lower lobe - lung cancer      There were no vitals filed for this visit.  Subjective Assessment - 01/18/18 1439    Subjective  Pt arrives for reassessment visit. She reports significan timprovement overall, Pt reports she has more time within the week where she is pain-free.     How long can you sit comfortably?  no issues    How long can you stand comfortably?  no issues    How long can you walk comfortably?  unlimited    Currently in Pain?  No/denies         [facility] PT Assessment - 01/18/18 0001      Assessment   Medical Diagnosis  Parin in R leg, Lumbar Spondylosis, Spondylolisthesis of lumbar region, Arthritis R knee    Referring Provider  Arther Abbott     Onset Date/Surgical Date  -- Thanksgiving 2018    Next MD Visit  February 04, 2018     Prior Therapy  none      Balance Screen   Has the patient fallen in the past 6 months  Yes    How many times?  1 1 fall while puttign up [employer], tripped, none since star    Has the patient had a decrease in activity level because of a fear of falling?   No    Is the patient reluctant to leave their home because of a fear of falling?   No      Prior Function   Level of Independence  Independent    Vocation  Retired    Leisure  play senior games (bowl, etc.), crafting, play cards      AROM   AROM Assessment Site  Lumbar    Lumbar Flexion  Lumbar curvature approaches neutral in funcitonal flexion slight maintained lordosis T9-L2      Strength   Strength Assessment Site  Hip    Right/Left Hip  Right;Left    Right Hip Flexion  5/5 was 4+/5    Right Hip Extension  4-/5 prone SLR (was 4-/5)     Right Hip External  Rotation   5/5    Right Hip Internal Rotation  4-/5 glute med pain with posterolateral referral pain    Right Hip ABduction  4/5 (was 4-/5); horizontal abdct: 5/5    Right Hip ADduction  5/5    Left Hip Flexion  5/5    Left Hip Extension  5/5 prone SLR    Left Hip External Rotation  5/5    Left Hip Internal Rotation  5/5    Left Hip ABduction  4+/5 (was 4/5); horizontal abdct: 5/5    Left Hip ADduction  5/5    Right Knee Flexion  5/5    Right Knee Extension  5/5    Left Knee Flexion  5/5    Left Knee Extension  5/5    Right Ankle Dorsiflexion  5/5    Left Ankle Dorsiflexion  5/5      Static Standing Balance   Static Standing - Comment/# of Minutes  Rt: 33 sec (increased Sx pain); (30sec on 1/30) Lt SLS: 49sec (13 sec on 1/30)                   [facility] Adult PT Treatment/Exercise - 01/18/18 0001      Ambulation/Gait   Ambulation Distance (Feet)  670 Feet 698 at eval    Gait Pattern  Step-through pattern;Trendelenburg;Antalgic;Lateral trunk lean to right Right trendelenburg from start, then Rt comp T-burg at 60sec    Gait  velocity  1.3ms    Gait Comments  Pt reports some posterolateral tightness, bu tno pain.  (5/10 thigh pain at eval)       Knee/Hip Exercises: Standing   Hip Abduction  AROM;Stengthening;Right;1 set;10 reps;Knee straight VC to aim for "4:30" posterolateral target, VC for toe-in/    Abduction Limitations  toe out increases as glute min fatigues    SLS  SLS hip hiking on 2" step:  Pt lacks strength to perform 1 rep; deferred at this time    Other Standing Knee Exercises  sidestepping with BTB [id number]  VC for twice the cadence, half the step length    Other Standing Knee Exercises  semi-tandem stance without toeout: 1x15sec bilat (added to HEP)       Knee/Hip Exercises: Sidelying   Other Sidelying Knee/Hip Exercises  Left sidelying, Right reverse clam with bolster: AA/ROM for full ROM 1x10 (added to HEP)        HEP updates/Additions:            PT Short Term Goals - 01/18/18 1457      PT SHORT TERM GOAL #1   Title  Pt will be independent with HEP and perform consistently in order to maximize overall function and decrease pain.    Time  2    Period  Weeks    Status  Achieved    Target Date  01/09/18      PT SHORT TERM GOAL #2   Title  Pt will have improved MMT by at least 1/2 grade or > of all muscle groups tested in order to decrease pain and maximize gait to allow pt to complete IADLs with greater ease.    Baseline  All previosuly tested areas are now 5/5. Isolated glute med weakness and pain in abduction and IR remain weak and/or painful.     Time  2    Period  Weeks    Status  Partially Met        PT Long Term Goals - 01/18/18 1459  PT LONG TERM GOAL #1   Title  Pt will be able to perform 3MWT without exacerbation in R thigh pain and with min to no gait deviations in order to demo improved functional strength and pain.    Time  4    Period  Weeks    Status  New      PT LONG TERM GOAL #2   Title  Pt will be able to perform bil SLS for 30 sec or > without  compensations and without evidence of Trendelenberg sign to demo improved functional glute med strength in order to improve gait mechanics and functional tasks.    Baseline  still not pain free on right; Left ankle strategy dominant    Time  4    Period  Weeks    Status  New      PT LONG TERM GOAL #3   Title  Pt will have -Ely's test of BLE and report no recreation of anterior this pain when testing the RLE to demo improved quad and hip flexor flexibility and decrease pt's overall c/o pain.    Baseline  MMT of hip extension performed in prone pain free    Time  4    Period  Weeks    Status  Achieved      PT LONG TERM GOAL #4   Title  Pt will report 2/10 RLE pain at night or < to allow her to use the restroom and return to sleep at night with greater ease.     Baseline  Pt pain free nocturnally 4 of 7 nights.     Time  4    Period  Weeks    Status  New      PT LONG TERM GOAL #5   Title  In 6 weeks patient will demonstrate 5/5 hip abduction bilat, and Rt hip Internal rotation 4/5 that is pain free.     Time  6    Period  Weeks    Status  New    Target Date  02/03/18      Additional Long Term Goals   Additional Long Term Goals  Yes      PT LONG TERM GOAL #6   Title  In 6 weeks patient will demonstrate ability to perform correctly standing hip hikinh exercise to 5 reps with correct form.     Baseline  unable to perform 1x on 2/22     Time  6    Period  Weeks    Status  New    Target Date  02/03/18      Pain referral patterns from Gluteus minimus       Plan - 01/18/18 1600    Clinical Impression Statement  Reassessment done this visit. Pt reports significant improvement in functional and resting pain. Pt demonstrating significant improvement in isolated strength testing with exception to hip abduction, with only mild improvements. Isolated deficits remain in Right hip, glute medius and minimus, identified in testing of hip abduction, right hip internal rotation, noted with  gait deficits in ambulation, (+) external derotation test, and pain referral pattern consistent with glute minimus. HEP is updated this session to better target with improved isolated the posterior glute medius/minimus. These exercises are difficult to perform correctly, but patient is offered visual, verbal, and tactile feedback in teaching them, and additional FU will take place in future sessions. Pt has insufficient strength to perform full ROM hip abduction in closed kinetic chain when hip hiking is  attempted, hence this will be added in later once strength is improved. Pt agreeable to add 4 more visits before FU with ortho, to fine tune HEP, improve HEP specificity, and prepare for independent DC. Excellent demonstrated improvement toward  completion of all goals. Gait deficits remain significant, predictive of future pain and recurrent injury if strength is not improved.     Clinical Presentation  Stable    Clinical Presentation due to:  objectively improving fucntion with remaining deficits.     Clinical Decision Making  Moderate    Rehab Potential  Good    PT Frequency  2x / week    PT Duration  2 weeks    PT Treatment/Interventions  ADLs/Self Care Home Management;Cryotherapy;Electrical Stimulation;Moist Heat;Ultrasound;DME Instruction;Gait training;Stair training;Functional mobility training;Therapeutic activities;Therapeutic exercise;Balance training;Neuromuscular re-education;Patient/family education;Manual techniques;Passive range of motion;Dry needling;Energy conservation;Taping    PT Next Visit Plan  Spend time reviewing recent HEP updates that are more specific to posterior portion of glute med and glute min as they are very difficulty to perform correctly; maintain focus on isolated strengthing of hip abduction bilat adn Right hip internal rotators.     PT Home Exercise Plan  eval: SKTC, prone quad stretch; 2/1: piriformis stretch, iso clams with belt; 2/6: bridging; 01/11/18: sidestep with  theraband; At reassessment: semi tandem balance, side stepping with blue band, reverse clamshell, standing hip ABDCT    Consulted and Agree with Plan of Care  Patient       Patient will benefit from skilled therapeutic intervention in order to improve the following deficits and impairments:  Abnormal gait, Decreased activity tolerance, Decreased mobility, Decreased range of motion, Decreased strength, Difficulty walking, Hypomobility, Increased fascial restricitons, Increased muscle spasms, Impaired flexibility, Improper body mechanics, Postural dysfunction, Pain  Visit Diagnosis: Pain in right hip  Muscle weakness (generalized)  Other symptoms and signs involving the musculoskeletal system     Problem List Patient Active Problem List   Diagnosis Date Noted  . PSVT (paroxysmal supraventricular tachycardia) ([place]) 01/05/2012  . Near syncope 12/05/2011  . Palpitations 12/05/2011  . Abnormal ECG 12/05/2011  . HYPERLIPIDEMIA 11/10/2006  . Essential hypertension, benign 11/10/2006   4:14 PM, 01/18/18 Etta Grandchild, PT, DPT  Physical Therapist at [place] [phone] (office)      Etta Grandchild 01/18/2018, [facility] [address] [place], [place], [address] Phone: [phone]   Fax:  [phone]  Name: Miranda [name] MRN: [id number] Date of Birth: 16-Jul-1934

## 2018-01-21 ENCOUNTER — Encounter (HOSPITAL_COMMUNITY): Payer: Medicare Other

## 2018-01-22 ENCOUNTER — Ambulatory Visit (HOSPITAL_COMMUNITY): Payer: Medicare Other | Admitting: Physical Therapy

## 2018-01-22 DIAGNOSIS — M6281 Muscle weakness (generalized): Secondary | ICD-10-CM

## 2018-01-22 DIAGNOSIS — M25551 Pain in right hip: Secondary | ICD-10-CM

## 2018-01-22 DIAGNOSIS — R29898 Other symptoms and signs involving the musculoskeletal system: Secondary | ICD-10-CM

## 2018-01-22 NOTE — Therapy (Signed)
[facility], [place], [address] Phone: [phone]   Fax:  [phone]  Physical Therapy Treatment  Patient Details  Name: Miranda [name] MRN: [id number] Date of Birth: May 14, 1934 Referring Provider: Arther Abbott    Encounter Date: 01/22/2018  PT End of Session - 01/22/18 1509    Visit Number  9    Number of Visits  12    Date for PT Re-Evaluation  02/01/18    Authorization Type  Medicare Part A and B    Authorization Time Period  12/26/17 to 01/23/18; 2/25-3/10    PT Start Time  1435    PT Stop Time  1508    PT Time Calculation (min)  33 min    Activity Tolerance  Patient tolerated treatment well;No increased pain;Patient limited by fatigue    Behavior During Therapy  [facility] for tasks assessed/performed       Past Medical History:  Diagnosis Date  . Asthma   . Cancer ([place])   . Essential hypertension, benign   . Mixed hyperlipidemia   . Nephrolithiasis     Past Surgical History:  Procedure Laterality Date  . ABDOMINAL HYSTERECTOMY     excessive bleeding, no cancer   . Left knee arthroscopy due to torn ligament and cartiallege  2006   [facility]   . Left lung - lobectomy lower lobe - lung cancer      There were no vitals filed for this visit.  Subjective Assessment - 01/22/18 1444    Subjective  Pt states her Rt hip still hurts at 4/10 today.  States up until Sunday was feeling pretty good and then began hurting again after that.  Pt reports no known cause for increased pain.    Currently in Pain?  Yes    Pain Score  4     Pain Location  Hip    Pain Orientation  Right    Pain Descriptors / Indicators  Aching    Pain Type  Chronic pain                      [facility] Adult PT Treatment/Exercise - 01/22/18 0001      Knee/Hip Exercises: Stretches   Piriformis Stretch  Both;2 reps;30 seconds;Limitations    Piriformis Stretch Limitations  supine with leg crossed and overpressure       Knee/Hip Exercises: Supine   Bridges  Both;2 sets;15 reps      Knee/Hip Exercises: Sidelying   Hip ABduction  15 reps;2 sets    Other Sidelying Knee/Hip Exercises  Left sidelying, Right reverse clam with bolster 2 sets 10 reps      Manual Therapy   Manual Therapy  Myofascial release    Manual therapy comments  completed separate rest of treatment    Myofascial Release  in sidelying; MFR to R glute med, glute max               PT Short Term Goals - 01/18/18 1457      PT SHORT TERM GOAL #1   Title  Pt will be independent with HEP and perform consistently in order to maximize overall function and decrease pain.    Time  2    Period  Weeks    Status  Achieved    Target Date  01/09/18      PT SHORT TERM GOAL #2   Title  Pt will have improved MMT by at least 1/2 grade or >  of all muscle groups tested in order to decrease pain and maximize gait to allow pt to complete IADLs with greater ease.    Baseline  All previosuly tested areas are now 5/5. Isolated glute med weakness and pain in abduction and IR remain weak and/or painful.     Time  2    Period  Weeks    Status  Partially Met        PT Long Term Goals - 01/18/18 1459      PT LONG TERM GOAL #1   Title  Pt will be able to perform 3MWT without exacerbation in R thigh pain and with min to no gait deviations in order to demo improved functional strength and pain.    Time  4    Period  Weeks    Status  New      PT LONG TERM GOAL #2   Title  Pt will be able to perform bil SLS for 30 sec or > without compensations and without evidence of Trendelenberg sign to demo improved functional glute med strength in order to improve gait mechanics and functional tasks.    Baseline  still not pain free on right; Left ankle strategy dominant    Time  4    Period  Weeks    Status  New      PT LONG TERM GOAL #3   Title  Pt will have -Ely's test of BLE and report no recreation of anterior this pain when testing the RLE to demo  improved quad and hip flexor flexibility and decrease pt's overall c/o pain.    Baseline  MMT of hip extension performed in prone pain free    Time  4    Period  Weeks    Status  Achieved      PT LONG TERM GOAL #4   Title  Pt will report 2/10 RLE pain at night or < to allow her to use the restroom and return to sleep at night with greater ease.     Baseline  Pt pain free nocturnally 4 of 7 nights.     Time  4    Period  Weeks    Status  New      PT LONG TERM GOAL #5   Title  In 6 weeks patient will demonstrate 5/5 hip abduction bilat, and Rt hip Internal rotation 4/5 that is pain free.     Time  6    Period  Weeks    Status  New    Target Date  02/03/18      Additional Long Term Goals   Additional Long Term Goals  Yes      PT LONG TERM GOAL #6   Title  In 6 weeks patient will demonstrate ability to perform correctly standing hip hikinh exercise to 5 reps with correct form.     Baseline  unable to perform 1x on 2/22     Time  6    Period  Weeks    Status  New    Target Date  02/03/18            Plan - 01/22/18 1509    Clinical Impression Statement  Pt wtih increased pain today with unknown cause.  HEld all standing exercises and completed mat activities and review of newly added exercises last session.  INstructed with supine piriformis stretch with good stretch obtained and noted tightness in Rt piriformis.  Resumed manual to Rt glute/piriformis to help  reduce tightness/pain in this area with good results.  PT with stated reduction in pain at end of session.    Rehab Potential  Good    PT Frequency  2x / week    PT Duration  2 weeks    PT Treatment/Interventions  ADLs/Self Care Home Management;Cryotherapy;Electrical Stimulation;Moist Heat;Ultrasound;DME Instruction;Gait training;Stair training;Functional mobility training;Therapeutic activities;Therapeutic exercise;Balance training;Neuromuscular re-education;Patient/family education;Manual techniques;Passive range of  motion;Dry needling;Energy conservation;Taping    PT Next Visit Plan  Continue to improve strength of hip abductors bilaterally and Right hip internal rotators.  Resume standing stab exercises if symptoms/ pain reduced next session.     PT Home Exercise Plan  eval: SKTC, prone quad stretch; 2/1: piriformis stretch, iso clams with belt; 2/6: bridging; 01/11/18: sidestep with theraband; At reassessment: semi tandem balance, side stepping with blue band, reverse clamshell, standing hip ABDCT    Consulted and Agree with Plan of Care  Patient       Patient will benefit from skilled therapeutic intervention in order to improve the following deficits and impairments:  Abnormal gait, Decreased activity tolerance, Decreased mobility, Decreased range of motion, Decreased strength, Difficulty walking, Hypomobility, Increased fascial restricitons, Increased muscle spasms, Impaired flexibility, Improper body mechanics, Postural dysfunction, Pain  Visit Diagnosis: Pain in right hip  Muscle weakness (generalized)  Other symptoms and signs involving the musculoskeletal system     Problem List Patient Active Problem List   Diagnosis Date Noted  . PSVT (paroxysmal supraventricular tachycardia) ([place]) 01/05/2012  . Near syncope 12/05/2011  . Palpitations 12/05/2011  . Abnormal ECG 12/05/2011  . HYPERLIPIDEMIA 11/10/2006  . Essential hypertension, benign 11/10/2006   Teena Irani, PTA/CLT [phone]  Teena Irani 01/22/2018, 3:16 PM  [facility] [address] [place], [place], [address] Phone: [phone]   Fax:  [phone]  Name: Miranda [name] MRN: [id number] Date of Birth: Sep 07, 1934

## 2018-01-23 ENCOUNTER — Encounter (HOSPITAL_COMMUNITY): Payer: Medicare Other

## 2018-01-24 ENCOUNTER — Ambulatory Visit (HOSPITAL_COMMUNITY): Payer: Medicare Other | Admitting: Physical Therapy

## 2018-01-24 DIAGNOSIS — M6281 Muscle weakness (generalized): Secondary | ICD-10-CM

## 2018-01-24 DIAGNOSIS — R29898 Other symptoms and signs involving the musculoskeletal system: Secondary | ICD-10-CM

## 2018-01-24 DIAGNOSIS — M25551 Pain in right hip: Secondary | ICD-10-CM

## 2018-01-24 NOTE — Therapy (Signed)
[place], [place], [address] Phone: [phone]   Fax:  [phone]  Physical Therapy Treatment  Patient Details  Name: Miranda [name] MRN: [id number] Date of Birth: [birth year] Referring Provider: Arther Abbott    Encounter Date: 01/24/2018  PT End of Session - 01/24/18 1702    Visit Number  10    Number of Visits  12    Date for PT Re-Evaluation  02/01/18    Authorization Type  Medicare Part A and B    Authorization Time Period  12/26/17 to 01/23/18; 2/25-3/10    PT Start Time  1350    PT Stop Time  1430    PT Time Calculation (min)  40 min    Activity Tolerance  Patient tolerated treatment well;No increased pain;Patient limited by fatigue    Behavior During Therapy  [facility] for tasks assessed/performed       Past Medical History:  Diagnosis Date  . Asthma   . Cancer ([place])   . Essential hypertension, benign   . Mixed hyperlipidemia   . Nephrolithiasis     Past Surgical History:  Procedure Laterality Date  . ABDOMINAL HYSTERECTOMY     excessive bleeding, no cancer   . Left knee arthroscopy due to torn ligament and cartiallege  2006   [facility]   . Left lung - lobectomy lower lobe - lung cancer      There were no vitals filed for this visit.  Subjective Assessment - 01/24/18 1354    Subjective  PT states her pain is no worse but no better.  STates weight bearing actually decreases pain and would like to resume standing exercises.      Currently in Pain?  Yes    Pain Score  4     Pain Location  Hip    Pain Orientation  Right    Pain Descriptors / Indicators  Aching    Pain Type  Chronic pain                      [facility] Adult PT Treatment/Exercise - 01/24/18 0001      Knee/Hip Exercises: Standing   Forward Lunges  15 reps;Both;Limitations    Forward Lunges Limitations  onto 4" step without UE assist    Hip Abduction  AROM;Stengthening;Right;1 set;10 reps;Knee straight     Abduction Limitations  postural cues and keeping toe into neutral    Hip Extension  AROM;Both;10 reps    Extension Limitations  postural cues    Lateral Step Up  Right;Hand Hold: 1;Step Height: 4";15 reps    Forward Step Up  Right;15 reps;Hand Hold: 1;Step Height: 6"    SLS  SLS hip hiking on 2" step:  Pt lacks strength to perform 1 rep; deferred at this time    SLS with Vectors  10X 5" BLE    Other Standing Knee Exercises  tandem stance on foam 30" X 2 each      Manual Therapy   Manual Therapy  Myofascial release    Manual therapy comments  completed separate rest of treatment    Myofascial Release  in sidelying; MFR to R glute med, glute max               PT Short Term Goals - 01/18/18 1457      PT SHORT TERM GOAL #1   Title  Pt will be independent with HEP and perform consistently in order to maximize overall  function and decrease pain.    Time  2    Period  Weeks    Status  Achieved    Target Date  01/09/18      PT SHORT TERM GOAL #2   Title  Pt will have improved MMT by at least 1/2 grade or > of all muscle groups tested in order to decrease pain and maximize gait to allow pt to complete IADLs with greater ease.    Baseline  All previosuly tested areas are now 5/5. Isolated glute med weakness and pain in abduction and IR remain weak and/or painful.     Time  2    Period  Weeks    Status  Partially Met        PT Long Term Goals - 01/18/18 1459      PT LONG TERM GOAL #1   Title  Pt will be able to perform 3MWT without exacerbation in R thigh pain and with min to no gait deviations in order to demo improved functional strength and pain.    Time  4    Period  Weeks    Status  New      PT LONG TERM GOAL #2   Title  Pt will be able to perform bil SLS for 30 sec or > without compensations and without evidence of Trendelenberg sign to demo improved functional glute med strength in order to improve gait mechanics and functional tasks.    Baseline  still not pain free  on right; Left ankle strategy dominant    Time  4    Period  Weeks    Status  New      PT LONG TERM GOAL #3   Title  Pt will have -Ely's test of BLE and report no recreation of anterior this pain when testing the RLE to demo improved quad and hip flexor flexibility and decrease pt's overall c/o pain.    Baseline  MMT of hip extension performed in prone pain free    Time  4    Period  Weeks    Status  Achieved      PT LONG TERM GOAL #4   Title  Pt will report 2/10 RLE pain at night or < to allow her to use the restroom and return to sleep at night with greater ease.     Baseline  Pt pain free nocturnally 4 of 7 nights.     Time  4    Period  Weeks    Status  New      PT LONG TERM GOAL #5   Title  In 6 weeks patient will demonstrate 5/5 hip abduction bilat, and Rt hip Internal rotation 4/5 that is pain free.     Time  6    Period  Weeks    Status  New    Target Date  02/03/18      Additional Long Term Goals   Additional Long Term Goals  Yes      PT LONG TERM GOAL #6   Title  In 6 weeks patient will demonstrate ability to perform correctly standing hip hikinh exercise to 5 reps with correct form.     Baseline  unable to perform 1x on 2/22     Time  6    Period  Weeks    Status  New    Target Date  02/03/18            Plan - 01/24/18 1704  Clinical Impression Statement  Resumed standing exercises today with cues for form but no pain verbalized while completing therex and no rest breaks needed.  Pt with most difficulty completing hip abduction in correct form without toe veering out with fatigue.  Manual completed at end of session to Rt glute/piriformis in LT sidelying.  Instructed with self trigger point massage using tennis ball (supplied pt with tennis ball) both in supine and seated position.  Pt with reduced symptoms following manual and therex.     Rehab Potential  Good    PT Frequency  2x / week    PT Duration  2 weeks    PT Treatment/Interventions  ADLs/Self  Care Home Management;Cryotherapy;Electrical Stimulation;Moist Heat;Ultrasound;DME Instruction;Gait training;Stair training;Functional mobility training;Therapeutic activities;Therapeutic exercise;Balance training;Neuromuscular re-education;Patient/family education;Manual techniques;Passive range of motion;Dry needling;Energy conservation;Taping    PT Next Visit Plan  Continue to improve strength of hip abductors bilaterally and Right hip internal rotators.  Progress reps/challenge as able.     PT Home Exercise Plan  eval: SKTC, prone quad stretch; 2/1: piriformis stretch, iso clams with belt; 2/6: bridging; 01/11/18: sidestep with theraband; At reassessment: semi tandem balance, side stepping with blue band, reverse clamshell, standing hip ABDCT    Consulted and Agree with Plan of Care  Patient       Patient will benefit from skilled therapeutic intervention in order to improve the following deficits and impairments:  Abnormal gait, Decreased activity tolerance, Decreased mobility, Decreased range of motion, Decreased strength, Difficulty walking, Hypomobility, Increased fascial restricitons, Increased muscle spasms, Impaired flexibility, Improper body mechanics, Postural dysfunction, Pain  Visit Diagnosis: Pain in right hip  Muscle weakness (generalized)  Other symptoms and signs involving the musculoskeletal system     Problem List Patient Active Problem List   Diagnosis Date Noted  . PSVT (paroxysmal supraventricular tachycardia) ([place]) 01/05/2012  . Near syncope 12/05/2011  . Palpitations 12/05/2011  . Abnormal ECG 12/05/2011  . HYPERLIPIDEMIA 11/10/2006  . Essential hypertension, benign 11/10/2006   Teena Irani, PTA/CLT [phone]  Teena Irani 01/24/2018, 5:09 PM  [facility] [address] [place], [place], [address] Phone: [phone]   Fax:  [phone]  Name: Miranda [name] MRN: [id number] Date of Birth: [birth year]

## 2018-01-25 ENCOUNTER — Encounter (HOSPITAL_COMMUNITY): Payer: Medicare Other | Admitting: Physical Therapy

## 2018-01-28 ENCOUNTER — Other Ambulatory Visit: Payer: Self-pay

## 2018-01-28 ENCOUNTER — Encounter (HOSPITAL_COMMUNITY): Payer: Self-pay | Admitting: Physical Therapy

## 2018-01-28 ENCOUNTER — Encounter (HOSPITAL_COMMUNITY): Payer: Medicare Other

## 2018-01-28 ENCOUNTER — Ambulatory Visit (HOSPITAL_COMMUNITY): Payer: Medicare Other | Attending: Internal Medicine | Admitting: Physical Therapy

## 2018-01-28 DIAGNOSIS — M25551 Pain in right hip: Secondary | ICD-10-CM | POA: Insufficient documentation

## 2018-01-28 DIAGNOSIS — M6281 Muscle weakness (generalized): Secondary | ICD-10-CM | POA: Diagnosis not present

## 2018-01-28 DIAGNOSIS — R29898 Other symptoms and signs involving the musculoskeletal system: Secondary | ICD-10-CM | POA: Insufficient documentation

## 2018-01-28 NOTE — Patient Instructions (Addendum)
Piriformis Stretch (All-Fours)    With right leg crossed in front, slide other leg back, lowering hips until stretch is felt. Hold 60 seconds Repeat _1___ times per set. Do _1__ sets per session. Do _1___ sessions per day.  http://orth.exer.us/292   Copyright  VHI. All rights reserved.  Angry Cat Stretch    Tuck chin and tighten stomach, arching back. Repeat ___5_ times per set. Do ___1_ sets per session. Do 2____ sessions per day.  http://orth.exer.us/118   Copyright  VHI. All rights reserved.

## 2018-01-28 NOTE — Therapy (Addendum)
[place], [place], [address] Phone: [phone]   Fax:  [phone]  Physical Therapy Treatment  Patient Details  Name: Miranda [name] MRN: [id number] Date of Birth: 05-22-1934 Referring Provider: Arther Abbott    Encounter Date: 01/28/2018  PT End of Session - 01/28/18 1111    Visit Number  11    Number of Visits  12    Date for PT Re-Evaluation  02/01/18    Authorization Type  Medicare Part A and B    Authorization Time Period  12/26/17 to 01/23/18; 2/25-3/10    Authorization - Visit Number  11    Authorization - Number of Visits  12    PT Start Time  3762    PT Stop Time  1115    PT Time Calculation (min)  40 min    Activity Tolerance  Patient tolerated treatment well;No increased pain;Patient limited by fatigue    Behavior During Therapy  [facility] for tasks assessed/performed       Past Medical History:  Diagnosis Date  . Asthma   . Cancer ([place])   . Essential hypertension, benign   . Mixed hyperlipidemia   . Nephrolithiasis     Past Surgical History:  Procedure Laterality Date  . ABDOMINAL HYSTERECTOMY     excessive bleeding, no cancer   . Left knee arthroscopy due to torn ligament and cartiallege  2006   [facility]   . Left lung - lobectomy lower lobe - lung cancer      There were no vitals filed for this visit.  Subjective Assessment - 01/28/18 1040    Subjective  PT states that she is having a little nagging pain in her hip that is constant     How long can you sit comfortably?  no issues    How long can you stand comfortably?  no issues    How long can you walk comfortably?  unlimited    Currently in Pain?  Yes    Pain Score  3     Pain Location  Hip    Pain Orientation  Right    Pain Descriptors / Indicators  Aching    Pain Type  Chronic pain    Pain Radiating Towards  to knee     Pain Onset  More than a month ago    Pain Frequency  Intermittent    Aggravating Factors   not sure     Pain Relieving Factors  knee to chest          [facility] PT Assessment - 01/28/18 0001      Assessment   Medical Diagnosis  Parin in R leg, Lumbar Spondylosis, Spondylolisthesis of lumbar region, Arthritis R knee    Referring Provider  Arther Abbott     Onset Date/Surgical Date  -- Thanksgiving 2018    Next MD Visit  February 04, 2018     Prior Therapy  none      Balance Screen   Has the patient fallen in the past 6 months  Yes    How many times?  1 putting up x-mas decoration     Has the patient had a decrease in activity level because of a fear of falling?   No    Is the patient reluctant to leave their home because of a fear of falling?   No      Prior Function   Level of Independence  Independent    Vocation  Retired    Leisure  Estate manager/land agent games (bowl, Social research officer, government.), crafting, play [employer]      Functional Tests   Functional tests  Single leg stance;Sit to Stand      Single Leg Stance   Comments  B 60 seconds More difficult on Rt side.       Sit to Stand   Comments  5 x 18.91      Posture/Postural Control   Posture/Postural Control  Postural limitations    Postural Limitations  Rounded Shoulders;Forward head;Increased thoracic kyphosis;Increased lumbar lordosis      AROM   Lumbar Flexion  Lumbar curvature approaches neutral in funcitonal flexion slight maintained lordosis T9-L2    Lumbar Extension  excessive lordosis; has lumbar spondylosisthesis    Lumbar - Right Side Bend  WNL    Lumbar - Left Side Bend  WNL    Lumbar - Right Rotation  WNL    Lumbar - Left Rotation  WNL      Strength   Right Hip Flexion  5/5 was 4+/5    Right Hip Extension  5/5 prone SLR (was 4-/5)     Right Hip External Rotation   4/5;5/5    Right Hip Internal Rotation  4-/5 was 4-/5     Right Hip ABduction  4/5 (was 4-/5); horizontal abdct: 5/5    Right Hip ADduction  5/5    Left Hip Flexion  5/5    Left Hip Extension  5/5 prone SLR    Left Hip External  Rotation  5/5    Left Hip Internal Rotation  5/5    Left Hip ABduction  5/5 (was 4/5);     Left Hip ADduction  5/5    Right Knee Flexion  5/5    Right Knee Extension  5/5    Left Knee Flexion  5/5    Left Knee Extension  5/5    Right Ankle Dorsiflexion  5/5    Left Ankle Dorsiflexion  5/5      Flexibility   Soft Tissue Assessment /Muscle Length  yes    Hamstrings  WNL    Quadriceps  --    Piriformis  R tighter than L      Palpation   Palpation comment  increased tenderness and restrictions of R glutes (especially medius), piriformis, TFL, VLO, ITB      Ambulation/Gait   Ambulation Distance (Feet)  678 Feet 698 at eval    Gait Comments  Pt reports some posterolateral tightness, bu tno pain.  (5/10 thigh pain at eval)       Balance   Balance Assessed  Yes      Static Standing Balance   Static Standing - Balance Support  No upper extremity supported    Static Standing Balance -  Activities   Single Leg Stance - Right Leg;Single Leg Stance - Left Leg      Standardized Balance Assessment   Standardized Balance Assessment  Five Times Sit to Stand    Five times sit to stand comments   12sec, no UE                  [facility] Adult PT Treatment/Exercise - 01/28/18 0001      Ambulation/Gait   Assistive device none   Gait Pattern normal   Gait velocity  678      Exercises   Exercises  Lumbar  Lumbar Exercises: Stretches   Prone on Elbows Stretch; quadriped piriformis stretch   5 reps / 1 minute      Lumbar Exercises: Supine   Bridge  15 reps      Lumbar Exercises: Sidelying   Clam  10 reps      Lumbar Exercises: Prone   Straight Leg Raise  10 reps      Lumbar Exercises: Quadruped   Madcat/Old Horse  5 reps      Knee/Hip Exercises: Stretches   Other Knee/Hip Stretches  POE x 3'      Knee/Hip Exercises: Prone   Straight Leg Raises  Both; 10  reps               PT Short Term Goals - 01/28/18 1113      PT SHORT TERM GOAL #1   Title  Pt will  be independent with HEP and perform consistently in order to maximize overall function and decrease pain.    Time  2    Period  Weeks    Status  Achieved      PT SHORT TERM GOAL #2   Title  Pt will have improved MMT by at least 1/2 grade or > of all muscle groups tested in order to decrease pain and maximize gait to allow pt to complete IADLs with greater ease.    Baseline  All previosuly tested areas are now 5/5. Isolated glute med weakness and pain in abduction and IR remain weak and/or painful.     Time  2    Period  Weeks    Status  Partially Met        PT Long Term Goals - 01/28/18 1113      PT LONG TERM GOAL #1   Title  Pt will be able to perform 3MWT without exacerbation in R thigh pain and with min to no gait deviations in order to demo improved functional strength and pain.    Time  4    Period  Weeks    Status  New      PT LONG TERM GOAL #2   Title  Pt will be able to perform bil SLS for 30 sec or > without compensations and without evidence of Trendelenberg sign to demo improved functional glute med strength in order to improve gait mechanics and functional tasks.    Baseline  still not pain free on right; Left ankle strategy dominant    Time  4    Period  Weeks    Status  Achieved      PT LONG TERM GOAL #3   Title  Pt will have -Ely's test of BLE and report no recreation of anterior this pain when testing the RLE to demo improved quad and hip flexor flexibility and decrease pt's overall c/o pain.    Baseline  MMT of hip extension performed in prone pain free    Time  4    Period  Weeks    Status  Achieved      PT LONG TERM GOAL #4   Title  Pt will report 2/10 RLE pain at night or < to allow her to use the restroom and return to sleep at night with greater ease.     Baseline  Pt pain free nocturnally 4 of 7 nights.     Time  4    Period  Weeks    Status  Achieved      PT LONG TERM GOAL #5  Title  In 6 weeks patient will demonstrate 5/5 hip abduction bilat, and  Rt hip Internal rotation 4/5 that is pain free.     Time  6    Period  Weeks    Status  Achieved      PT LONG TERM GOAL #6   Title  In 6 weeks patient will demonstrate ability to perform correctly standing hip hikinh exercise to 5 reps with correct form.     Baseline  unable to perform 1x on 2/22     Time  6    Period  Weeks    Status  Achieved           PT reassessed with noted weakness in hip IR/ER as well as tight ER on right.  Pt is I in her HEP and feels that she will be ready for discharge next visit.   Plan - 01/28/18 1209    Rehab Potential  Good    PT Frequency  2x / week    PT Duration  2 weeks    PT Treatment/Interventions  ADLs/Self Care Home Management;Cryotherapy;Electrical Stimulation;Moist Heat;Ultrasound;DME Instruction;Gait training;Stair training;Functional mobility training;Therapeutic activities;Therapeutic exercise;Balance training;Neuromuscular re-education;Patient/family education;Manual techniques;Passive range of motion;Dry needling;Energy conservation;Taping    PT Next Visit Plan  Continue to improve strength of hip abductors bilaterally and Right hip internal rotators.  Progress reps/challenge as able.     PT Home Exercise Plan  eval: SKTC, prone quad stretch; 2/1: piriformis stretch, iso clams with belt; 2/6: bridging; 01/11/18: sidestep with theraband; At reassessment: semi tandem balance, side stepping with blue band, reverse clamshell, standing hip ABDCT ; 01/28/2018: mad cat/old horse/ piriformis stretch quadriped    Consulted and Agree with Plan of Care  Patient       Patient will benefit from skilled therapeutic intervention in order to improve the following deficits and impairments:  Abnormal gait, Decreased activity tolerance, Decreased mobility, Decreased range of motion, Decreased strength, Difficulty walking, Hypomobility, Increased fascial restricitons, Increased muscle spasms, Impaired flexibility, Improper body mechanics, Postural dysfunction,  Pain  Visit Diagnosis: Pain in right hip  Muscle weakness (generalized)     Problem List Patient Active Problem List   Diagnosis Date Noted  . PSVT (paroxysmal supraventricular tachycardia) ([place]) 01/05/2012  . Near syncope 12/05/2011  . Palpitations 12/05/2011  . Abnormal ECG 12/05/2011  . HYPERLIPIDEMIA 11/10/2006  . Essential hypertension, benign 11/10/2006    Rayetta Humphrey, PT CLT [phone] 01/28/2018, 12:18 PM  [facility] [address] [place], [place], [address] Phone: [phone]   Fax:  [phone]  Name: Miranda [name] MRN: [id number] Date of Birth: 02/29/1934

## 2018-01-29 ENCOUNTER — Encounter (HOSPITAL_COMMUNITY): Payer: Medicare Other

## 2018-01-30 ENCOUNTER — Ambulatory Visit (HOSPITAL_COMMUNITY): Payer: Medicare Other | Admitting: Physical Therapy

## 2018-01-30 ENCOUNTER — Encounter (HOSPITAL_COMMUNITY): Payer: Medicare Other

## 2018-01-30 DIAGNOSIS — M6281 Muscle weakness (generalized): Secondary | ICD-10-CM | POA: Diagnosis not present

## 2018-01-30 DIAGNOSIS — R29898 Other symptoms and signs involving the musculoskeletal system: Secondary | ICD-10-CM

## 2018-01-30 DIAGNOSIS — M25551 Pain in right hip: Secondary | ICD-10-CM

## 2018-01-30 NOTE — Patient Instructions (Signed)
Piriformis Stretch, Sitting    Sit, one ankle on opposite knee, same-side hand on crossed knee. Push down on knee, keeping spine straight. Lean torso forward, with flat back, until tension is felt in hamstrings and gluteals of crossed-leg side. Hold _30_ seconds.Repeat _2__ times per session. Do _2__ sessions per dayExternal Rotation: Hip - Knees Apart (Side-Lying)    Lie on left side with hips and knees slightly bent, band tied just above knees. Pull knees apart. Hold for _5_ seconds. Repeat _10__ times. Do _2__ times a day.  Hip Abduction: Side-Lying (Single Leg)    Lie on side with knees bent, tubing around thighs just above knees. Raise top leg, keeping knee bent.  Repeat _10_ times per set. Repeat on other side. Do _2_ sessions per day.

## 2018-01-30 NOTE — Therapy (Signed)
[place], [place], [address] Phone: [phone]   Fax:  [phone]  Physical Therapy Treatment/Discharge   Patient Details  Name: Miranda [name] MRN: [id number] Date of Birth: October 23, 1934 Referring Provider: Arther Abbott    Encounter Date: 01/30/2018  PT End of Session - 01/30/18 1504    Visit Number  12    Number of Visits  12    Date for PT Re-Evaluation  02/01/18    Authorization Type  Medicare Part A and B    Authorization Time Period  12/26/17 to 01/23/18; 2/25-3/10    Authorization - Visit Number  12    Authorization - Number of Visits  12    PT Start Time  1435    PT Stop Time  1500    PT Time Calculation (min)  25 min    Activity Tolerance  Patient tolerated treatment well;No increased pain;Patient limited by fatigue    Behavior During Therapy  [facility] for tasks assessed/performed       Past Medical History:  Diagnosis Date  . Asthma   . Cancer ([place])   . Essential hypertension, benign   . Mixed hyperlipidemia   . Nephrolithiasis     Past Surgical History:  Procedure Laterality Date  . ABDOMINAL HYSTERECTOMY     excessive bleeding, no cancer   . Left knee arthroscopy due to torn ligament and cartiallege  2006   [facility]   . Left lung - lobectomy lower lobe - lung cancer      There were no vitals filed for this visit.  Subjective Assessment - 01/30/18 1437    Subjective  PT states she still has tenderness in her Rt hip, nagging pain that has not changed.  States it stay between 2-3/10.  Continues to complete her exercises daily.    Currently in Pain?  Yes    Pain Score  3     Pain Location  Hip    Pain Orientation  Right    Pain Descriptors / Indicators  Aching        All below measurements were taken at 01/28/2018 visit by Azucena Freed, PT [facility] PT Assessment - 01/30/18 1500      Assessment   Medical Diagnosis  Parin in R leg, Lumbar Spondylosis, Spondylolisthesis of lumbar  region, Arthritis R knee    Onset Date/Surgical Date  -- Thanksgiving 2018    Next MD Visit  February 04, 2018     Prior Therapy  none      Prior Function   Level of Independence  Independent    Vocation  Retired    Leisure  play senior games (bowl, etc.), crafting, play cards      Sensation   Light Touch  Appears Intact      Functional Tests   Functional tests  Single leg stance;Sit to Stand      Single Leg Stance   Comments  B 60 seconds More difficult on Rt side.       Sit to Stand   Comments  5 x 18.91      Posture/Postural Control   Posture/Postural Control  Postural limitations    Postural Limitations  Rounded Shoulders;Forward head;Increased thoracic kyphosis;Increased lumbar lordosis      AROM   Lumbar Flexion  Lumbar curvature approaches neutral in funcitonal flexion slight maintained lordosis T9-L2    Lumbar Extension  excessive lordosis; has lumbar spondylosisthesis    Lumbar - Right Side Bend  WNL    Lumbar - Left Side Bend  WNL    Lumbar - Right Rotation  WNL    Lumbar - Left Rotation  WNL      Strength   Right Hip Flexion  5/5 was 4+/5    Right Hip Extension  5/5 prone SLR (was 4-/5)     Right Hip External Rotation   4/5;5/5    Right Hip Internal Rotation  4-/5 was 4-/5     Right Hip ABduction  4/5 (was 4-/5); horizontal abdct: 5/5    Right Hip ADduction  5/5    Left Hip Flexion  5/5    Left Hip Extension  5/5 prone SLR    Left Hip External Rotation  5/5    Left Hip Internal Rotation  5/5    Left Hip ABduction  5/5 (was 4/5);     Left Hip ADduction  5/5    Right Knee Flexion  5/5    Right Knee Extension  5/5    Left Knee Flexion  5/5    Left Knee Extension  5/5    Right Ankle Dorsiflexion  5/5    Left Ankle Dorsiflexion  5/5      Flexibility   Soft Tissue Assessment /Muscle Length  yes    Hamstrings  WNL    Piriformis  R tighter than L      Palpation   Palpation comment  increased tenderness and restrictions of R glutes (especially medius),  piriformis, TFL, VLO, ITB      Ambulation/Gait   Ambulation Distance (Feet)  678 Feet 698 at eval    Gait Comments  Pt reports some posterolateral tightness, bu tno pain.  (5/10 thigh pain at eval)       Balance   Balance Assessed  Yes      Static Standing Balance   Static Standing - Balance Support  No upper extremity supported    Static Standing Balance -  Activities   Single Leg Stance - Right Leg;Single Leg Stance - Left Leg      Standardized Balance Assessment   Standardized Balance Assessment  Five Times Sit to Stand    Five times sit to stand comments   12sec, no UE                  [facility] Adult PT Treatment/Exercise - 01/30/18 0001      Lumbar Exercises: Stretches   Piriformis Stretch  Right;Left;2 reps;30 seconds;Limitations    Piriformis Stretch Limitations  seated             PT Education - 01/30/18 1503    Education provided  Yes    Education Details  updated HEP to include hip IR/ER strengthening and piriformis stretch    Person(s) Educated  Patient    Methods  Explanation;Demonstration;Tactile cues;Verbal cues;Handout    Comprehension  Verbalized understanding;Returned demonstration       PT Short Term Goals - 01/28/18 1113      PT SHORT TERM GOAL #1   Title  Pt will be independent with HEP and perform consistently in order to maximize overall function and decrease pain.    Time  2    Period  Weeks    Status  Achieved      PT SHORT TERM GOAL #2   Title  Pt will have improved MMT by at least 1/2 grade or > of all muscle groups tested in order to decrease pain and maximize gait to allow pt to  complete IADLs with greater ease.    Baseline  All previosuly tested areas are now 5/5. Isolated glute med weakness and pain in abduction and IR remain weak and/or painful.     Time  2    Period  Weeks    Status  Partially Met        PT Long Term Goals - 01/28/18 1113      PT LONG TERM GOAL #1   Title  Pt will be able to perform 3MWT without  exacerbation in R thigh pain and with min to no gait deviations in order to demo improved functional strength and pain.    Time  4    Period  Weeks    Status  New      PT LONG TERM GOAL #2   Title  Pt will be able to perform bil SLS for 30 sec or > without compensations and without evidence of Trendelenberg sign to demo improved functional glute med strength in order to improve gait mechanics and functional tasks.    Baseline  still not pain free on right; Left ankle strategy dominant    Time  4    Period  Weeks    Status  Achieved      PT LONG TERM GOAL #3   Title  Pt will have -Ely's test of BLE and report no recreation of anterior this pain when testing the RLE to demo improved quad and hip flexor flexibility and decrease pt's overall c/o pain.    Baseline  MMT of hip extension performed in prone pain free    Time  4    Period  Weeks    Status  Achieved      PT LONG TERM GOAL #4   Title  Pt will report 2/10 RLE pain at night or < to allow her to use the restroom and return to sleep at night with greater ease.     Baseline  Pt pain free nocturnally 4 of 7 nights.     Time  4    Period  Weeks    Status  Achieved      PT LONG TERM GOAL #5   Title  In 6 weeks patient will demonstrate 5/5 hip abduction bilat, and Rt hip Internal rotation 4/5 that is pain free.     Time  6    Period  Weeks    Status  Achieved      PT LONG TERM GOAL #6   Title  In 6 weeks patient will demonstrate ability to perform correctly standing hip hikinh exercise to 5 reps with correct form.     Baseline  unable to perform 1x on 2/22     Time  6    Period  Weeks    Status  Achieved            Plan - 01/30/18 1515    Clinical Impression Statement  Above measurements taken by evaluating therapist last session (01/28/18) wtih remaining deficits outlined.  Per PT plan of care, Issued new exercises to help strengthen hip IR/ER and piriformis stretch.  Pt able to complete these in correct form with most  weakness displayed in IR.  Pt wtihout questions or concerns and verbalizes agreement with discharge from therapy.      Rehab Potential  Good    PT Frequency  2x / week    PT Duration  2 weeks    PT Treatment/Interventions  ADLs/Self Care Home Management;Cryotherapy;Electrical Stimulation;Moist Heat;Ultrasound;DME  Instruction;Gait training;Stair training;Functional mobility training;Therapeutic activities;Therapeutic exercise;Balance training;Neuromuscular re-education;Patient/family education;Manual techniques;Passive range of motion;Dry needling;Energy conservation;Taping    PT Next Visit Plan  discharge per PT POC on 01/28/18 visit.     PT Home Exercise Plan  eval: SKTC, prone quad stretch; 2/1: piriformis stretch, iso clams with belt; 2/6: bridging; 01/11/18: sidestep with theraband; At reassessment: semi tandem balance, side stepping with blue band, reverse clamshell, standing hip ABDCT    Consulted and Agree with Plan of Care  Patient       Patient will benefit from skilled therapeutic intervention in order to improve the following deficits and impairments:  Abnormal gait, Decreased activity tolerance, Decreased mobility, Decreased range of motion, Decreased strength, Difficulty walking, Hypomobility, Increased fascial restricitons, Increased muscle spasms, Impaired flexibility, Improper body mechanics, Postural dysfunction, Pain  Visit Diagnosis: Pain in right hip  Muscle weakness (generalized)  Other symptoms and signs involving the musculoskeletal system     Problem List Patient Active Problem List   Diagnosis Date Noted  . PSVT (paroxysmal supraventricular tachycardia) ([place]) 01/05/2012  . Near syncope 12/05/2011  . Palpitations 12/05/2011  . Abnormal ECG 12/05/2011  . HYPERLIPIDEMIA 11/10/2006  . Essential hypertension, benign 11/10/2006   Teena Irani, PTA/CLT [phone] 01/30/2018, 3:18 PM Rayetta Humphrey, PT CLT [place] [address] [place], [place], [address] Phone: [phone]   Fax:  [phone]  Name: Miranda [name] MRN: [id number] Date of Birth: 1934-08-24  PHYSICAL THERAPY DISCHARGE SUMMARY  Visits from Start of Care: 12  Current functional level related to goals / functional outcomes: See above   Remaining deficits: See above   Education / Equipment: HEP Plan: Patient agrees to discharge.  Patient goals were met. Patient is being discharged due to meeting the stated rehab goals.  ?????        Rayetta Humphrey, [place] CLT [phone]

## 2018-02-01 ENCOUNTER — Ambulatory Visit (HOSPITAL_COMMUNITY): Payer: Medicare Other | Admitting: Physical Therapy

## 2018-02-03 DIAGNOSIS — M4316 Spondylolisthesis, lumbar region: Secondary | ICD-10-CM | POA: Insufficient documentation

## 2018-02-03 DIAGNOSIS — M47816 Spondylosis without myelopathy or radiculopathy, lumbar region: Secondary | ICD-10-CM | POA: Insufficient documentation

## 2018-02-04 ENCOUNTER — Ambulatory Visit (INDEPENDENT_AMBULATORY_CARE_PROVIDER_SITE_OTHER): Payer: Medicare Other | Admitting: Orthopedic Surgery

## 2018-02-04 ENCOUNTER — Encounter: Payer: Self-pay | Admitting: Orthopedic Surgery

## 2018-02-04 VITALS — BP 148/82 | HR 66 | Ht 60.0 in | Wt 164.0 lb

## 2018-02-04 DIAGNOSIS — M4316 Spondylolisthesis, lumbar region: Secondary | ICD-10-CM

## 2018-02-04 DIAGNOSIS — M47816 Spondylosis without myelopathy or radiculopathy, lumbar region: Secondary | ICD-10-CM

## 2018-02-04 NOTE — Progress Notes (Signed)
Progress Note   Patient ID: Miranda [name], female   DOB: Oct 07, 1934, 84 y.o.   MRN: [id number]  Chief Complaint  Patient presents with  . Hip Pain    right lateral hip pain  better with therapy     HPI   Miranda [name] is 84 years old she presented for evaluation right leg and hip  She complained of pain in the right hip radiating to the right knee which is increased at night associated with giving way of the right knee and swelling of the right knee.  She has been treated with 1 injection IM, prednisone Dosepak and a muscle relaxer.  She continues to complain of dull moderate aching right hip pain localized to the right buttock radiates to the right knee which is been present now for several months not improving.   After physical therapy the patient does report improvement in overall pain.  PT notes indicate:   PT Next Visit Plan  discharge per PT POC on 01/28/18 visit.     PT Home Exercise Plan  eval: SKTC, prone quad stretch; 2/1: piriformis stretch, iso clams with belt; 2/6: bridging; 01/11/18: sidestep with theraband; At reassessment: semi tandem balance, side stepping with blue band, reverse clamshell, standing hip ABDCT    Consulted and Agree with Plan of Care  Patient    ROS Current Meds  Medication Sig  . Amlodipine-Valsartan-HCTZ (EXFORGE HCT) 5-160-25 MG TABS Take 1 tablet by mouth daily.  Marland Kitchen aspirin (ASPIRIN LOW DOSE) 81 MG EC tablet Take 81 mg by mouth daily.    Marland Kitchen atorvastatin (LIPITOR) 10 MG tablet Take 10 mg by mouth daily.  . brimonidine (ALPHAGAN) 0.2 % ophthalmic solution Place 1 drop into the right eye 2 (two) times daily.  Marland Kitchen BROMSITE 0.075 % SOLN Place 1 drop into the left eye 2 (two) times daily.  . fluocinonide cream (LIDEX) 1.02 % Apply 1 application topically 2 (two) times daily. Twice a day for 2 weeks, then off 1 week. Repeat once  . ibuprofen (ADVIL,MOTRIN) 800 MG tablet Take 1 tablet (800 mg total) by mouth every 8 (eight) hours as needed.  Marland Kitchen KLOR-CON M20  20 MEQ tablet Take 1 tablet by mouth daily. For 1 week then as needed there after  . latanoprost (XALATAN) 0.005 % ophthalmic solution Place 1 drop into both eyes at bedtime.  . methocarbamol (ROBAXIN) 500 MG tablet Take 1 tablet (500 mg total) by mouth at bedtime.  . metoprolol succinate (TOPROL-XL) 25 MG 24 hr tablet   . tamsulosin (FLOMAX) 0.4 MG CAPS capsule Take 1 capsule (0.4 mg total) by mouth daily.  Marland Kitchen telmisartan-hydrochlorothiazide (MICARDIS HCT) 80-25 MG tablet Take 1 tablet by mouth daily.  Marland Kitchen torsemide (DEMADEX) 20 MG tablet TAKE 1 TO 2 TABLETS BY MOUTH DAILY FOR 7 DAYS, THEN AS NEEDED    Allergies  Allergen Reactions  . Bee Venom Swelling     BP (!) 148/82   Pulse 66   Ht 5' (1.524 m)   Wt 164 lb (74.4 kg)   BMI 32.03 kg/m   Physical Exam  Constitutional: She is oriented to person, place, and time. She appears well-developed and well-nourished.  Musculoskeletal:       Right hip: She exhibits normal range of motion, normal strength, no tenderness, no bony tenderness, no swelling, no crepitus and no deformity.       Back:  Neurological: She is alert and oriented to person, place, and time.  Psychiatric: She has a normal mood and  affect. Judgment normal.  Vitals reviewed.    Medical decision-making Encounter Diagnoses  Name Primary?  . Spondylolisthesis of lumbar region Yes  . Lumbar spondylosis     Improvement with therapy continue physical therapy at home use ibuprofen and Robaxin for pain follow-up in 3 months     Arther Abbott, MD 02/04/2018 8:42 AM

## 2018-02-07 DIAGNOSIS — H2013 Chronic iridocyclitis, bilateral: Secondary | ICD-10-CM | POA: Diagnosis not present

## 2018-02-07 DIAGNOSIS — H40053 Ocular hypertension, bilateral: Secondary | ICD-10-CM | POA: Diagnosis not present

## 2018-02-07 DIAGNOSIS — H402231 Chronic angle-closure glaucoma, bilateral, mild stage: Secondary | ICD-10-CM | POA: Diagnosis not present

## 2018-02-07 DIAGNOSIS — H04123 Dry eye syndrome of bilateral lacrimal glands: Secondary | ICD-10-CM | POA: Diagnosis not present

## 2018-02-28 ENCOUNTER — Other Ambulatory Visit: Payer: Self-pay | Admitting: Orthopedic Surgery

## 2018-02-28 DIAGNOSIS — M79604 Pain in right leg: Secondary | ICD-10-CM

## 2018-02-28 DIAGNOSIS — M4316 Spondylolisthesis, lumbar region: Secondary | ICD-10-CM

## 2018-02-28 DIAGNOSIS — M47816 Spondylosis without myelopathy or radiculopathy, lumbar region: Secondary | ICD-10-CM

## 2018-02-28 DIAGNOSIS — M1711 Unilateral primary osteoarthritis, right knee: Secondary | ICD-10-CM

## 2018-03-20 DIAGNOSIS — M5137 Other intervertebral disc degeneration, lumbosacral region: Secondary | ICD-10-CM | POA: Diagnosis not present

## 2018-03-20 DIAGNOSIS — M9905 Segmental and somatic dysfunction of pelvic region: Secondary | ICD-10-CM | POA: Diagnosis not present

## 2018-03-21 ENCOUNTER — Other Ambulatory Visit (HOSPITAL_COMMUNITY): Payer: Self-pay | Admitting: Internal Medicine

## 2018-03-21 DIAGNOSIS — Z1231 Encounter for screening mammogram for malignant neoplasm of breast: Secondary | ICD-10-CM

## 2018-03-22 ENCOUNTER — Encounter (HOSPITAL_COMMUNITY): Payer: Self-pay

## 2018-03-22 ENCOUNTER — Ambulatory Visit (HOSPITAL_COMMUNITY)
Admission: RE | Admit: 2018-03-22 | Discharge: 2018-03-22 | Disposition: A | Discharge status: Home / Self Care | Payer: Medicare Other | Source: Ambulatory Visit | Attending: Internal Medicine | Admitting: Internal Medicine

## 2018-03-22 DIAGNOSIS — Z1231 Encounter for screening mammogram for malignant neoplasm of breast: Secondary | ICD-10-CM | POA: Diagnosis not present

## 2018-03-25 ENCOUNTER — Ambulatory Visit (HOSPITAL_COMMUNITY): Payer: Medicare Other

## 2018-03-25 DIAGNOSIS — M9905 Segmental and somatic dysfunction of pelvic region: Secondary | ICD-10-CM | POA: Diagnosis not present

## 2018-03-25 DIAGNOSIS — M5137 Other intervertebral disc degeneration, lumbosacral region: Secondary | ICD-10-CM | POA: Diagnosis not present

## 2018-03-27 DIAGNOSIS — M9905 Segmental and somatic dysfunction of pelvic region: Secondary | ICD-10-CM | POA: Diagnosis not present

## 2018-03-27 DIAGNOSIS — M5137 Other intervertebral disc degeneration, lumbosacral region: Secondary | ICD-10-CM | POA: Diagnosis not present

## 2018-03-28 DIAGNOSIS — M9905 Segmental and somatic dysfunction of pelvic region: Secondary | ICD-10-CM | POA: Diagnosis not present

## 2018-03-28 DIAGNOSIS — M5137 Other intervertebral disc degeneration, lumbosacral region: Secondary | ICD-10-CM | POA: Diagnosis not present

## 2018-04-01 DIAGNOSIS — M9905 Segmental and somatic dysfunction of pelvic region: Secondary | ICD-10-CM | POA: Diagnosis not present

## 2018-04-01 DIAGNOSIS — M5137 Other intervertebral disc degeneration, lumbosacral region: Secondary | ICD-10-CM | POA: Diagnosis not present

## 2018-04-03 DIAGNOSIS — M9905 Segmental and somatic dysfunction of pelvic region: Secondary | ICD-10-CM | POA: Diagnosis not present

## 2018-04-03 DIAGNOSIS — M5137 Other intervertebral disc degeneration, lumbosacral region: Secondary | ICD-10-CM | POA: Diagnosis not present

## 2018-04-04 DIAGNOSIS — M5137 Other intervertebral disc degeneration, lumbosacral region: Secondary | ICD-10-CM | POA: Diagnosis not present

## 2018-04-04 DIAGNOSIS — M9905 Segmental and somatic dysfunction of pelvic region: Secondary | ICD-10-CM | POA: Diagnosis not present

## 2018-04-08 DIAGNOSIS — M5137 Other intervertebral disc degeneration, lumbosacral region: Secondary | ICD-10-CM | POA: Diagnosis not present

## 2018-04-08 DIAGNOSIS — M9905 Segmental and somatic dysfunction of pelvic region: Secondary | ICD-10-CM | POA: Diagnosis not present

## 2018-04-10 DIAGNOSIS — M5137 Other intervertebral disc degeneration, lumbosacral region: Secondary | ICD-10-CM | POA: Diagnosis not present

## 2018-04-10 DIAGNOSIS — M9905 Segmental and somatic dysfunction of pelvic region: Secondary | ICD-10-CM | POA: Diagnosis not present

## 2018-04-29 ENCOUNTER — Other Ambulatory Visit: Payer: Self-pay | Admitting: Orthopedic Surgery

## 2018-04-29 DIAGNOSIS — M47816 Spondylosis without myelopathy or radiculopathy, lumbar region: Secondary | ICD-10-CM

## 2018-04-29 DIAGNOSIS — M79604 Pain in right leg: Secondary | ICD-10-CM

## 2018-04-29 DIAGNOSIS — M4316 Spondylolisthesis, lumbar region: Secondary | ICD-10-CM

## 2018-04-29 DIAGNOSIS — M1711 Unilateral primary osteoarthritis, right knee: Secondary | ICD-10-CM

## 2018-05-06 ENCOUNTER — Encounter: Payer: Self-pay | Admitting: Orthopedic Surgery

## 2018-05-06 ENCOUNTER — Ambulatory Visit (INDEPENDENT_AMBULATORY_CARE_PROVIDER_SITE_OTHER): Payer: Medicare Other | Admitting: Orthopedic Surgery

## 2018-05-06 VITALS — BP 129/65 | HR 46 | Ht 60.0 in | Wt 165.0 lb

## 2018-05-06 DIAGNOSIS — M4316 Spondylolisthesis, lumbar region: Secondary | ICD-10-CM | POA: Diagnosis not present

## 2018-05-06 DIAGNOSIS — M47816 Spondylosis without myelopathy or radiculopathy, lumbar region: Secondary | ICD-10-CM

## 2018-05-06 NOTE — Progress Notes (Signed)
Chief Complaint  Patient presents with  . Back Pain    back feels much better     84 year old female for follow-up status post physical therapy for degenerative disc disease spondylolisthesis lumbar spine.  We treated her with Robaxin and ibuprofen as well as a physical therapy she presents back with decreased symptoms except for some mild residual discomfort over the right hip primarily at night.  She started chiropractic treatment and then has been successful as well    Review of Systems  Neurological: Negative for tingling, sensory change and weakness.   BP 129/65   Pulse (!) 46   Ht 5' (1.524 m)   Wt 165 lb (74.8 kg)   BMI 32.22 kg/m   Encounter Diagnoses  Name Primary?  . Spondylolisthesis of lumbar region Yes  . Lumbar spondylosis     Recommend continue chiropractic treatment, continue ibuprofen and Robaxin as needed  Follow-up as needed.

## 2018-05-10 DIAGNOSIS — H40053 Ocular hypertension, bilateral: Secondary | ICD-10-CM | POA: Diagnosis not present

## 2018-05-10 DIAGNOSIS — H402231 Chronic angle-closure glaucoma, bilateral, mild stage: Secondary | ICD-10-CM | POA: Diagnosis not present

## 2018-05-10 DIAGNOSIS — H2013 Chronic iridocyclitis, bilateral: Secondary | ICD-10-CM | POA: Diagnosis not present

## 2018-05-10 DIAGNOSIS — H04123 Dry eye syndrome of bilateral lacrimal glands: Secondary | ICD-10-CM | POA: Diagnosis not present

## 2018-07-08 DIAGNOSIS — R944 Abnormal results of kidney function studies: Secondary | ICD-10-CM | POA: Diagnosis not present

## 2018-07-08 DIAGNOSIS — I10 Essential (primary) hypertension: Secondary | ICD-10-CM | POA: Diagnosis not present

## 2018-07-08 DIAGNOSIS — M79604 Pain in right leg: Secondary | ICD-10-CM | POA: Diagnosis not present

## 2018-07-08 DIAGNOSIS — H4010X0 Unspecified open-angle glaucoma, stage unspecified: Secondary | ICD-10-CM | POA: Diagnosis not present

## 2018-07-08 DIAGNOSIS — Z0001 Encounter for general adult medical examination with abnormal findings: Secondary | ICD-10-CM | POA: Diagnosis not present

## 2018-07-08 DIAGNOSIS — M545 Low back pain: Secondary | ICD-10-CM | POA: Diagnosis not present

## 2018-07-08 DIAGNOSIS — R7303 Prediabetes: Secondary | ICD-10-CM | POA: Diagnosis not present

## 2018-07-08 DIAGNOSIS — R7301 Impaired fasting glucose: Secondary | ICD-10-CM | POA: Diagnosis not present

## 2018-07-08 DIAGNOSIS — Z23 Encounter for immunization: Secondary | ICD-10-CM | POA: Diagnosis not present

## 2018-07-08 DIAGNOSIS — E782 Mixed hyperlipidemia: Secondary | ICD-10-CM | POA: Diagnosis not present

## 2018-07-08 DIAGNOSIS — Z6831 Body mass index (BMI) 31.0-31.9, adult: Secondary | ICD-10-CM | POA: Diagnosis not present

## 2018-07-08 DIAGNOSIS — E876 Hypokalemia: Secondary | ICD-10-CM | POA: Diagnosis not present

## 2018-07-08 DIAGNOSIS — Q821 Xeroderma pigmentosum: Secondary | ICD-10-CM | POA: Diagnosis not present

## 2018-07-08 DIAGNOSIS — N183 Chronic kidney disease, stage 3 (moderate): Secondary | ICD-10-CM | POA: Diagnosis not present

## 2018-08-25 ENCOUNTER — Other Ambulatory Visit: Payer: Self-pay | Admitting: Orthopedic Surgery

## 2018-08-25 DIAGNOSIS — M79604 Pain in right leg: Secondary | ICD-10-CM

## 2018-08-25 DIAGNOSIS — M1711 Unilateral primary osteoarthritis, right knee: Secondary | ICD-10-CM

## 2018-08-25 DIAGNOSIS — M47816 Spondylosis without myelopathy or radiculopathy, lumbar region: Secondary | ICD-10-CM

## 2018-08-25 DIAGNOSIS — M4316 Spondylolisthesis, lumbar region: Secondary | ICD-10-CM

## 2018-09-11 DIAGNOSIS — H40053 Ocular hypertension, bilateral: Secondary | ICD-10-CM | POA: Diagnosis not present

## 2018-09-11 DIAGNOSIS — H04123 Dry eye syndrome of bilateral lacrimal glands: Secondary | ICD-10-CM | POA: Diagnosis not present

## 2018-09-11 DIAGNOSIS — H2013 Chronic iridocyclitis, bilateral: Secondary | ICD-10-CM | POA: Diagnosis not present

## 2018-09-11 DIAGNOSIS — H402231 Chronic angle-closure glaucoma, bilateral, mild stage: Secondary | ICD-10-CM | POA: Diagnosis not present

## 2018-09-17 DIAGNOSIS — R944 Abnormal results of kidney function studies: Secondary | ICD-10-CM | POA: Diagnosis not present

## 2018-09-17 DIAGNOSIS — E782 Mixed hyperlipidemia: Secondary | ICD-10-CM | POA: Diagnosis not present

## 2018-09-17 DIAGNOSIS — M545 Low back pain: Secondary | ICD-10-CM | POA: Diagnosis not present

## 2018-09-17 DIAGNOSIS — Z6831 Body mass index (BMI) 31.0-31.9, adult: Secondary | ICD-10-CM | POA: Diagnosis not present

## 2018-09-17 DIAGNOSIS — E876 Hypokalemia: Secondary | ICD-10-CM | POA: Diagnosis not present

## 2018-09-17 DIAGNOSIS — N183 Chronic kidney disease, stage 3 (moderate): Secondary | ICD-10-CM | POA: Diagnosis not present

## 2018-09-17 DIAGNOSIS — R7301 Impaired fasting glucose: Secondary | ICD-10-CM | POA: Diagnosis not present

## 2018-09-17 DIAGNOSIS — R7303 Prediabetes: Secondary | ICD-10-CM | POA: Diagnosis not present

## 2018-09-17 DIAGNOSIS — I10 Essential (primary) hypertension: Secondary | ICD-10-CM | POA: Diagnosis not present

## 2018-09-17 DIAGNOSIS — Q821 Xeroderma pigmentosum: Secondary | ICD-10-CM | POA: Diagnosis not present

## 2018-09-17 DIAGNOSIS — Z23 Encounter for immunization: Secondary | ICD-10-CM | POA: Diagnosis not present

## 2018-09-17 DIAGNOSIS — H4010X0 Unspecified open-angle glaucoma, stage unspecified: Secondary | ICD-10-CM | POA: Diagnosis not present

## 2018-09-17 DIAGNOSIS — I129 Hypertensive chronic kidney disease with stage 1 through stage 4 chronic kidney disease, or unspecified chronic kidney disease: Secondary | ICD-10-CM | POA: Diagnosis not present

## 2018-09-17 DIAGNOSIS — M79604 Pain in right leg: Secondary | ICD-10-CM | POA: Diagnosis not present

## 2018-10-28 ENCOUNTER — Other Ambulatory Visit: Payer: Self-pay | Admitting: Orthopedic Surgery

## 2018-10-28 DIAGNOSIS — M1711 Unilateral primary osteoarthritis, right knee: Secondary | ICD-10-CM

## 2018-10-28 DIAGNOSIS — H2013 Chronic iridocyclitis, bilateral: Secondary | ICD-10-CM | POA: Diagnosis not present

## 2018-10-28 DIAGNOSIS — M47816 Spondylosis without myelopathy or radiculopathy, lumbar region: Secondary | ICD-10-CM

## 2018-10-28 DIAGNOSIS — M79604 Pain in right leg: Secondary | ICD-10-CM

## 2018-10-28 DIAGNOSIS — H40053 Ocular hypertension, bilateral: Secondary | ICD-10-CM | POA: Diagnosis not present

## 2018-10-28 DIAGNOSIS — H402231 Chronic angle-closure glaucoma, bilateral, mild stage: Secondary | ICD-10-CM | POA: Diagnosis not present

## 2018-10-28 DIAGNOSIS — M4316 Spondylolisthesis, lumbar region: Secondary | ICD-10-CM

## 2018-10-28 DIAGNOSIS — H04123 Dry eye syndrome of bilateral lacrimal glands: Secondary | ICD-10-CM | POA: Diagnosis not present

## 2018-11-22 DIAGNOSIS — R944 Abnormal results of kidney function studies: Secondary | ICD-10-CM | POA: Diagnosis not present

## 2018-11-22 DIAGNOSIS — E782 Mixed hyperlipidemia: Secondary | ICD-10-CM | POA: Diagnosis not present

## 2018-11-22 DIAGNOSIS — R7301 Impaired fasting glucose: Secondary | ICD-10-CM | POA: Diagnosis not present

## 2018-11-22 DIAGNOSIS — R7303 Prediabetes: Secondary | ICD-10-CM | POA: Diagnosis not present

## 2018-11-22 DIAGNOSIS — I10 Essential (primary) hypertension: Secondary | ICD-10-CM | POA: Diagnosis not present

## 2018-11-22 DIAGNOSIS — N183 Chronic kidney disease, stage 3 (moderate): Secondary | ICD-10-CM | POA: Diagnosis not present

## 2018-11-25 DIAGNOSIS — E876 Hypokalemia: Secondary | ICD-10-CM | POA: Diagnosis not present

## 2018-11-25 DIAGNOSIS — E782 Mixed hyperlipidemia: Secondary | ICD-10-CM | POA: Diagnosis not present

## 2018-11-25 DIAGNOSIS — N182 Chronic kidney disease, stage 2 (mild): Secondary | ICD-10-CM | POA: Diagnosis not present

## 2018-11-25 DIAGNOSIS — R7301 Impaired fasting glucose: Secondary | ICD-10-CM | POA: Diagnosis not present

## 2018-11-25 DIAGNOSIS — Z0000 Encounter for general adult medical examination without abnormal findings: Secondary | ICD-10-CM | POA: Diagnosis not present

## 2018-11-25 DIAGNOSIS — Z6832 Body mass index (BMI) 32.0-32.9, adult: Secondary | ICD-10-CM | POA: Diagnosis not present

## 2018-11-25 DIAGNOSIS — I10 Essential (primary) hypertension: Secondary | ICD-10-CM | POA: Diagnosis not present

## 2018-11-25 DIAGNOSIS — E669 Obesity, unspecified: Secondary | ICD-10-CM | POA: Diagnosis not present

## 2018-11-25 DIAGNOSIS — R002 Palpitations: Secondary | ICD-10-CM | POA: Diagnosis not present

## 2018-11-25 DIAGNOSIS — Z85118 Personal history of other malignant neoplasm of bronchus and lung: Secondary | ICD-10-CM | POA: Diagnosis not present

## 2018-11-27 DIAGNOSIS — C50919 Malignant neoplasm of unspecified site of unspecified female breast: Secondary | ICD-10-CM

## 2018-11-27 HISTORY — PX: BREAST LUMPECTOMY: SHX2

## 2018-11-27 HISTORY — DX: Malignant neoplasm of unspecified site of unspecified female breast: C50.919

## 2018-11-29 ENCOUNTER — Ambulatory Visit (HOSPITAL_COMMUNITY)
Admission: RE | Admit: 2018-11-29 | Discharge: 2018-11-29 | Disposition: A | Discharge status: Home / Self Care | Payer: Medicare Other | Source: Ambulatory Visit | Attending: Adult Health Nurse Practitioner | Admitting: Adult Health Nurse Practitioner

## 2018-11-29 ENCOUNTER — Other Ambulatory Visit (HOSPITAL_COMMUNITY): Payer: Self-pay | Admitting: Adult Health Nurse Practitioner

## 2018-11-29 DIAGNOSIS — Z85118 Personal history of other malignant neoplasm of bronchus and lung: Secondary | ICD-10-CM | POA: Diagnosis not present

## 2018-11-29 DIAGNOSIS — R05 Cough: Secondary | ICD-10-CM | POA: Diagnosis not present

## 2019-02-05 DIAGNOSIS — L80 Vitiligo: Secondary | ICD-10-CM | POA: Diagnosis not present

## 2019-04-28 ENCOUNTER — Other Ambulatory Visit (HOSPITAL_COMMUNITY): Payer: Self-pay | Admitting: Internal Medicine

## 2019-04-28 DIAGNOSIS — Z1231 Encounter for screening mammogram for malignant neoplasm of breast: Secondary | ICD-10-CM

## 2019-05-12 ENCOUNTER — Other Ambulatory Visit: Payer: Self-pay

## 2019-05-12 ENCOUNTER — Ambulatory Visit (HOSPITAL_COMMUNITY)
Admission: RE | Admit: 2019-05-12 | Discharge: 2019-05-12 | Disposition: A | Discharge status: Home / Self Care | Payer: Medicare Other | Source: Ambulatory Visit | Attending: Internal Medicine | Admitting: Internal Medicine

## 2019-05-12 DIAGNOSIS — Z1231 Encounter for screening mammogram for malignant neoplasm of breast: Secondary | ICD-10-CM | POA: Diagnosis not present

## 2019-05-14 ENCOUNTER — Other Ambulatory Visit (HOSPITAL_COMMUNITY): Payer: Self-pay | Admitting: Internal Medicine

## 2019-05-14 DIAGNOSIS — R928 Other abnormal and inconclusive findings on diagnostic imaging of breast: Secondary | ICD-10-CM

## 2019-05-15 DIAGNOSIS — H35013 Changes in retinal vascular appearance, bilateral: Secondary | ICD-10-CM | POA: Diagnosis not present

## 2019-05-15 DIAGNOSIS — H35033 Hypertensive retinopathy, bilateral: Secondary | ICD-10-CM | POA: Diagnosis not present

## 2019-05-15 DIAGNOSIS — H402231 Chronic angle-closure glaucoma, bilateral, mild stage: Secondary | ICD-10-CM | POA: Diagnosis not present

## 2019-05-15 DIAGNOSIS — H2640 Unspecified secondary cataract: Secondary | ICD-10-CM | POA: Diagnosis not present

## 2019-05-20 ENCOUNTER — Ambulatory Visit (HOSPITAL_COMMUNITY)
Admission: RE | Admit: 2019-05-20 | Discharge: 2019-05-20 | Disposition: A | Discharge status: Home / Self Care | Payer: Medicare Other | Source: Ambulatory Visit | Attending: Internal Medicine | Admitting: Internal Medicine

## 2019-05-20 ENCOUNTER — Other Ambulatory Visit (HOSPITAL_COMMUNITY): Payer: Self-pay | Admitting: Internal Medicine

## 2019-05-20 ENCOUNTER — Other Ambulatory Visit: Payer: Self-pay

## 2019-05-20 DIAGNOSIS — R928 Other abnormal and inconclusive findings on diagnostic imaging of breast: Secondary | ICD-10-CM | POA: Diagnosis not present

## 2019-05-20 DIAGNOSIS — N6310 Unspecified lump in the right breast, unspecified quadrant: Secondary | ICD-10-CM

## 2019-05-20 DIAGNOSIS — N6313 Unspecified lump in the right breast, lower outer quadrant: Secondary | ICD-10-CM | POA: Diagnosis not present

## 2019-05-27 ENCOUNTER — Other Ambulatory Visit: Payer: Self-pay

## 2019-05-27 ENCOUNTER — Other Ambulatory Visit (HOSPITAL_COMMUNITY): Payer: Medicare Other

## 2019-05-27 ENCOUNTER — Other Ambulatory Visit (HOSPITAL_COMMUNITY): Payer: Self-pay | Admitting: Internal Medicine

## 2019-05-27 ENCOUNTER — Encounter (HOSPITAL_COMMUNITY): Payer: Medicare Other

## 2019-05-27 ENCOUNTER — Ambulatory Visit (HOSPITAL_COMMUNITY)
Admission: RE | Admit: 2019-05-27 | Discharge: 2019-05-27 | Disposition: A | Discharge status: Home / Self Care | Payer: Medicare Other | Source: Ambulatory Visit | Attending: Internal Medicine | Admitting: Internal Medicine

## 2019-05-27 DIAGNOSIS — N6313 Unspecified lump in the right breast, lower outer quadrant: Secondary | ICD-10-CM | POA: Diagnosis not present

## 2019-05-27 DIAGNOSIS — R928 Other abnormal and inconclusive findings on diagnostic imaging of breast: Secondary | ICD-10-CM

## 2019-05-27 DIAGNOSIS — C50511 Malignant neoplasm of lower-outer quadrant of right female breast: Secondary | ICD-10-CM | POA: Insufficient documentation

## 2019-05-27 DIAGNOSIS — Z170 Estrogen receptor positive status [ER+]: Secondary | ICD-10-CM | POA: Diagnosis not present

## 2019-05-27 DIAGNOSIS — N6310 Unspecified lump in the right breast, unspecified quadrant: Secondary | ICD-10-CM | POA: Diagnosis present

## 2019-05-27 MED ORDER — LIDOCAINE HCL (PF) 1 % IJ SOLN
INTRAMUSCULAR | Status: AC
  Administered 2019-05-27: 13:00:00 2 mL
  Filled 2019-05-27: qty 5

## 2019-05-27 MED ORDER — LIDOCAINE-EPINEPHRINE (PF) 1 %-1:200000 IJ SOLN
INTRAMUSCULAR | Status: AC
  Administered 2019-05-27: 5 mL
  Filled 2019-05-27: qty 30

## 2019-05-29 DIAGNOSIS — E782 Mixed hyperlipidemia: Secondary | ICD-10-CM | POA: Diagnosis not present

## 2019-05-29 DIAGNOSIS — R7301 Impaired fasting glucose: Secondary | ICD-10-CM | POA: Diagnosis not present

## 2019-05-29 DIAGNOSIS — R7303 Prediabetes: Secondary | ICD-10-CM | POA: Diagnosis not present

## 2019-05-29 DIAGNOSIS — I10 Essential (primary) hypertension: Secondary | ICD-10-CM | POA: Diagnosis not present

## 2019-05-29 DIAGNOSIS — R944 Abnormal results of kidney function studies: Secondary | ICD-10-CM | POA: Diagnosis not present

## 2019-06-02 DIAGNOSIS — R7303 Prediabetes: Secondary | ICD-10-CM | POA: Diagnosis not present

## 2019-06-02 DIAGNOSIS — E782 Mixed hyperlipidemia: Secondary | ICD-10-CM | POA: Diagnosis not present

## 2019-06-02 DIAGNOSIS — Z85118 Personal history of other malignant neoplasm of bronchus and lung: Secondary | ICD-10-CM | POA: Diagnosis not present

## 2019-06-02 DIAGNOSIS — I129 Hypertensive chronic kidney disease with stage 1 through stage 4 chronic kidney disease, or unspecified chronic kidney disease: Secondary | ICD-10-CM | POA: Diagnosis not present

## 2019-06-02 DIAGNOSIS — N182 Chronic kidney disease, stage 2 (mild): Secondary | ICD-10-CM | POA: Diagnosis not present

## 2019-06-02 DIAGNOSIS — E669 Obesity, unspecified: Secondary | ICD-10-CM | POA: Diagnosis not present

## 2019-06-02 DIAGNOSIS — Z6831 Body mass index (BMI) 31.0-31.9, adult: Secondary | ICD-10-CM | POA: Diagnosis not present

## 2019-06-02 DIAGNOSIS — R002 Palpitations: Secondary | ICD-10-CM | POA: Diagnosis not present

## 2019-06-02 DIAGNOSIS — D0511 Intraductal carcinoma in situ of right breast: Secondary | ICD-10-CM | POA: Diagnosis not present

## 2019-06-02 DIAGNOSIS — R7301 Impaired fasting glucose: Secondary | ICD-10-CM | POA: Diagnosis not present

## 2019-06-02 DIAGNOSIS — E876 Hypokalemia: Secondary | ICD-10-CM | POA: Diagnosis not present

## 2019-06-09 ENCOUNTER — Other Ambulatory Visit: Payer: Self-pay | Admitting: Surgery

## 2019-06-09 DIAGNOSIS — C50911 Malignant neoplasm of unspecified site of right female breast: Secondary | ICD-10-CM

## 2019-06-11 ENCOUNTER — Other Ambulatory Visit: Payer: Self-pay | Admitting: Surgery

## 2019-06-11 DIAGNOSIS — C50911 Malignant neoplasm of unspecified site of right female breast: Secondary | ICD-10-CM

## 2019-06-11 NOTE — Progress Notes (Signed)
Location of Breast Cancer: Malignant neoplasm of lower outer quadrant of right breast, + + -  Did patient present with symptoms (if so, please note symptoms) or was this found on screening mammography?: Routine mammogram screening.  Diagnostic mammogram 05/20/2019: Suspicious 0.4 x 0.5 x 0.5 cm mass within the lower outer right breast.  No abnormal right axillary lymph nodes.  Histology per Pathology Report: Right Breast 05/27/2019  Receptor Status: ER(100% +), PR (70% +), Her2-neu (-), Ki-67(5%)    Past/Anticipated interventions by surgeon, if any: Dr. Ninfa Linden -Surgery 06/24/2019- radioactive seed guided right breast partial mastectomy with sentinel lymph node biopsy.  Past/Anticipated interventions by medical oncology, if any: Chemotherapy   Lymphedema issues, if any: No    Pain issues, if any:  No  SAFETY ISSUES:  Prior radiation? No  Pacemaker/ICD? No  Possible current pregnancy? Hysterectomy  Is the patient on methotrexate? No  Current Complaints / other details:      Cori Razor, RN 06/11/2019,12:53 PM

## 2019-06-12 ENCOUNTER — Encounter: Payer: Self-pay | Admitting: Radiation Oncology

## 2019-06-12 ENCOUNTER — Other Ambulatory Visit: Payer: Self-pay

## 2019-06-12 ENCOUNTER — Ambulatory Visit
Admission: RE | Admit: 2019-06-12 | Discharge: 2019-06-12 | Disposition: A | Discharge status: Home / Self Care | Payer: Medicare Other | Source: Ambulatory Visit | Attending: Radiation Oncology | Admitting: Radiation Oncology

## 2019-06-12 VITALS — Ht 61.0 in | Wt 162.0 lb

## 2019-06-12 DIAGNOSIS — C50511 Malignant neoplasm of lower-outer quadrant of right female breast: Secondary | ICD-10-CM

## 2019-06-12 DIAGNOSIS — C50311 Malignant neoplasm of lower-inner quadrant of right female breast: Secondary | ICD-10-CM | POA: Insufficient documentation

## 2019-06-12 DIAGNOSIS — Z170 Estrogen receptor positive status [ER+]: Secondary | ICD-10-CM | POA: Insufficient documentation

## 2019-06-12 DIAGNOSIS — Z809 Family history of malignant neoplasm, unspecified: Secondary | ICD-10-CM | POA: Diagnosis not present

## 2019-06-12 NOTE — Progress Notes (Signed)
Radiation Oncology         [phone] ________________________________  Initial Outpatient Consultation - Conducted via telephone due to current COVID-19 concerns for limiting patient exposure  I spoke with the patient to conduct this consult visit via telephone to spare the patient unnecessary potential exposure in the healthcare setting during the current COVID-19 pandemic. The patient was notified in advance and was offered a [place] meeting to allow for face to face communication but unfortunately reported that they did not have the appropriate resources/technology to support such a visit and instead preferred to proceed with a telephone consult.   ________________________________  Name: Miranda [name]        MRN: [id number]  Date of Service: 06/12/2019 DOB: [birth year]  LE:XNTZ, Edwinna Areola, MD  Coralie Keens, MD     REFERRING PHYSICIAN: Coralie Keens, MD   DIAGNOSIS: The primary encounter diagnosis was Malignant neoplasm of lower-outer quadrant of right breast of female, estrogen receptor positive ([place]). A diagnosis of Malignant neoplasm of lower-inner quadrant of right breast of female, estrogen receptor positive ([place]) was also pertinent to this visit.   HISTORY OF PRESENT ILLNESS: Miranda [name] is a 85 y.o. female seen in the multidisciplinary breast clinic for a new diagnosis of right breast cancer. The patient was noted to have a screening detected abnormality in the right breast which prompted diagnostic imaging. This revealed a 4 x 5 x 5 mm lesion in the 8:00 position of the right breast. She had a biopsy on 05/27/2019 that revealed a grade 1-2 , ER/PR positive, HER2 negative invasive ductal carcinoma. Her Ki 67 was 5%. She is scheduled to undergo lumpectomy with sentinel node biopsy on 06/24/2019 with Dr. Ninfa Linden. She is contacted by phone today to discuss adjuvant treatment options.   PREVIOUS RADIATION THERAPY: No   PAST MEDICAL HISTORY:  Past Medical History:    Diagnosis Date   Asthma    Cancer ([place])    Essential hypertension, benign    Mixed hyperlipidemia    Nephrolithiasis        PAST SURGICAL HISTORY: Past Surgical History:  Procedure Laterality Date   ABDOMINAL HYSTERECTOMY     excessive bleeding, no cancer    Left knee arthroscopy due to torn ligament and cartiallege  2006   [facility]    Left lung - lobectomy lower lobe - lung cancer       FAMILY HISTORY:  Family History  Problem Relation Age of Onset   Colon cancer Father    Heart attack Mother    Anuerysm Mother        Brain    Stroke Sister    Hypertension Sister    Melanoma Sister    Hypertension Sister    Hypertension Son    Cancer Other        Family history    Arthritis Other        Family history      SOCIAL HISTORY:  reports that she has never smoked. She has never used smokeless tobacco. She reports current alcohol use. She reports that she does not use drugs.   ALLERGIES: Bee venom   MEDICATIONS:  Current Outpatient Medications  Medication Sig Dispense Refill   Amlodipine-Valsartan-HCTZ (EXFORGE HCT) 5-160-25 MG TABS Take 1 tablet by mouth daily.     aspirin (ASPIRIN LOW DOSE) 81 MG EC tablet Take 81 mg by mouth daily.       atorvastatin (LIPITOR) 10 MG tablet Take 10 mg by mouth daily.  brimonidine (ALPHAGAN) 0.2 % ophthalmic solution Place 1 drop into the right eye 2 (two) times daily.  1   BROMSITE 0.075 % SOLN Place 1 drop into the left eye 2 (two) times daily.  4   fluocinonide cream (LIDEX) 1.69 % Apply 1 application topically 2 (two) times daily. Twice a day for 2 weeks, then off 1 week. Repeat once  5   ibuprofen (ADVIL,MOTRIN) 800 MG tablet TAKE 1 TABLET BY MOUTH EVERY 8 HOURS AS NEEDED 90 tablet 1   KLOR-CON M20 20 MEQ tablet Take 1 tablet by mouth daily. For 1 week then as needed there after  5   latanoprost (XALATAN) 0.005 % ophthalmic solution Place 1 drop into both eyes at bedtime.  3   methocarbamol  (ROBAXIN) 500 MG tablet Take 1 tablet (500 mg total) by mouth at bedtime. 60 tablet 1   metoprolol succinate (TOPROL-XL) 25 MG 24 hr tablet      metoprolol tartrate (LOPRESSOR) 25 MG tablet Take 25 mg by mouth daily as needed.  3   tamsulosin (FLOMAX) 0.4 MG CAPS capsule Take 1 capsule (0.4 mg total) by mouth daily. 30 capsule 0   telmisartan-hydrochlorothiazide (MICARDIS HCT) 80-25 MG tablet Take 1 tablet by mouth daily.  2   torsemide (DEMADEX) 20 MG tablet TAKE 1 TO 2 TABLETS BY MOUTH DAILY FOR 7 DAYS, THEN AS NEEDED  2   No current facility-administered medications for this encounter.      REVIEW OF SYSTEMS: On review of systems, the patient reports that she is doing well overall. She denies any chest pain, shortness of breath, cough, fevers, chills, night sweats, unintended weight changes. She denies any bowel or bladder disturbances, and denies abdominal pain, nausea or vomiting. She denies any new musculoskeletal or joint aches or pains. A complete review of systems is obtained and is otherwise negative.     PHYSICAL EXAM:  Wt Readings from Last 3 Encounters:  06/12/19 162 lb (73.5 kg)  05/06/18 165 lb (74.8 kg)  02/04/18 164 lb (74.4 kg)   Unable to assess due to nature of encounter.  ECOG = 0  0 - Asymptomatic (Fully active, able to carry on all predisease activities without restriction)  1 - Symptomatic but completely ambulatory (Restricted in physically strenuous activity but ambulatory and able to carry out work of a light or sedentary nature. For example, light housework, office work)  2 - Symptomatic, <50% in bed during the day (Ambulatory and capable of all self care but unable to carry out any work activities. Up and about more than 50% of waking hours)  3 - Symptomatic, >50% in bed, but not bedbound (Capable of only limited self-care, confined to bed or chair 50% or more of waking hours)  4 - Bedbound (Completely disabled. Cannot carry on any self-care. Totally  confined to bed or chair)  5 - Death   Eustace Pen MM, Creech RH, Tormey DC, et al. [phone]). "Toxicity and response criteria of the [facility]". [place] Oncol. 5 (6): 649-55    LABORATORY DATA:  Lab Results  Component Value Date   WBC 15.2 (H) 03/22/2017   HGB 13.8 03/22/2017   HCT 41.8 03/22/2017   MCV 82.1 03/22/2017   PLT 211 03/22/2017   Lab Results  Component Value Date   NA 140 03/22/2017   K 2.9 (L) 03/22/2017   CL 99 (L) 03/22/2017   CO2 31 03/22/2017   Lab Results  Component Value Date  ALT 26 03/22/2017   AST 25 03/22/2017   ALKPHOS 71 03/22/2017   BILITOT 1.0 03/22/2017      RADIOGRAPHY: US Breast Ltd Uni Right Inc Axilla  Result Date: 05/20/2019 CLINICAL DATA:  85 year old female for further evaluation of possible RIGHT breast mass on screening mammogram EXAM: DIGITAL DIAGNOSTIC RIGHT MAMMOGRAM WITH CAD AND TOMO ULTRASOUND RIGHT BREAST COMPARISON:  Previous exam(s). ACR Breast Density Category b: There are scattered areas of fibroglandular density. FINDINGS: 2D/3D spot compression views of the RIGHT breast demonstrate a persistent 0.6 cm obscured mass within the posterior LOWER OUTER LEFT breast. Mammographic images were processed with CAD. Targeted ultrasound is performed, showing a 0.4 x 0.5 x 0.5 cm irregular hypoechoic mass at the 8 o'clock position of the RIGHT breast 9 cm from the nipple. No abnormal RIGHT axillary lymph nodes are identified. IMPRESSION: 1. Suspicious 0.5 cm mass within the LOWER OUTER RIGHT breast. Tissue sampling recommended. No abnormal RIGHT axillary lymph nodes. RECOMMENDATION: Ultrasound-guided RIGHT breast biopsy, which will be scheduled. I have discussed the findings and recommendations with the patient. If applicable, a reminder letter will be sent to the patient regarding the next appointment. BI-RADS CATEGORY  4: Suspicious. Electronically Signed   By: Margarette Canada M.D.   On: 05/20/2019 10:03   Mm Diag Breast Tomo  Uni Right  Result Date: 05/20/2019 CLINICAL DATA:  85 year old female for further evaluation of possible RIGHT breast mass on screening mammogram EXAM: DIGITAL DIAGNOSTIC RIGHT MAMMOGRAM WITH CAD AND TOMO ULTRASOUND RIGHT BREAST COMPARISON:  Previous exam(s). ACR Breast Density Category b: There are scattered areas of fibroglandular density. FINDINGS: 2D/3D spot compression views of the RIGHT breast demonstrate a persistent 0.6 cm obscured mass within the posterior LOWER OUTER LEFT breast. Mammographic images were processed with CAD. Targeted ultrasound is performed, showing a 0.4 x 0.5 x 0.5 cm irregular hypoechoic mass at the 8 o'clock position of the RIGHT breast 9 cm from the nipple. No abnormal RIGHT axillary lymph nodes are identified. IMPRESSION: 1. Suspicious 0.5 cm mass within the LOWER OUTER RIGHT breast. Tissue sampling recommended. No abnormal RIGHT axillary lymph nodes. RECOMMENDATION: Ultrasound-guided RIGHT breast biopsy, which will be scheduled. I have discussed the findings and recommendations with the patient. If applicable, a reminder letter will be sent to the patient regarding the next appointment. BI-RADS CATEGORY  4: Suspicious. Electronically Signed   By: Margarette Canada M.D.   On: 05/20/2019 10:03   Mm Clip Placement Right  Result Date: 05/27/2019 CLINICAL DATA:  Post ultrasound-guided biopsy of a suspicious mass in the right breast at the 8 o'clock position. EXAM: DIAGNOSTIC RIGHT MAMMOGRAM POST ULTRASOUND BIOPSY COMPARISON:  Previous exam(s). FINDINGS: Mammographic images were obtained following ultrasound guided biopsy of a small mass in the right breast at the 8 o'clock position. A ribbon shaped biopsy marking clip is present at the site of the biopsied mass although the mass is obscured and not definitely visualized due to post biopsy change in this location. IMPRESSION: Ribbon shaped biopsy marking clip at site of biopsied mass in the right breast at the 8 o'clock position although  the mass is not definitely visualized on the post biopsy mammogram due to biopsy related changes in this location. Final Assessment: Post Procedure Mammograms for Marker Placement Electronically Signed   By: Everlean Alstrom M.D.   On: 05/27/2019 13:42   [place] Rt Breast Bx W Loc Dev 1st Lesion Img Bx Spec US Guide  Addendum Date: 05/29/2019   ADDENDUM REPORT: 05/29/2019 09:29 ADDENDUM:  PATHOLOGY: Breast, right,- INVASIVE DUCTAL CARCINOMA, GRADE 1/2. CONCORDANT: YES per Dr. Everlean Alstrom. I telephoned the patient on 05/28/2019 and discussed these results and the recommendations stated below. All questions were answered. The patient denies significant pain or bleeding from the biopsy site. Biopsy site care instructions were reviewed and the patient was asked to call [facility] mammography department with any questions or issues related to the biopsy. RECOMMENDATION: Surgical referral. Request for referral was relayed to Kathi Der, RT by Electa Sniff RN on 05/28/2019. Referral will be made by Dr. Juel Burrow office. Electa Sniff RN 05/29/2019. Electronically Signed   By: Everlean Alstrom M.D.   On: 05/29/2019 09:29   Result Date: 05/29/2019 CLINICAL DATA:  85 year old female with a suspicious mass in the right breast at the 8 o'clock position. EXAM: ULTRASOUND GUIDED RIGHT BREAST CORE NEEDLE BIOPSY COMPARISON:  Previous exam(s). FINDINGS: I met with the patient and we discussed the procedure of ultrasound-guided biopsy, including benefits and alternatives. We discussed the high likelihood of a successful procedure. We discussed the risks of the procedure, including infection, bleeding, tissue injury, clip migration, and inadequate sampling. Informed written consent was given. The usual time-out protocol was performed immediately prior to the procedure. Lesion quadrant: Lower outer Using sterile technique and 1% Lidocaine as local anesthetic, under direct ultrasound visualization, a 14 gauge spring-loaded device was  used to perform biopsy of the mass in the lower outer posterior right breast using a lateral to medial approach. At the conclusion of the procedure a ribbon tissue marker clip was deployed into the biopsy cavity. Follow up 2 view mammogram was performed and dictated separately. IMPRESSION: Ultrasound guided biopsy of the small mass in the lower outer right breast at the 8 o'clock position. No apparent complications. Electronically Signed: By: Everlean Alstrom M.D. On: 05/27/2019 13:41       IMPRESSION/PLAN: 1. Stage IA, cT1aN0M0 grade 1-2 ER/PR positive invasive ductal carcinoma of the right breast. Dr. Lisbeth Renshaw discusses the pathology findings and reviews the nature of right breast disease. She is planning on lumpectomy with sentinel node biopsy on 06/24/2019. Dr. Lisbeth Renshaw reviews the findings and favorable features of her biopsy results. We will follow up with the results of her upcoming surgery, but she may be able to avoid radiotherapy unless there was something significantly different about her disease. She is willing to consider antiestrogen therapy, and we will coordinate a meeting with medical oncology as well.  We discussed the risks, benefits, short, and long term effects of radiotherapy. Dr. Lisbeth Renshaw discusses the delivery and logistics of radiotherapy and anticipates a course of 4 or 6 1/2 weeks of radiotherapy, however at this time she may be able to forgo radiotherapy all together. We will be in contact with the patient if she would need to consider adjuvant radiotherapy.   Given current concerns for patient exposure during the COVID-19 pandemic, this encounter was conducted via telephone.  The patient has given verbal consent for this type of encounter. The time spent during this encounter was 30 minutes and 50% of that time was spent in the coordination of his care. The attendants for this meeting include Shona Simpson, [facility] and Winfred Leeds  During the encounter, Shona Simpson [facility] was located  at [facility] Radiation Oncology Department.  Miranda [name]  was located at home.     Carola Rhine, PAC

## 2019-06-16 ENCOUNTER — Telehealth: Payer: Self-pay | Admitting: Hematology and Oncology

## 2019-06-16 NOTE — Progress Notes (Signed)
[facility] CONSULT NOTE  Patient Care Team: Miranda Squibb, MD as PCP - General (Internal Medicine)  CHIEF COMPLAINTS/PURPOSE OF CONSULTATION:  Newly diagnosed breast cancer  HISTORY OF PRESENTING ILLNESS:  Miranda [name] 85 y.o. female is here because of recent diagnosis of invasive ductal carcinoma of the right breast. The cancer was detected on a routine screening mammogram on 05/12/19. Diagnostic mammogram and [place] on 05/20/19 showed 0.5cm mass in the right breast and no abnormal axillary lymph nodes. Biopsy on 05/27/19 confirmed invasive ductal carcinoma, grade 1, HER-2 negative (0), ER 100%, PR 70%, Ki67 5%. She presents to the clinic today for initial evaluation.   I reviewed her records extensively and collaborated the history with the patient.  SUMMARY OF ONCOLOGIC HISTORY: Oncology History  Malignant neoplasm of lower-inner quadrant of right breast of female, estrogen receptor positive ([place])  06/12/2019 Initial Diagnosis   Screening mammogram detected right breast asymmetry. Diagnostic mammogram and US showed 0.5cm right breast mass. Biopsy showed IDC, grade 1, HER-2 - (0), ER+ 100%, PR+ 70%, Ki67 5%.    06/12/2019 Cancer Staging   Staging form: Breast, AJCC 8th Edition - Clinical stage from 06/12/2019: Stage IA (cT1a, cN0, cM0, G2, ER+, PR+, HER2-) - Signed by Nicholas Lose, MD on 06/17/2019     MEDICAL HISTORY:  Past Medical History:  Diagnosis Date  . Asthma   . Cancer ([place])   . Essential hypertension, benign   . Mixed hyperlipidemia   . Nephrolithiasis     SURGICAL HISTORY: Past Surgical History:  Procedure Laterality Date  . ABDOMINAL HYSTERECTOMY     excessive bleeding, no cancer   . Left knee arthroscopy due to torn ligament and cartiallege  2006   [facility]   . Left lung - lobectomy lower lobe - lung cancer      SOCIAL HISTORY: Social History   Socioeconomic History  . Marital status: Divorced    Spouse name: Not on file  . Number of  children: 2  . Years of education: college  . Highest education level: Not on file  Occupational History  . Occupation: Public relations account executive for [employer]  . Financial resource strain: Not on file  . Food insecurity    Worry: Not on file    Inability: Not on file  . Transportation needs    Medical: No    Non-medical: No  Tobacco Use  . Smoking status: Never Smoker  . Smokeless tobacco: Never Used  Substance and Sexual Activity  . Alcohol use: Yes    Comment: occasional  . Drug use: No  . Sexual activity: Not on file  Lifestyle  . Physical activity    Days per week: Not on file    Minutes per session: Not on file  . Stress: Not on file  Relationships  . Social Herbalist on phone: Not on file    Gets together: Not on file    Attends religious service: Not on file    Active member of club or organization: Not on file    Attends meetings of clubs or organizations: Not on file    Relationship status: Not on file  . Intimate partner violence    Fear of current or ex partner: Not on file    Emotionally abused: Not on file    Physically abused: Not on file    Forced sexual activity: Not on file  Other Topics Concern  . Not on file  Social History Narrative  . Not on file    FAMILY HISTORY: Family History  Problem Relation Age of Onset  . Colon cancer Father   . Heart attack Mother   . Anuerysm Mother        Brain   . Stroke Sister   . Hypertension Sister   . Melanoma Sister   . Hypertension Sister   . Hypertension Son   . Cancer Other        Family history   . Arthritis Other        Family history     ALLERGIES:  is allergic to bee venom.  MEDICATIONS:  Current Outpatient Medications  Medication Sig Dispense Refill  . acetaminophen (TYLENOL) 500 MG tablet Take 500 mg by mouth every 6 (six) hours as needed for moderate pain or headache.    Marland Kitchen aspirin (ASPIRIN LOW DOSE) 81 MG EC tablet Take 81 mg by mouth daily.      .  cetirizine (ZYRTEC) 10 MG tablet Take 10 mg by mouth daily as needed for allergies.    . fluocinonide cream (LIDEX) 3.54 % Apply 1 application topically See admin instructions. Apply topically twice a day for 2 weeks, then off 1 week. Repeat once  5  . fluticasone (FLONASE) 50 MCG/ACT nasal spray Place 1 spray into both nostrils daily as needed for allergies or rhinitis.    Marland Kitchen KLOR-CON M20 20 MEQ tablet Take 20 mEq by mouth daily.   5  . latanoprost (XALATAN) 0.005 % ophthalmic solution Place 1 drop into both eyes at bedtime.  3  . metoprolol tartrate (LOPRESSOR) 25 MG tablet Take 25 mg by mouth daily as needed (rapid heart rate).   3  . Polyvinyl Alcohol-Povidone (REFRESH OP) Place 1 drop into both eyes daily as needed (dry eyes).    Marland Kitchen telmisartan-hydrochlorothiazide (MICARDIS HCT) 80-25 MG tablet Take 1 tablet by mouth daily.  2  . torsemide (DEMADEX) 20 MG tablet Take 20 mg by mouth daily as needed (swelling).     No current facility-administered medications for this visit.     REVIEW OF SYSTEMS:   Constitutional: Denies fevers, chills or abnormal night sweats Eyes: Denies blurriness of vision, double vision or watery eyes Ears, nose, mouth, throat, and face: Denies mucositis or sore throat Respiratory: Denies cough, dyspnea or wheezes Cardiovascular: Denies palpitation, chest discomfort or lower extremity swelling Gastrointestinal:  Denies nausea, heartburn or change in bowel habits Skin: Denies abnormal skin rashes Lymphatics: Denies new lymphadenopathy or easy bruising Neurological:Denies numbness, tingling or new weaknesses Behavioral/Psych: Mood is stable, no new changes  Breast: Denies any palpable lumps or discharge All other systems were reviewed with the patient and are negative.  PHYSICAL EXAMINATION: ECOG PERFORMANCE STATUS: 1 - Symptomatic but completely ambulatory  Vitals:   06/17/19 1440  BP: (!) 171/77  Pulse: 63  Temp: 99.1 F (37.3 C)  SpO2: 97%   Filed  Weights   06/17/19 1440  Weight: 162 lb 9.6 oz (73.8 kg)    GENERAL:alert, no distress and comfortable SKIN: skin color, texture, turgor are normal, no rashes or significant lesions EYES: normal, conjunctiva are pink and non-injected, sclera clear OROPHARYNX:no exudate, no erythema and lips, buccal mucosa, and tongue normal  NECK: supple, thyroid normal size, non-tender, without nodularity LYMPH:  no palpable lymphadenopathy in the cervical, axillary or inguinal LUNGS: clear to auscultation and percussion with normal breathing effort HEART: regular rate & rhythm and no murmurs and no lower extremity edema ABDOMEN:abdomen soft, non-tender  and normal bowel sounds Musculoskeletal:no cyanosis of digits and no clubbing  PSYCH: alert & oriented x 3 with fluent speech NEURO: no focal motor/sensory deficits BREAST: No palpable nodules in breast. No palpable axillary or supraclavicular lymphadenopathy (exam performed in the presence of a chaperone)   LABORATORY DATA:  I have reviewed the data as listed Lab Results  Component Value Date   WBC 15.2 (H) 03/22/2017   HGB 13.8 03/22/2017   HCT 41.8 03/22/2017   MCV 82.1 03/22/2017   PLT 211 03/22/2017   Lab Results  Component Value Date   NA 140 03/22/2017   K 2.9 (L) 03/22/2017   CL 99 (L) 03/22/2017   CO2 31 03/22/2017    RADIOGRAPHIC STUDIES: I have personally reviewed the radiological reports and agreed with the findings in the report.  ASSESSMENT AND PLAN:  Malignant neoplasm of lower-inner quadrant of right breast of female, estrogen receptor positive ([place]) 06/12/2019:Screening mammogram detected right breast asymmetry. Diagnostic mammogram and US showed 0.5cm right breast mass. Biopsy showed IDC, grade 1, HER-2 - (0), ER+ 100%, PR+ 70%, Ki67 5%. T1 a N0 stage Ia  Pathology and radiology counseling:Discussed with the patient, the details of pathology including the type of breast cancer,the clinical staging, the significance of ER,  PR and HER-2/neu receptors and the implications for treatment. After reviewing the pathology in detail, we proceeded to discuss the different treatment options between surgery, radiation, chemotherapy, antiestrogen therapies.  Recommendations: 1. Breast conserving surgery followed by 2. Adjuvant antiestrogen therapy  Based on favorable prognostic features and her advanced age, she does not need radiation therapy in my opinion.  We will discuss this in the tumor board.  Return to clinic after surgery to discuss final pathology report and then start antiestrogen therapy.   All questions were answered. The patient knows to call the clinic with any problems, questions or concerns.   Rulon Eisenmenger, MD 06/17/2019    I, Molly Dorshimer, am acting as scribe for Nicholas Lose, MD.  I have reviewed the above documentation for accuracy and completeness, and I agree with the above.

## 2019-06-16 NOTE — Telephone Encounter (Signed)
Received a new patient referral from Dr. Ninfa Linden for new dx of breast cancer. Pt has been cld and scheduled to see Dr. Lindi Adie on 7/21 at 3:15pm. Ms. Kochel is aware to arrive 20 minutes early.

## 2019-06-17 ENCOUNTER — Inpatient Hospital Stay: Payer: Medicare Other | Attending: Hematology and Oncology | Admitting: Hematology and Oncology

## 2019-06-17 ENCOUNTER — Other Ambulatory Visit: Payer: Self-pay

## 2019-06-17 DIAGNOSIS — Z85118 Personal history of other malignant neoplasm of bronchus and lung: Secondary | ICD-10-CM | POA: Diagnosis not present

## 2019-06-17 DIAGNOSIS — C50311 Malignant neoplasm of lower-inner quadrant of right female breast: Secondary | ICD-10-CM | POA: Diagnosis not present

## 2019-06-17 DIAGNOSIS — Z170 Estrogen receptor positive status [ER+]: Secondary | ICD-10-CM | POA: Diagnosis not present

## 2019-06-17 DIAGNOSIS — Z79899 Other long term (current) drug therapy: Secondary | ICD-10-CM | POA: Diagnosis not present

## 2019-06-17 DIAGNOSIS — Z800 Family history of malignant neoplasm of digestive organs: Secondary | ICD-10-CM | POA: Insufficient documentation

## 2019-06-17 DIAGNOSIS — Z7982 Long term (current) use of aspirin: Secondary | ICD-10-CM | POA: Insufficient documentation

## 2019-06-17 NOTE — Assessment & Plan Note (Signed)
06/12/2019:Screening mammogram detected right breast asymmetry. Diagnostic mammogram and US showed 0.5cm right breast mass. Biopsy showed IDC, grade 1, HER-2 - (0), ER+ 100%, PR+ 70%, Ki67 5%. T1 a N0 stage Ia  Pathology and radiology counseling:Discussed with the patient, the details of pathology including the type of breast cancer,the clinical staging, the significance of ER, PR and HER-2/neu receptors and the implications for treatment. After reviewing the pathology in detail, we proceeded to discuss the different treatment options between surgery, radiation, chemotherapy, antiestrogen therapies.  Recommendations: 1. Breast conserving surgery followed by 2. Adjuvant antiestrogen therapy  Based on favorable prognostic features and her advanced age, she does not need radiation therapy in my opinion.  We will discuss this in the tumor board.  Return to clinic after surgery to discuss final pathology report and then start antiestrogen therapy.

## 2019-06-18 ENCOUNTER — Telehealth: Payer: Self-pay | Admitting: Hematology and Oncology

## 2019-06-18 NOTE — Telephone Encounter (Signed)
I left a message regarding video visit  °

## 2019-06-18 NOTE — Progress Notes (Signed)
[id number] - [place], [place] - [address] [place] [address] Phone: [phone] Fax: [phone]      Your procedure is scheduled on July 28th.  Report to [facility] Main Entrance "A" at 1:15 PM, and check in at the Admitting office.  Call this number if you have problems the morning of surgery:  [phone]  Call [phone] if you have any questions prior to your surgery date Monday-Friday 8am-4pm    Remember:  Do not eat after midnight the night before your surgery  You may drink clear liquids until 12:15 PM the morning of your surgery.   Clear liquids allowed are: Water, Non-Citrus Juices (without pulp), Carbonated Beverages, Clear Tea, Black Coffee Only, and Gatorade    Take these medicines the morning of surgery with A SIP OF WATER   Tylenol - if needed  Zyrtec - if needed  Flonase nasal spray - if needed  Metoprolol   Eye drops - if needed  7 days prior to surgery STOP taking any Aspirin (unless otherwise instructed by your surgeon), Aleve, Naproxen, Ibuprofen, Motrin, Advil, Goody's, BC's, all herbal medications, fish oil, and all vitamins.    The Morning of Surgery  Do not wear jewelry, make-up or nail polish.  Do not wear lotions, powders, or perfumes, or deodorant  Do not shave 48 hours prior to surgery.  Do not bring valuables to the hospital.  [facility] is not responsible for any belongings or valuables.  If you are a smoker, DO NOT Smoke 24 hours prior to surgery IF you wear a CPAP at night please bring your mask, tubing, and machine the morning of surgery   Remember that you must have someone to transport you home after your surgery, and remain with you for 24 hours if you are discharged the same day.   Contacts, glasses, hearing aids, dentures or bridgework may not be worn into surgery.    Leave your suitcase in the car.  After surgery it may be brought to your room.  For patients admitted to  the hospital, discharge time will be determined by your treatment team.  Patients discharged the day of surgery will not be allowed to drive home.    Special instructions:   [place]- Preparing For Surgery  Before surgery, you can play an important role. Because skin is not sterile, your skin needs to be as free of germs as possible. You can reduce the number of germs on your skin by washing with CHG (chlorahexidine gluconate) Soap before surgery.  CHG is an antiseptic cleaner which kills germs and bonds with the skin to continue killing germs even after washing.    Oral Hygiene is also important to reduce your risk of infection.  Remember - BRUSH YOUR TEETH THE MORNING OF SURGERY WITH YOUR REGULAR TOOTHPASTE  Please do not use if you have an allergy to CHG or antibacterial soaps. If your skin becomes reddened/irritated stop using the CHG.  Do not shave (including legs and underarms) for at least 48 hours prior to first CHG shower. It is OK to shave your face.  Please follow these instructions carefully.   1. Shower the NIGHT BEFORE SURGERY and the MORNING OF SURGERY with CHG Soap.   2. If you chose to wash your hair, wash your hair first as usual with your normal shampoo.  3. After you shampoo, rinse your hair and body thoroughly to remove the shampoo.  4. Use CHG  as you would any other liquid soap. You can apply CHG directly to the skin and wash gently with a scrungie or a clean washcloth.   5. Apply the CHG Soap to your body ONLY FROM THE NECK DOWN.  Do not use on open wounds or open sores. Avoid contact with your eyes, ears, mouth and genitals (private parts). Wash Face and genitals (private parts)  with your normal soap.   6. Wash thoroughly, paying special attention to the area where your surgery will be performed.  7. Thoroughly rinse your body with warm water from the neck down.  8. DO NOT shower/wash with your normal soap after using and rinsing off the CHG Soap.  9. Pat  yourself dry with a CLEAN TOWEL.  10. Wear CLEAN PAJAMAS to bed the night before surgery, wear comfortable clothes the morning of surgery  11. Place CLEAN SHEETS on your bed the night of your first shower and DO NOT SLEEP WITH PETS.   Day of Surgery:  Do not apply any deodorants/lotions. Please shower the morning of surgery with the CHG soap  Please wear clean clothes to the hospital/surgery center.   Remember to brush your teeth WITH YOUR REGULAR TOOTHPASTE.   Please read over the following fact sheets that you were given.

## 2019-06-19 ENCOUNTER — Encounter (HOSPITAL_COMMUNITY)
Admission: RE | Admit: 2019-06-19 | Discharge: 2019-06-19 | Disposition: A | Discharge status: Home / Self Care | Payer: Medicare Other | Source: Ambulatory Visit | Attending: Surgery | Admitting: Surgery

## 2019-06-19 ENCOUNTER — Other Ambulatory Visit: Payer: Self-pay

## 2019-06-19 ENCOUNTER — Encounter: Payer: Self-pay | Admitting: *Deleted

## 2019-06-19 ENCOUNTER — Encounter (HOSPITAL_COMMUNITY): Payer: Self-pay

## 2019-06-19 DIAGNOSIS — R001 Bradycardia, unspecified: Secondary | ICD-10-CM | POA: Diagnosis not present

## 2019-06-19 DIAGNOSIS — I10 Essential (primary) hypertension: Secondary | ICD-10-CM | POA: Insufficient documentation

## 2019-06-19 DIAGNOSIS — C779 Secondary and unspecified malignant neoplasm of lymph node, unspecified: Secondary | ICD-10-CM

## 2019-06-19 DIAGNOSIS — C50911 Malignant neoplasm of unspecified site of right female breast: Secondary | ICD-10-CM

## 2019-06-19 DIAGNOSIS — Z01818 Encounter for other preprocedural examination: Secondary | ICD-10-CM | POA: Insufficient documentation

## 2019-06-19 DIAGNOSIS — R9431 Abnormal electrocardiogram [ECG] [EKG]: Secondary | ICD-10-CM | POA: Diagnosis not present

## 2019-06-19 DIAGNOSIS — I517 Cardiomegaly: Secondary | ICD-10-CM | POA: Diagnosis not present

## 2019-06-19 LAB — BASIC METABOLIC PANEL
Anion gap: 11 (ref 5–15)
BUN: 12 mg/dL (ref 8–23)
CO2: 29 mmol/L (ref 22–32)
Calcium: 9 mg/dL (ref 8.9–10.3)
Chloride: 98 mmol/L (ref 98–111)
Creatinine, Ser: 0.97 mg/dL (ref 0.44–1.00)
GFR calc Af Amer: >60 mL/min (ref >60)
GFR calc non Af Amer: 54 mL/min — ABNORMAL LOW (ref >60)
Glucose, Bld: 128 mg/dL — ABNORMAL HIGH (ref 70–99)
Potassium: 3 mmol/L — ABNORMAL LOW (ref 3.5–5.1)
Sodium: 138 mmol/L (ref 135–145)

## 2019-06-19 LAB — CBC
HCT: 42 % (ref 36.0–46.0)
Hemoglobin: 13.4 g/dL (ref 12.0–15.0)
MCH: 26.6 pg (ref 26.0–34.0)
MCHC: 31.9 g/dL (ref 30.0–36.0)
MCV: 83.5 fL (ref 80.0–100.0)
Platelets: 232 10*3/uL (ref 150–400)
RBC: 5.03 MIL/uL (ref 3.87–5.11)
RDW: 14.7 % (ref 11.5–15.5)
WBC: 5.8 10*3/uL (ref 4.0–10.5)
nRBC: 0 % (ref 0.0–0.2)

## 2019-06-19 MED ORDER — CHLORHEXIDINE GLUCONATE CLOTH 2 % EX PADS: 6.0000 | MEDICATED_PAD | Freq: Once | CUTANEOUS | Status: DC

## 2019-06-20 ENCOUNTER — Other Ambulatory Visit (HOSPITAL_COMMUNITY)
Admission: RE | Admit: 2019-06-20 | Discharge: 2019-06-20 | Disposition: A | Discharge status: Home / Self Care | Payer: Medicare Other | Source: Ambulatory Visit | Attending: Surgery | Admitting: Surgery

## 2019-06-20 DIAGNOSIS — C50911 Malignant neoplasm of unspecified site of right female breast: Secondary | ICD-10-CM

## 2019-06-20 DIAGNOSIS — R9431 Abnormal electrocardiogram [ECG] [EKG]: Secondary | ICD-10-CM | POA: Diagnosis not present

## 2019-06-20 DIAGNOSIS — I517 Cardiomegaly: Secondary | ICD-10-CM | POA: Diagnosis not present

## 2019-06-20 DIAGNOSIS — R001 Bradycardia, unspecified: Secondary | ICD-10-CM | POA: Diagnosis not present

## 2019-06-20 DIAGNOSIS — I10 Essential (primary) hypertension: Secondary | ICD-10-CM | POA: Diagnosis not present

## 2019-06-20 DIAGNOSIS — Z01818 Encounter for other preprocedural examination: Secondary | ICD-10-CM | POA: Diagnosis not present

## 2019-06-20 NOTE — Progress Notes (Signed)
Anesthesia Chart Review:  Case: [id number] Date/Time: 06/24/19 1500   Procedure: RADIOACTIVE SEED GUIDED RIGHT BREAST PARTIAL MASTECTOMY WITH  SENTINEL LYMPH NODE BIOPSY (Right Breast)   Anesthesia type: General   Pre-op diagnosis: RIGHT BREAST CANCER   Location: [facility] OR ROOM 08 / [facility]   Surgeon: Coralie Keens, MD      DISCUSSION: Patient is an 85 year old female scheduled for the above procedure.  History includes never smoker, hypertension, hyperlipidemia, asthma, right breast cancer (04/2019), left lung cancer (s/p LL lobectomy 2002, [place]).   She has a known abnormal EKG (anterolateral negative T waves, probably repolarization abnormality) dating back to at least 10/2011. She was having presyncope episodes at that time. Stress echo was non-ischemic. Event monitor showed burst of PSVT, and b-blocker therapy was initiated. T wave abnormality was not as prominent on her 12/06/11 EKG, but are again prominent on her 06/19/19 tracing. Possible new negative T wave in II, but there is also some motion in that lead. I called and spoke with patient. She denied chest pain, SOB, exertional dyspnea, edema, palpitations, syncope. Prior to COVID-19 restrictions, she was very active including going to the gym, walking 1-2 miles, playing Senior games such as bowling and Shuffleboard without CV symptoms. She takes torsemide as needed for LE edema, but hasn't needed in ~ 3 weeks. She takes daily KCL as she is on telmisartan/HCTZ for HTN. Reports she often has an elevated BP initially at doctor visits, but then comes down. Reportedly SBP in the 120's a visit with Dr. Nevada Crane a few months ago. (BP 171/67 at PAT).   She has a history of abnormal EKG as above, but recent good activity tolerance. She denied any CV symptoms. If no acute changes and COVID-19 test negative then I would anticipate that she can proceed as planned. Discussed with anesthesiologist Renold Don, MD.  06/20/19 pre-procedure COVID-19 test is in  process. She is scheduled for radioactive seed implant on 06/23/19.    VS: BP (!) 171/67   Pulse (!) 57   Temp 36.5 C   Resp 20   Ht 5\' 1"  (1.549 m)   Wt 73.9 kg   SpO2 98%   BMI 30.80 kg/m    PROVIDERS: Celene Squibb, MD is PCP  Nicholas Lose, MD is HEM-ONC She is not followed routinely by cardiology, but was seen by Rozann Lesches, MD in 2013 for abnormal EKG and near syncope. Work-up included event monitor, echo, and stress echo (see below). Metoprolol added for bursts of PSVT.   LABS: Preoperative labs noted. CBC WNL. Cr 0.97. K 3.0. Glucose 128. She confirmed she is taking her daily KCl supplement. (all labs ordered are listed, but only abnormal results are displayed)  Labs Reviewed  BASIC METABOLIC PANEL - Abnormal; Notable for the following components:      Result Value   Potassium 3.0 (*)    Glucose, Bld 128 (*)    GFR calc non Af Amer 54 (*)    All other components within normal limits  CBC    IMAGES: CXR 11/29/18: IMPRESSION: Enlargement of cardiac silhouette. No acute abnormalities.   EKG: 06/19/19: Sinus bradycardia with sinus arrhythmia Left ventricular hypertrophy with repolarization abnormality Abnormal ECG Confirmed by Sherren Mocha [phone]) on 06/19/2019 8:06:13 PM -  T wave abnormality present on 10 was not as prominent on 2012 tracing, although not as prominent on her 12/05/11 tracing. Possible new negative T wave in II, but there is also some motion in that lead. Overall, I  think her EKG is stable when compared to tracing scanned under "08/10/11" date.   CV: Stress Echo 12/20/11: Results as outlined by Dr. Domenic Polite, "Negative for ischemia by echocardiographic criteria. No unusual arrhythmias" and "no clear ischemia by exercise echocardiogram."  Echo 12/20/11: Results as outlined by Dr. Domenic Polite, "LVEF normal without obvious wall motion abnormality. No major valvular abnormalities. Overall reassuring."  Cardiac Event Monitor 1/11/3-1/24/13:   Summary: Sinus rhythm with bursts of PSVT, no pauses.   Past Medical History:  Diagnosis Date  . Asthma   . Cancer ([place])   . Essential hypertension, benign   . Mixed hyperlipidemia   . Nephrolithiasis     Past Surgical History:  Procedure Laterality Date  . ABDOMINAL HYSTERECTOMY     excessive bleeding, no cancer   . Left knee arthroscopy due to torn ligament and cartiallege  2006   [place]   . Left lung - lobectomy lower lobe - lung cancer      MEDICATIONS: . acetaminophen (TYLENOL) 500 MG tablet  . aspirin (ASPIRIN LOW DOSE) 81 MG EC tablet  . cetirizine (ZYRTEC) 10 MG tablet  . fluocinonide cream (LIDEX) 0.05 %  . fluticasone (FLONASE) 50 MCG/ACT nasal spray  . KLOR-CON M20 20 MEQ tablet  . latanoprost (XALATAN) 0.005 % ophthalmic solution  . metoprolol tartrate (LOPRESSOR) 25 MG tablet  . Polyvinyl Alcohol-Povidone (REFRESH OP)  . telmisartan-hydrochlorothiazide (MICARDIS HCT) 80-25 MG tablet  . torsemide (DEMADEX) 20 MG tablet   No current facility-administered medications for this encounter.   Demadex as needed for edema. Last ASA, 06/19/19.   Myra Gianotti, PA-C Surgical Short Stay/Anesthesiology [facility] Phone [phone] [facility] Phone [phone] 06/20/2019 1:53 PM

## 2019-06-20 NOTE — Anesthesia Preprocedure Evaluation (Addendum)
Anesthesia Evaluation  Patient identified by MRN, date of birth, ID band Patient awake    Reviewed: Allergy & Precautions, NPO status , Patient's Chart, lab work & pertinent test results  History of Anesthesia Complications Negative for: history of anesthetic complications  Airway Mallampati: I  TM Distance: >3 FB Neck ROM: Full    Dental  (+) Missing, Dental Advisory Given   Pulmonary  06/20/2019 SARS coronavirus NEG S/p LLLobectomy for cancer   breath sounds clear to auscultation       Cardiovascular hypertension, Pt. on medications and Pt. on home beta blockers (-) angina+ dysrhythmias Supra Ventricular Tachycardia  Rhythm:Regular Rate:Normal  Stress Echo 12/20/11: Results as outlined by Dr. Domenic Polite, "Negative for ischemia by echocardiographic criteria. No unusual arrhythmias" and "no clear ischemia by exercise echocardiogram."  Echo 12/20/11: Results as outlined by Dr. Domenic Polite, "LVEF normal without obvious wall motion abnormality. No major valvular abnormalities. Overall reassuring."   Neuro/Psych negative neurological ROS  negative psych ROS   GI/Hepatic negative GI ROS, Neg liver ROS,   Endo/Other  obese  Renal/GU negative Renal ROS     Musculoskeletal   Abdominal (+) + obese,   Peds  Hematology negative hematology ROS (+)   Anesthesia Other Findings   Reproductive/Obstetrics                            Anesthesia Physical Anesthesia Plan  ASA: III  Anesthesia Plan: General   Post-op Pain Management: GA combined w/ Regional for post-op pain   Induction: Intravenous  PONV Risk Score and Plan: 3 and Ondansetron, Dexamethasone and Treatment may vary due to age or medical condition  Airway Management Planned: LMA  Additional Equipment:   Intra-op Plan:   Post-operative Plan:   Informed Consent: I have reviewed the patients History and Physical, chart, labs and discussed the  procedure including the risks, benefits and alternatives for the proposed anesthesia with the patient or authorized representative who has indicated his/her understanding and acceptance.     Dental advisory given  Plan Discussed with: CRNA and Surgeon  Anesthesia Plan Comments: (PAT note written 06/20/2019 by Myra Gianotti, PA-C. Plan routine monitors, GA with pectoralis block for post op analgesia)       Anesthesia Quick Evaluation

## 2019-06-21 LAB — SARS CORONAVIRUS 2 (TAT 6-24 HRS): SARS Coronavirus 2: NEGATIVE

## 2019-06-23 ENCOUNTER — Ambulatory Visit
Admission: RE | Admit: 2019-06-23 | Discharge: 2019-06-23 | Disposition: A | Discharge status: Home / Self Care | Payer: Medicare Other | Source: Ambulatory Visit | Attending: Surgery | Admitting: Surgery

## 2019-06-23 ENCOUNTER — Other Ambulatory Visit: Payer: Self-pay

## 2019-06-23 DIAGNOSIS — C50911 Malignant neoplasm of unspecified site of right female breast: Secondary | ICD-10-CM

## 2019-06-23 DIAGNOSIS — C50511 Malignant neoplasm of lower-outer quadrant of right female breast: Secondary | ICD-10-CM | POA: Diagnosis not present

## 2019-06-23 DIAGNOSIS — C779 Secondary and unspecified malignant neoplasm of lymph node, unspecified: Secondary | ICD-10-CM

## 2019-06-23 NOTE — H&P (Signed)
Winfred Leeds Documented: 06/09/2019 2:40 PM Location: [facility] Patient #: [id number] DOB: 30-Oct-1934 Divorced / Language: Cleophus Molt / Race: Black or African American Female   History of Present Illness (Luz Mares A. Ninfa Linden MD; 06/09/2019 3:02 PM) The patient is a 85 year old female who presents with breast cancer. This is a very pleasant 85 year old female referred by Dr. Allyn Kenner for evaluation of a right breast cancer. She had gone for her screening mammography when a small abnormality was seen in the right breast. Follow-up diagnostic films and ultrasound performed showing a 6 mm mass in the right breast. A stereotactic biopsy was performed showing an invasive breast cancer which is 100% ER and 70% PR positive. Ki-67 was 5%. HER-2 was negative. Her ultrasound of her axilla was negative as well. She has had no previous surgery on her breast and there is no family history of breast cancer. She denies nipple discharge. She is otherwise healthy and without complaints. She is accompanied today by her daughter.   Past Surgical History Nance Pew, [place]; 06/09/2019 2:41 PM) Breast Biopsy  Bilateral. Cataract Surgery  Bilateral. Hysterectomy (not due to cancer) - Partial  Lung Surgery  Left.  Diagnostic Studies History Nance Pew, [place]; 06/09/2019 2:41 PM) Colonoscopy  1-5 years ago Mammogram  within last year Pap Smear  1-5 years ago  Allergies Nance Pew, CMA; 06/09/2019 2:41 PM) No Known Allergies  [06/09/2019]: No Known Drug Allergies  [06/09/2019]: Allergies Reconciled   Medication History Nance Pew, CMA; 06/09/2019 2:43 PM) Metoprolol Succinate ER (25MG Tablet ER 24HR, Oral) Active. Telmisartan (40MG Tablet, Oral) Active. Torsemide (5MG Tablet, Oral) Active. Aspirin (81MG Tablet DR, Oral) Active. Atorvastatin Calcium (10MG Tablet, Oral) Active. Medications Reconciled  Social History Nance Pew, CMA; 06/09/2019 2:41  PM) Alcohol use  Occasional alcohol use. Caffeine use  Coffee. No drug use  Tobacco use  Never smoker.  Family History Nance Pew, [place]; 06/09/2019 2:41 PM) Arthritis  Sister. Cerebrovascular Accident  Mother. Colon Cancer  Father. Depression  Son. Diabetes Mellitus  Son. Hypertension  Brother, Daughter, Father, Mother, Sister, Son.  Pregnancy / Birth History Nance Pew, [place]; 06/09/2019 2:41 PM) Age at menarche  59 years. Age of menopause  68-55 Gravida  2 Maternal age  73-20 Para  2  Other Problems Nance Pew, [place]; 06/09/2019 2:41 PM) Arthritis  Back Pain  High blood pressure  Hypercholesterolemia  Kidney Stone  Lung Cancer     Review of Systems ([place]; 06/09/2019 2:41 PM) General Not Present- Appetite Loss, Chills, Fatigue, Fever, Night Sweats, Weight Gain and Weight Loss. Skin Not Present- Change in Wart/Mole, Dryness, Hives, Jaundice, New Lesions, Non-Healing Wounds, Rash and Ulcer. HEENT Not Present- Earache, Hearing Loss, Hoarseness, Nose Bleed, Oral Ulcers, Ringing in the Ears, Seasonal Allergies, Sinus Pain, Sore Throat, Visual Disturbances, Wears glasses/contact lenses and Yellow Eyes. Respiratory Not Present- Bloody sputum, Chronic Cough, Difficulty Breathing, Snoring and Wheezing. Cardiovascular Present- Rapid Heart Rate. Not Present- Chest Pain, Difficulty Breathing Lying Down, Leg Cramps, Palpitations, Shortness of Breath and Swelling of Extremities. Gastrointestinal Not Present- Abdominal Pain, Bloating, Bloody Stool, Change in Bowel Habits, Chronic diarrhea, Constipation, Difficulty Swallowing, Excessive gas, Gets full quickly at meals, Hemorrhoids, Indigestion, Nausea, Rectal Pain and Vomiting. Female Genitourinary Not Present- Frequency, Nocturia, Painful Urination, Pelvic Pain and Urgency. Musculoskeletal Present- Back Pain. Not Present- Joint Pain, Joint Stiffness, Muscle Pain, Muscle Weakness and Swelling of  Extremities. Neurological Not Present- Decreased Memory, Fainting, Headaches, Numbness, Seizures, Tingling, Tremor, Trouble walking and  Weakness. Psychiatric Not Present- Anxiety, Bipolar, Change in Sleep Pattern, Depression, Fearful and Frequent crying. Endocrine Not Present- Cold Intolerance, Excessive Hunger, Hair Changes, Heat Intolerance, Hot flashes and New Diabetes. Hematology Not Present- Blood Thinners, Easy Bruising, Excessive bleeding, Gland problems, HIV and Persistent Infections.  Vitals (Sabrina Canty CMA; 06/09/2019 2:43 PM) 06/09/2019 2:43 PM Weight: 16.5 lb Height: 61in Body Surface Area: 0.65 m Body Mass Index: 3.12 kg/m  Temp.: 98.69F (Oral)  Pulse: 97 (Regular)  BP: 140/84(Sitting, Left Arm, Standard)       Physical Exam (Naudia Crosley A. Ninfa Linden MD; 06/09/2019 3:02 PM) General Mental Status-Alert. General Appearance-Consistent with stated age. Hydration-Well hydrated. Voice-Normal.  Head and Neck Head-normocephalic, atraumatic with no lesions or palpable masses. Trachea-midline. Thyroid Gland Characteristics - normal size and consistency.  Eye Eyeball - Bilateral-Extraocular movements intact. Sclera/Conjunctiva - Bilateral-No scleral icterus.  Chest and Lung Exam Chest and lung exam reveals -quiet, even and easy respiratory effort with no use of accessory muscles and on auscultation, normal breath sounds, no adventitious sounds and normal vocal resonance. Inspection Chest Wall - Normal. Back - normal.  Breast Breast - Left-Symmetric, Non Tender, No Biopsy scars, no Dimpling - Left, No Inflammation, No Lumpectomy scars, No Mastectomy scars, No Peau d' Orange. Breast - Right-Symmetric, Non Tender, No Biopsy scars, no Dimpling - Right, No Inflammation, No Lumpectomy scars, No Mastectomy scars, No Peau d' Orange. Breast Lump-No Palpable Breast Mass.  Cardiovascular Cardiovascular examination reveals -normal heart sounds,  regular rate and rhythm with no murmurs and normal pedal pulses bilaterally.  Abdomen - Did not examine.  Neurologic Neurologic evaluation reveals -alert and oriented x 3 with no impairment of recent or remote memory. Mental Status-Normal.  Musculoskeletal Normal Exam - Left-Upper Extremity Strength Normal and Lower Extremity Strength Normal. Normal Exam - Right-Upper Extremity Strength Normal and Lower Extremity Strength Normal.  Lymphatic Head & Neck  General Head & Neck Lymphatics: Bilateral - Description - Normal. Axillary  General Axillary Region: Bilateral - Description - Normal. Tenderness - Non Tender. Femoral & Inguinal - Did not examine.    Assessment & Plan (Azalynn Maxim A. Ninfa Linden MD; 06/09/2019 3:03 PM)  BREAST CANCER, RIGHT (C50.911) Impression: I have reviewed her ultrasound, mammogram, and pathology results. I gave a copy of the pathology report to her. She has an invasive right breast cancer which is ER and PR positive. It is a small cancer. We discussed breast cancer in detail. We discussed breast conservation versus mastectomy. We discussed and hormonal therapy as well as radiation therapy. Conservatively, we discussed proceeding with a radioactive seed guided right breast partial mastectomy with sentinel lymph node biopsy. I discussed the surgical procedure in detail. We discussed the risks of surgery which includes but is not limited to bleeding, infection, injury to surrounding structures, the need for further surgery if margins are positive, cardiopulmonary issues, postoperative recovery, etc. We also discussed referral postoperatively to both radiation and medical oncology. They agreed with the plan and surgery will be scheduled

## 2019-06-24 ENCOUNTER — Ambulatory Visit (HOSPITAL_COMMUNITY)
Admission: RE | Admit: 2019-06-24 | Discharge: 2019-06-24 | Disposition: A | Discharge status: Home / Self Care | Payer: Medicare Other | Attending: Surgery | Admitting: Surgery

## 2019-06-24 ENCOUNTER — Encounter (HOSPITAL_COMMUNITY)
Admission: RE | Admit: 2019-06-24 | Discharge: 2019-06-24 | Disposition: A | Discharge status: Home / Self Care | Payer: Medicare Other | Source: Ambulatory Visit | Attending: Surgery | Admitting: Surgery

## 2019-06-24 ENCOUNTER — Encounter (HOSPITAL_COMMUNITY)
Admission: RE | Disposition: A | Discharge status: Home / Self Care | Payer: Self-pay | Source: Home / Self Care | Attending: Surgery

## 2019-06-24 ENCOUNTER — Ambulatory Visit (HOSPITAL_COMMUNITY): Payer: Medicare Other | Admitting: Anesthesiology

## 2019-06-24 ENCOUNTER — Ambulatory Visit
Admission: RE | Admit: 2019-06-24 | Discharge: 2019-06-24 | Disposition: A | Discharge status: Home / Self Care | Payer: Medicare Other | Source: Ambulatory Visit | Attending: Surgery | Admitting: Surgery

## 2019-06-24 ENCOUNTER — Ambulatory Visit (HOSPITAL_COMMUNITY): Payer: Medicare Other | Admitting: Vascular Surgery

## 2019-06-24 ENCOUNTER — Encounter (HOSPITAL_COMMUNITY): Payer: Self-pay

## 2019-06-24 DIAGNOSIS — I10 Essential (primary) hypertension: Secondary | ICD-10-CM | POA: Insufficient documentation

## 2019-06-24 DIAGNOSIS — M1990 Unspecified osteoarthritis, unspecified site: Secondary | ICD-10-CM | POA: Diagnosis not present

## 2019-06-24 DIAGNOSIS — I498 Other specified cardiac arrhythmias: Secondary | ICD-10-CM | POA: Diagnosis not present

## 2019-06-24 DIAGNOSIS — J45909 Unspecified asthma, uncomplicated: Secondary | ICD-10-CM | POA: Diagnosis not present

## 2019-06-24 DIAGNOSIS — Z7982 Long term (current) use of aspirin: Secondary | ICD-10-CM | POA: Insufficient documentation

## 2019-06-24 DIAGNOSIS — Z170 Estrogen receptor positive status [ER+]: Secondary | ICD-10-CM | POA: Diagnosis not present

## 2019-06-24 DIAGNOSIS — R928 Other abnormal and inconclusive findings on diagnostic imaging of breast: Secondary | ICD-10-CM | POA: Diagnosis not present

## 2019-06-24 DIAGNOSIS — E7800 Pure hypercholesterolemia, unspecified: Secondary | ICD-10-CM | POA: Diagnosis not present

## 2019-06-24 DIAGNOSIS — Z6830 Body mass index (BMI) 30.0-30.9, adult: Secondary | ICD-10-CM | POA: Diagnosis not present

## 2019-06-24 DIAGNOSIS — Z85118 Personal history of other malignant neoplasm of bronchus and lung: Secondary | ICD-10-CM | POA: Diagnosis not present

## 2019-06-24 DIAGNOSIS — C50511 Malignant neoplasm of lower-outer quadrant of right female breast: Secondary | ICD-10-CM | POA: Diagnosis not present

## 2019-06-24 DIAGNOSIS — C50911 Malignant neoplasm of unspecified site of right female breast: Secondary | ICD-10-CM | POA: Diagnosis not present

## 2019-06-24 DIAGNOSIS — Z79899 Other long term (current) drug therapy: Secondary | ICD-10-CM | POA: Diagnosis not present

## 2019-06-24 DIAGNOSIS — Z20828 Contact with and (suspected) exposure to other viral communicable diseases: Secondary | ICD-10-CM | POA: Diagnosis not present

## 2019-06-24 DIAGNOSIS — E669 Obesity, unspecified: Secondary | ICD-10-CM | POA: Insufficient documentation

## 2019-06-24 DIAGNOSIS — G8918 Other acute postprocedural pain: Secondary | ICD-10-CM | POA: Diagnosis not present

## 2019-06-24 HISTORY — PX: RADIOACTIVE SEED GUIDED PARTIAL MASTECTOMY WITH AXILLARY SENTINEL LYMPH NODE BIOPSY: SHX6520

## 2019-06-24 SURGERY — RADIOACTIVE SEED GUIDED PARTIAL MASTECTOMY WITH AXILLARY SENTINEL LYMPH NODE BIOPSY
Anesthesia: General | Site: Breast | Laterality: Right

## 2019-06-24 MED ORDER — PROPOFOL 10 MG/ML IV BOLUS
INTRAVENOUS | Status: DC | PRN
  Administered 2019-06-24: 100 mg via INTRAVENOUS

## 2019-06-24 MED ORDER — LACTATED RINGERS IV SOLN
INTRAVENOUS | Status: DC
  Administered 2019-06-24: 13:00:00 via INTRAVENOUS

## 2019-06-24 MED ORDER — BUPIVACAINE-EPINEPHRINE 0.25% -1:200000 IJ SOLN
INTRAMUSCULAR | Status: DC | PRN
  Administered 2019-06-24: 12 mL

## 2019-06-24 MED ORDER — GLYCOPYRROLATE PF 0.2 MG/ML IJ SOSY
PREFILLED_SYRINGE | INTRAMUSCULAR | Status: AC
  Filled 2019-06-24: qty 1

## 2019-06-24 MED ORDER — FENTANYL CITRATE (PF) 100 MCG/2ML IJ SOLN
INTRAMUSCULAR | Status: AC
  Administered 2019-06-24: 14:00:00 50 ug via INTRAVENOUS
  Filled 2019-06-24: qty 2

## 2019-06-24 MED ORDER — CEFAZOLIN SODIUM-DEXTROSE 2-4 GM/100ML-% IV SOLN
2.0000 g | INTRAVENOUS | Status: AC
  Administered 2019-06-24: 15:00:00 2 g via INTRAVENOUS
  Filled 2019-06-24: qty 100

## 2019-06-24 MED ORDER — ONDANSETRON HCL 4 MG/2ML IJ SOLN
INTRAMUSCULAR | Status: DC | PRN
  Administered 2019-06-24: 4 mg via INTRAVENOUS

## 2019-06-24 MED ORDER — FENTANYL CITRATE (PF) 250 MCG/5ML IJ SOLN
INTRAMUSCULAR | Status: DC | PRN
  Administered 2019-06-24: 25 ug via INTRAVENOUS

## 2019-06-24 MED ORDER — TECHNETIUM TC 99M SULFUR COLLOID FILTERED
1.0000 | Freq: Once | INTRAVENOUS | Status: AC | PRN
  Administered 2019-06-24: 14:00:00 1 via INTRADERMAL

## 2019-06-24 MED ORDER — DEXAMETHASONE SODIUM PHOSPHATE 10 MG/ML IJ SOLN
INTRAMUSCULAR | Status: DC | PRN
  Administered 2019-06-24: 4 mg via INTRAVENOUS

## 2019-06-24 MED ORDER — 0.9 % SODIUM CHLORIDE (POUR BTL) OPTIME
TOPICAL | Status: DC | PRN
  Administered 2019-06-24: 1000 mL

## 2019-06-24 MED ORDER — MIDAZOLAM HCL 2 MG/2ML IJ SOLN
0.5000 mg | Freq: Once | INTRAMUSCULAR | Status: AC
  Administered 2019-06-24: 14:00:00 0.5 mg via INTRAVENOUS

## 2019-06-24 MED ORDER — ACETAMINOPHEN 500 MG PO TABS
1000.0000 mg | ORAL_TABLET | ORAL | Status: AC
  Administered 2019-06-24: 14:00:00 1000 mg via ORAL
  Filled 2019-06-24: qty 2

## 2019-06-24 MED ORDER — FENTANYL CITRATE (PF) 100 MCG/2ML IJ SOLN
50.0000 ug | Freq: Once | INTRAMUSCULAR | Status: AC
  Administered 2019-06-24: 14:00:00 50 ug via INTRAVENOUS

## 2019-06-24 MED ORDER — FENTANYL CITRATE (PF) 250 MCG/5ML IJ SOLN
INTRAMUSCULAR | Status: AC
  Filled 2019-06-24: qty 5

## 2019-06-24 MED ORDER — PROPOFOL 10 MG/ML IV BOLUS
INTRAVENOUS | Status: AC
  Filled 2019-06-24: qty 20

## 2019-06-24 MED ORDER — ONDANSETRON HCL 4 MG/2ML IJ SOLN
INTRAMUSCULAR | Status: AC
  Filled 2019-06-24: qty 2

## 2019-06-24 MED ORDER — EPHEDRINE SULFATE-NACL 50-0.9 MG/10ML-% IV SOSY
PREFILLED_SYRINGE | INTRAVENOUS | Status: DC | PRN
  Administered 2019-06-24 (×2): 10 mg via INTRAVENOUS

## 2019-06-24 MED ORDER — TRAMADOL HCL 50 MG PO TABS: 50.0000 mg | ORAL_TABLET | Freq: Four times a day (QID) | ORAL | 0 refills | Status: DC | PRN

## 2019-06-24 MED ORDER — GABAPENTIN 300 MG PO CAPS
300.0000 mg | ORAL_CAPSULE | ORAL | Status: AC
  Administered 2019-06-24: 300 mg via ORAL
  Filled 2019-06-24: qty 1

## 2019-06-24 MED ORDER — LIDOCAINE 2% (20 MG/ML) 5 ML SYRINGE
INTRAMUSCULAR | Status: AC
  Filled 2019-06-24: qty 5

## 2019-06-24 MED ORDER — PHENYLEPHRINE 40 MCG/ML (10ML) SYRINGE FOR IV PUSH (FOR BLOOD PRESSURE SUPPORT)
PREFILLED_SYRINGE | INTRAVENOUS | Status: AC
  Filled 2019-06-24: qty 10

## 2019-06-24 MED ORDER — MIDAZOLAM HCL 2 MG/2ML IJ SOLN
INTRAMUSCULAR | Status: AC
  Administered 2019-06-24: 0.5 mg via INTRAVENOUS
  Filled 2019-06-24: qty 2

## 2019-06-24 MED ORDER — GLYCOPYRROLATE PF 0.2 MG/ML IJ SOSY
PREFILLED_SYRINGE | INTRAMUSCULAR | Status: DC | PRN
  Administered 2019-06-24 (×2): .1 mg via INTRAVENOUS

## 2019-06-24 MED ORDER — BUPIVACAINE-EPINEPHRINE (PF) 0.25% -1:200000 IJ SOLN
INTRAMUSCULAR | Status: AC
  Filled 2019-06-24: qty 30

## 2019-06-24 MED ORDER — BUPIVACAINE-EPINEPHRINE (PF) 0.5% -1:200000 IJ SOLN
INTRAMUSCULAR | Status: DC | PRN
  Administered 2019-06-24: 30 mL

## 2019-06-24 MED ORDER — METHYLENE BLUE 0.5 % INJ SOLN
INTRAVENOUS | Status: AC
  Filled 2019-06-24: qty 10

## 2019-06-24 MED ORDER — DEXAMETHASONE SODIUM PHOSPHATE 10 MG/ML IJ SOLN
INTRAMUSCULAR | Status: AC
  Filled 2019-06-24: qty 1

## 2019-06-24 SURGICAL SUPPLY — 42 items
ADH SKN CLS APL DERMABOND .7 (GAUZE/BANDAGES/DRESSINGS) ×1
APL PRP STRL LF DISP 70% ISPRP (MISCELLANEOUS) ×1
APPLIER CLIP 9.375 MED OPEN (MISCELLANEOUS) ×3
APR CLP MED 9.3 20 MLT OPN (MISCELLANEOUS) ×1
BINDER BREAST LRG (GAUZE/BANDAGES/DRESSINGS) IMPLANT
BINDER BREAST XLRG (GAUZE/BANDAGES/DRESSINGS) IMPLANT
CANISTER SUCT 3000ML PPV (MISCELLANEOUS) IMPLANT
CHLORAPREP W/TINT 26 (MISCELLANEOUS) ×3 IMPLANT
CLIP APPLIE 9.375 MED OPEN (MISCELLANEOUS) ×1 IMPLANT
CONT SPEC 4OZ CLIKSEAL STRL BL (MISCELLANEOUS) IMPLANT
COVER PROBE W GEL 5X96 (DRAPES) ×3 IMPLANT
COVER SURGICAL LIGHT HANDLE (MISCELLANEOUS) ×3 IMPLANT
COVER WAND RF STERILE (DRAPES) ×3 IMPLANT
DERMABOND ADVANCED (GAUZE/BANDAGES/DRESSINGS) ×2
DERMABOND ADVANCED .7 DNX12 (GAUZE/BANDAGES/DRESSINGS) ×1 IMPLANT
DEVICE DUBIN SPECIMEN MAMMOGRA (MISCELLANEOUS) ×3 IMPLANT
DRAPE CHEST BREAST 15X10 FENES (DRAPES) ×3 IMPLANT
ELECT CAUTERY BLADE 6.4 (BLADE) ×3 IMPLANT
ELECT REM PT RETURN 9FT ADLT (ELECTROSURGICAL) ×3
ELECTRODE REM PT RTRN 9FT ADLT (ELECTROSURGICAL) ×1 IMPLANT
GLOVE SURG SIGNA 7.5 PF LTX (GLOVE) ×6 IMPLANT
GOWN STRL REUS W/ TWL LRG LVL3 (GOWN DISPOSABLE) ×1 IMPLANT
GOWN STRL REUS W/ TWL XL LVL3 (GOWN DISPOSABLE) ×1 IMPLANT
GOWN STRL REUS W/TWL LRG LVL3 (GOWN DISPOSABLE) ×3
GOWN STRL REUS W/TWL XL LVL3 (GOWN DISPOSABLE) ×3
KIT BASIN OR (CUSTOM PROCEDURE TRAY) ×3 IMPLANT
KIT MARKER MARGIN INK (KITS) ×3 IMPLANT
NDL 18GX1X1/2 (RX/OR ONLY) (NEEDLE) IMPLANT
NDL FILTER BLUNT 18X1 1/2 (NEEDLE) IMPLANT
NDL HYPO 25GX1X1/2 BEV (NEEDLE) ×1 IMPLANT
NEEDLE 18GX1X1/2 (RX/OR ONLY) (NEEDLE) IMPLANT
NEEDLE FILTER BLUNT 18X 1/2SAF (NEEDLE)
NEEDLE FILTER BLUNT 18X1 1/2 (NEEDLE) IMPLANT
NEEDLE HYPO 25GX1X1/2 BEV (NEEDLE) ×3 IMPLANT
NS IRRIG 1000ML POUR BTL (IV SOLUTION) IMPLANT
PACK GENERAL/GYN (CUSTOM PROCEDURE TRAY) ×3 IMPLANT
SUT MNCRL AB 4-0 PS2 18 (SUTURE) ×3 IMPLANT
SUT SILK 2 0 SH (SUTURE) IMPLANT
SUT VIC AB 3-0 SH 18 (SUTURE) ×3 IMPLANT
SYR CONTROL 10ML LL (SYRINGE) ×3 IMPLANT
TOWEL GREEN STERILE (TOWEL DISPOSABLE) ×3 IMPLANT
TOWEL GREEN STERILE FF (TOWEL DISPOSABLE) ×3 IMPLANT

## 2019-06-24 NOTE — Transfer of Care (Signed)
Immediate Anesthesia Transfer of Care Note  Patient: Miranda [name]  Procedure(s) Performed: RADIOACTIVE SEED GUIDED RIGHT BREAST PARTIAL MASTECTOMY WITH  SENTINEL LYMPH NODE BIOPSY (Right Breast)  Patient Location: PACU  Anesthesia Type:GA combined with regional for post-op pain  Level of Consciousness: awake, alert  and patient cooperative  Airway & Oxygen Therapy: Patient Spontanous Breathing and Patient connected to nasal cannula oxygen  Post-op Assessment: Report given to RN and Post -op Vital signs reviewed and stable  Post vital signs: Reviewed and stable  Last Vitals:  Vitals Value Taken Time  BP 151/89 06/24/19 1542  Temp 36.4 C 06/24/19 1542  Pulse 78 06/24/19 1542  Resp 20 06/24/19 1542  SpO2 100 % 06/24/19 1542  Vitals shown include unvalidated device data.  Last Pain:  Vitals:   06/24/19 1306  TempSrc: Oral         Complications: No apparent anesthesia complications

## 2019-06-24 NOTE — Interval H&P Note (Signed)
History and Physical Interval Note: no change in H and P  06/24/2019 1:05 PM  Miranda [name]  has presented today for surgery, with the diagnosis of RIGHT BREAST CANCER.  The various methods of treatment have been discussed with the patient and family. After consideration of risks, benefits and other options for treatment, the patient has consented to  Procedure(s): RADIOACTIVE SEED GUIDED RIGHT BREAST PARTIAL MASTECTOMY WITH  SENTINEL LYMPH NODE BIOPSY (Right) as a surgical intervention.  The patient's history has been reviewed, patient examined, no change in status, stable for surgery.  I have reviewed the patient's chart and labs.  Questions were answered to the patient's satisfaction.     Coralie Keens

## 2019-06-24 NOTE — Anesthesia Procedure Notes (Signed)
Procedure Name: LMA Insertion Date/Time: 06/24/2019 2:55 PM Performed by: Renato Shin, CRNA Pre-anesthesia Checklist: Patient identified, Emergency Drugs available, Suction available and Patient being monitored Patient Re-evaluated:Patient Re-evaluated prior to induction Oxygen Delivery Method: Circle system utilized Preoxygenation: Pre-oxygenation with 100% oxygen Induction Type: IV induction LMA: LMA inserted LMA Size: 4.0 Number of attempts: 2 Placement Confirmation: positive ETCO2 and breath sounds checked- equal and bilateral Tube secured with: Tape Dental Injury: Teeth and Oropharynx as per pre-operative assessment

## 2019-06-24 NOTE — Op Note (Signed)
Procedure Note  Miranda [name] 06/24/2019   Pre-op Diagnosis: RIGHT BREAST CANCER     Post-op Diagnosis: same  Procedure(s): RADIOACTIVE SEED GUIDED RIGHT BREAST PARTIAL MASTECTOMY WITH  DEEP RIGHT AXILLARY SENTINEL LYMPH NODE BIOPSY  Surgeon(s): Coralie Keens, MD  Anesthesia: General  Staff:  Circulator: Jeanie Cooks, RN Scrub Person: Teschner, Mindy K, CST  Estimated Blood Loss: Minimal               Specimens: SENT TO PATH  Indications: This is an 85 year old female with a recent diagnosis of a small invasive right breast cancer.  The decision has been made to proceed with a right breast radioactive seed guided partial mastectomy and sentinel lymph node biopsy  Procedure: The patient already had radioactive isotope injected around the areola of the right breast in the preoperative area.  She was then taken to the operating room.  She is placed upon the operating table general anesthesia was induced.  Her right breast and axilla were then prepped and draped in usual sterile fashion.  The radioactive seed was located at the 8 o'clock position 9 cm from the nipple.  With the aid of the neoprobe identify the area.  I then anesthetized skin with Marcaine.  I made incision with a scalpel in the outer quadrant of the breast.  I then used the neoprobe to dissect down around the radioactive seed.  I stabilized around the seed with aid of the neoprobe going down to the chest wall and completed the partial mastectomy removing the breast tissue.  Once the partial mastectomy specimen was removed, I marked all margins with marker paint.  An x-ray was performed of the specimen confirming the radioactive seed and previous marker in the specimen before was sent to pathology for evaluation.  I then achieved hemostasis with cautery.  I then used the neoprobe to identify an area of increased uptake in the right axilla.  I anesthetized skin with Marcaine made incision in the axilla with a  scalpel.  I then dissected down to the deep axillary tissue and excised several sentinel lymph nodes together with the cautery.  There were no large palpable nodes in the axillary basin after this.  There was no other uptake of radioactive isotope as well.  I anesthetized this wound for the Marcaine achieved hemostasis with cautery.  I then closed both incisions with interrupted 3-0 Vicryl sutures and running 4-0 Monocryl sutures.  Dermabond was then applied.  The patient tolerated procedure well.  All the counts were correct at the end of the procedure.  The patient was then extubated in the operating room and taken in a stable condition to the recovery room.          Coralie Keens   Date: 06/24/2019  Time: 3:35 PM

## 2019-06-24 NOTE — Anesthesia Procedure Notes (Signed)
Anesthesia Regional Block: Pectoralis block   Pre-Anesthetic Checklist: ,, timeout performed, Correct Patient, Correct Site, Correct Laterality, Correct Procedure, Correct Position, site marked, Risks and benefits discussed,  Surgical consent,  Pre-op evaluation,  At surgeon's request and post-op pain management  Laterality: Right  Prep: chloraprep       Needles:  Injection technique: Single-shot  Needle Type: Echogenic Needle     Needle Length: 9cm  Needle Gauge: 21     Additional Needles:   Procedures:,,,, ultrasound used (permanent image in chart),,,,  Narrative:  Start time: 06/24/2019 1:58 PM End time: 06/24/2019 2:05 PM Injection made incrementally with aspirations every 5 mL.  Performed by: Personally  Anesthesiologist: Annye Asa, MD  Additional Notes: Pt identified in Holding room.  Monitors applied. Working IV access confirmed. Sterile prep, drape R clavicle and pec.  #21ga ECHOgenic needle between pec minor and serratus, between ribs 4,5 with US guidance.  30cc 0.5% Bupivacaine with 1:200k epi injected incrementally after negative test dose. Good fascial spread of local. Patient asymptomatic, VSS, no heme aspirated, tolerated well.  Jenita Seashore, MD

## 2019-06-24 NOTE — Assessment & Plan Note (Addendum)
06/12/2019:Screening mammogram detected right breast asymmetry. Diagnostic mammogram and US showed 0.5cm right breast mass. Biopsy showed IDC, grade 1, HER-2 - (0), ER+ 100%, PR+ 70%, Ki67 5%. T1 a N0 stage Ia  06/16/2019: Right lumpectomy:Right lumpectomy Ninfa Linden): IDC with intermediate grade DCIS, 0.6cm, grade 2, lymphovascular space invasion present, clear margins, and one right axillary lymph node negative. grade 1, HER-2 - (0), ER+ 100%, PR+ 70%, Ki67 5%. T1BN0 stage Ia  Pathology counseling: I discussed the final pathology report of the patient provided  a copy of this report. I discussed the margins as well as lymph node surgeries. We also discussed the final staging along with previously performed ER/PR and HER-2/neu testing.  Recommendation: Adjuvant antiestrogen therapy with anastrozole 1 mg daily to be started in 2 weeks time.  Return to clinic in 3 months for survivorship care plan visit

## 2019-06-24 NOTE — Discharge Instructions (Signed)
[place] Office Phone Number [phone]  BREAST BIOPSY/ PARTIAL MASTECTOMY: POST OP INSTRUCTIONS  Always review your discharge instruction sheet given to you by the facility where your surgery was performed.  IF YOU HAVE DISABILITY OR FAMILY LEAVE FORMS, YOU MUST BRING THEM TO THE OFFICE FOR PROCESSING.  DO NOT GIVE THEM TO YOUR DOCTOR.  1. A prescription for pain medication may be given to you upon discharge.  Take your pain medication as prescribed, if needed.  If narcotic pain medicine is not needed, then you may take acetaminophen (Tylenol) or ibuprofen (Advil) as needed. 2. Take your usually prescribed medications unless otherwise directed 3. If you need a refill on your pain medication, please contact your pharmacy.  They will contact our office to request authorization.  Prescriptions will not be filled after 5pm or on week-ends. 4. You should eat very light the first 24 hours after surgery, such as soup, crackers, pudding, etc.  Resume your normal diet the day after surgery. 5. Most patients will experience some swelling and bruising in the breast.  Ice packs and a good support bra will help.  Swelling and bruising can take several days to resolve.  6. It is common to experience some constipation if taking pain medication after surgery.  Increasing fluid intake and taking a stool softener will usually help or prevent this problem from occurring.  A mild laxative (Milk of Magnesia or Miralax) should be taken according to package directions if there are no bowel movements after 48 hours. 7. Unless discharge instructions indicate otherwise, you may remove your bandages 24-48 hours after surgery, and you may shower at that time.  You may have steri-strips (small skin tapes) in place directly over the incision.  These strips should be left on the skin for 7-10 days.  If your surgeon used skin glue on the incision, you may shower in 24 hours.  The glue will flake off over the  next 2-3 weeks.  Any sutures or staples will be removed at the office during your follow-up visit. 8. ACTIVITIES:  You may resume regular daily activities (gradually increasing) beginning the next day.  Wearing a good support bra or sports bra minimizes pain and swelling.  You may have sexual intercourse when it is comfortable. a. You may drive when you no longer are taking prescription pain medication, you can comfortably wear a seatbelt, and you can safely maneuver your car and apply brakes. b. RETURN TO WORK:  ______________________________________________________________________________________ 9. You should see your doctor in the office for a follow-up appointment approximately two weeks after your surgery.  Your doctors nurse will typically make your follow-up appointment when she calls you with your pathology report.  Expect your pathology report 2-3 business days after your surgery.  You may call to check if you do not hear from [place] after three days. 10. OTHER INSTRUCTIONS:OK TO SHOWER STARTING TOMORROW 11. ICE PACK, TYLENOL, IBUPROFEN ALSO FOR PAIN 12. NO VIGOROUS ACTIVITY FOR 1 WEEKS 13.  _______________________________________________________________________________________________ _____________________________________________________________________________________________________________________________________ _____________________________________________________________________________________________________________________________________ _____________________________________________________________________________________________________________________________________  WHEN TO CALL YOUR DOCTOR: 1. Fever over 101.0 2. Nausea and/or vomiting. 3. Extreme swelling or bruising. 4. Continued bleeding from incision. 5. Increased pain, redness, or drainage from the incision.  The clinic staff is available to answer your questions during regular business hours.  Please dont hesitate to  call and ask to speak to one of the nurses for clinical concerns.  If you have a medical emergency, go to the nearest emergency room or call 911.  A surgeon from [facility] is always on call at the hospital.  For further questions, please visit [email or web address]

## 2019-06-25 ENCOUNTER — Encounter (HOSPITAL_COMMUNITY): Payer: Self-pay | Admitting: Surgery

## 2019-06-25 DIAGNOSIS — M6281 Muscle weakness (generalized): Secondary | ICD-10-CM | POA: Diagnosis not present

## 2019-06-25 DIAGNOSIS — I471 Supraventricular tachycardia: Secondary | ICD-10-CM | POA: Diagnosis not present

## 2019-06-25 DIAGNOSIS — Z483 Aftercare following surgery for neoplasm: Secondary | ICD-10-CM | POA: Diagnosis not present

## 2019-06-25 DIAGNOSIS — Z170 Estrogen receptor positive status [ER+]: Secondary | ICD-10-CM | POA: Diagnosis not present

## 2019-06-25 DIAGNOSIS — Z4801 Encounter for change or removal of surgical wound dressing: Secondary | ICD-10-CM | POA: Diagnosis not present

## 2019-06-25 DIAGNOSIS — I10 Essential (primary) hypertension: Secondary | ICD-10-CM | POA: Diagnosis not present

## 2019-06-25 DIAGNOSIS — M47816 Spondylosis without myelopathy or radiculopathy, lumbar region: Secondary | ICD-10-CM | POA: Diagnosis not present

## 2019-06-25 DIAGNOSIS — C50311 Malignant neoplasm of lower-inner quadrant of right female breast: Secondary | ICD-10-CM | POA: Diagnosis not present

## 2019-06-25 DIAGNOSIS — C779 Secondary and unspecified malignant neoplasm of lymph node, unspecified: Secondary | ICD-10-CM | POA: Diagnosis not present

## 2019-06-25 DIAGNOSIS — M4316 Spondylolisthesis, lumbar region: Secondary | ICD-10-CM | POA: Diagnosis not present

## 2019-06-25 NOTE — Anesthesia Postprocedure Evaluation (Signed)
Anesthesia Post Note  Patient: Miranda [name]  Procedure(s) Performed: RADIOACTIVE SEED GUIDED RIGHT BREAST PARTIAL MASTECTOMY WITH  SENTINEL LYMPH NODE BIOPSY (Right Breast)     Patient location during evaluation: PACU Anesthesia Type: General and Regional Level of consciousness: awake and alert, oriented and patient cooperative Pain management: pain level controlled Vital Signs Assessment: post-procedure vital signs reviewed and stable Respiratory status: spontaneous breathing, nonlabored ventilation and respiratory function stable Cardiovascular status: blood pressure returned to baseline and stable Postop Assessment: no apparent nausea or vomiting Anesthetic complications: no    Last Vitals:  Vitals:   06/24/19 1559 06/24/19 1600  BP: (!) 150/82   Pulse: 82 72  Resp: 15 16  Temp: 36.4 C   SpO2: 100%     Last Pain:  Vitals:   06/24/19 1559  TempSrc:   PainSc: 0-No pain                 Marzell Isakson,E. [name]

## 2019-06-26 DIAGNOSIS — Z170 Estrogen receptor positive status [ER+]: Secondary | ICD-10-CM | POA: Diagnosis not present

## 2019-06-26 DIAGNOSIS — C779 Secondary and unspecified malignant neoplasm of lymph node, unspecified: Secondary | ICD-10-CM | POA: Diagnosis not present

## 2019-06-26 DIAGNOSIS — Z4801 Encounter for change or removal of surgical wound dressing: Secondary | ICD-10-CM | POA: Diagnosis not present

## 2019-06-26 DIAGNOSIS — M4316 Spondylolisthesis, lumbar region: Secondary | ICD-10-CM | POA: Diagnosis not present

## 2019-06-26 DIAGNOSIS — Z483 Aftercare following surgery for neoplasm: Secondary | ICD-10-CM | POA: Diagnosis not present

## 2019-06-26 DIAGNOSIS — C50311 Malignant neoplasm of lower-inner quadrant of right female breast: Secondary | ICD-10-CM | POA: Diagnosis not present

## 2019-06-27 ENCOUNTER — Other Ambulatory Visit: Payer: Self-pay

## 2019-06-27 DIAGNOSIS — C779 Secondary and unspecified malignant neoplasm of lymph node, unspecified: Secondary | ICD-10-CM | POA: Diagnosis not present

## 2019-06-27 DIAGNOSIS — Z4801 Encounter for change or removal of surgical wound dressing: Secondary | ICD-10-CM | POA: Diagnosis not present

## 2019-06-27 DIAGNOSIS — Z170 Estrogen receptor positive status [ER+]: Secondary | ICD-10-CM | POA: Diagnosis not present

## 2019-06-27 DIAGNOSIS — Z483 Aftercare following surgery for neoplasm: Secondary | ICD-10-CM | POA: Diagnosis not present

## 2019-06-27 DIAGNOSIS — M4316 Spondylolisthesis, lumbar region: Secondary | ICD-10-CM | POA: Diagnosis not present

## 2019-06-27 DIAGNOSIS — C50311 Malignant neoplasm of lower-inner quadrant of right female breast: Secondary | ICD-10-CM | POA: Diagnosis not present

## 2019-06-30 ENCOUNTER — Telehealth: Payer: Self-pay | Admitting: Hematology and Oncology

## 2019-06-30 NOTE — Progress Notes (Signed)
HEMATOLOGY-ONCOLOGY DOXIMITY VISIT PROGRESS NOTE  I connected with Miranda [name] on 07/01/2019 at  2:15 PM EDT by Doximity video conference and verified that I am speaking with the correct person using two identifiers.  I discussed the limitations, risks, security and privacy concerns of performing an evaluation and management service by Doximity and the availability of in person appointments.  I also discussed with the patient that there may be a patient responsible charge related to this service. The patient expressed understanding and agreed to proceed.  Patient's Location: Home Physician Location: Clinic  CHIEF COMPLIANT: Follow-up s/p lumpectomy to review pathology   INTERVAL HISTORY: Miranda [name] is a 85 y.o. female with above-mentioned history of right breast cancer. She underwent a lumpectomy on 06/24/19 with Dr. Ninfa Linden for which pathology showed 0.6cm grade 2 invasive ductal carcinoma with intermediate grade DCIS, lymphovascular space invasion present, clear margins, and one right axillary lymph node negative for carcinoma. She presents over Doximity today to discuss the pathology report and further treatment.   Oncology History  Malignant neoplasm of lower-inner quadrant of right breast of female, estrogen receptor positive ([place])  06/12/2019 Initial Diagnosis   Screening mammogram detected right breast asymmetry. Diagnostic mammogram and US showed 0.5cm right breast mass. Biopsy showed IDC, grade 1, HER-2 - (0), ER+ 100%, PR+ 70%, Ki67 5%.    06/12/2019 Cancer Staging   Staging form: Breast, AJCC 8th Edition - Clinical stage from 06/12/2019: Stage IA (cT1a, cN0, cM0, G2, ER+, PR+, HER2-) - Signed by Nicholas Lose, MD on 06/17/2019   06/24/2019 Surgery   Right lumpectomy Ninfa Linden): IDC with intermediate grade DCIS, 0.6cm, grade 2, lymphovascular space invasion present, clear margins, and one right axillary lymph node negative.     REVIEW OF SYSTEMS:   Constitutional: Denies  fevers, chills or abnormal weight loss Eyes: Denies blurriness of vision Ears, nose, mouth, throat, and face: Denies mucositis or sore throat Respiratory: Denies cough, dyspnea or wheezes Cardiovascular: Denies palpitation, chest discomfort Gastrointestinal:  Denies nausea, heartburn or change in bowel habits Skin: Denies abnormal skin rashes Lymphatics: Denies new lymphadenopathy or easy bruising Neurological:Denies numbness, tingling or new weaknesses Behavioral/Psych: Mood is stable, no new changes  Extremities: No lower extremity edema Breast: recent Rt Lumpectomy All other systems were reviewed with the patient and are negative.  Observations/Objective:  There were no vitals filed for this visit. There is no height or weight on file to calculate BMI.  I have reviewed the data as listed CMP Latest Ref Rng & Units 06/19/2019 03/22/2017 07/09/2014  Glucose 70 - 99 mg/dL 128(H) 167(H) -  BUN 8 - 23 mg/dL 12 11 -  Creatinine 0.44 - 1.00 mg/dL 0.97 0.86 1.00  Sodium 135 - 145 mmol/L 138 140 -  Potassium 3.5 - 5.1 mmol/L 3.0(L) 2.9(L) -  Chloride 98 - 111 mmol/L 98 99(L) -  CO2 22 - 32 mmol/L 29 31 -  Calcium 8.9 - 10.3 mg/dL 9.0 8.8(L) -  Total Protein 6.5 - 8.1 g/dL - 7.3 -  Total Bilirubin 0.3 - 1.2 mg/dL - 1.0 -  Alkaline Phos 38 - 126 U/L - 71 -  AST 15 - 41 U/L - 25 -  ALT 14 - 54 U/L - 26 -    Lab Results  Component Value Date   WBC 5.8 06/19/2019   HGB 13.4 06/19/2019   HCT 42.0 06/19/2019   MCV 83.5 06/19/2019   PLT 232 06/19/2019   NEUTROABS 2.7 12/19/2008      Assessment  Plan:  Malignant neoplasm of lower-inner quadrant of right breast of female, estrogen receptor positive ([place]) 06/12/2019:Screening mammogram detected right breast asymmetry. Diagnostic mammogram and US showed 0.5cm right breast mass. Biopsy showed IDC, grade 1, HER-2 - (0), ER+ 100%, PR+ 70%, Ki67 5%. T1 a N0 stage Ia  06/16/2019: Right lumpectomy:Right lumpectomy Ninfa Linden): IDC with  intermediate grade DCIS, 0.6cm, grade 2, lymphovascular space invasion present, clear margins, and one right axillary lymph node negative. grade 1, HER-2 - (0), ER+ 100%, PR+ 70%, Ki67 5%. T1BN0 stage Ia  Pathology counseling: I discussed the final pathology report of the patient provided  a copy of this report. I discussed the margins as well as lymph node surgeries. We also discussed the final staging along with previously performed ER/PR and HER-2/neu testing.  Recommendation: Adjuvant antiestrogen therapy with anastrozole 1 mg daily to be started in 2 weeks time. Anastrozole counseling: We discussed the risks and benefits of anti-estrogen therapy with aromatase inhibitors. These include but not limited to insomnia, hot flashes, mood changes, vaginal dryness, bone density loss, and weight gain. We strongly believe that the benefits far outweigh the risks. Patient understands these risks and consented to starting treatment. Planned treatment duration is 5 years.  Return to clinic in 3 months for survivorship care plan visit  I discussed the assessment and treatment plan with the patient. The patient was provided an opportunity to ask questions and all were answered. The patient agreed with the plan and demonstrated an understanding of the instructions. The patient was advised to call back or seek an in-person evaluation if the symptoms worsen or if the condition fails to improve as anticipated.   I provided 15 minutes of face-to-face Doximity time during this encounter.    Rulon Eisenmenger, MD 07/01/2019   I, Molly Dorshimer, am acting as scribe for Nicholas Lose, MD.  I have reviewed the above documentation for accuracy and completeness, and I agree with the above.

## 2019-07-01 ENCOUNTER — Inpatient Hospital Stay: Payer: Medicare Other | Attending: Hematology and Oncology | Admitting: Hematology and Oncology

## 2019-07-01 DIAGNOSIS — C50311 Malignant neoplasm of lower-inner quadrant of right female breast: Secondary | ICD-10-CM

## 2019-07-01 DIAGNOSIS — Z170 Estrogen receptor positive status [ER+]: Secondary | ICD-10-CM | POA: Diagnosis not present

## 2019-07-01 MED ORDER — ANASTROZOLE 1 MG PO TABS: 1.0000 mg | ORAL_TABLET | Freq: Every day | ORAL | 3 refills | Status: DC

## 2019-07-02 ENCOUNTER — Encounter: Payer: Self-pay | Admitting: *Deleted

## 2019-07-02 ENCOUNTER — Telehealth: Payer: Self-pay | Admitting: Hematology and Oncology

## 2019-07-02 DIAGNOSIS — Z483 Aftercare following surgery for neoplasm: Secondary | ICD-10-CM | POA: Diagnosis not present

## 2019-07-02 DIAGNOSIS — Z4801 Encounter for change or removal of surgical wound dressing: Secondary | ICD-10-CM | POA: Diagnosis not present

## 2019-07-02 DIAGNOSIS — C50311 Malignant neoplasm of lower-inner quadrant of right female breast: Secondary | ICD-10-CM | POA: Diagnosis not present

## 2019-07-02 DIAGNOSIS — C779 Secondary and unspecified malignant neoplasm of lymph node, unspecified: Secondary | ICD-10-CM | POA: Diagnosis not present

## 2019-07-02 DIAGNOSIS — M4316 Spondylolisthesis, lumbar region: Secondary | ICD-10-CM | POA: Diagnosis not present

## 2019-07-02 DIAGNOSIS — Z170 Estrogen receptor positive status [ER+]: Secondary | ICD-10-CM | POA: Diagnosis not present

## 2019-07-02 NOTE — Telephone Encounter (Signed)
I talk with patient regarding schedule  

## 2019-07-09 DIAGNOSIS — C50311 Malignant neoplasm of lower-inner quadrant of right female breast: Secondary | ICD-10-CM | POA: Diagnosis not present

## 2019-07-09 DIAGNOSIS — Z170 Estrogen receptor positive status [ER+]: Secondary | ICD-10-CM | POA: Diagnosis not present

## 2019-07-09 DIAGNOSIS — C779 Secondary and unspecified malignant neoplasm of lymph node, unspecified: Secondary | ICD-10-CM | POA: Diagnosis not present

## 2019-07-09 DIAGNOSIS — Z483 Aftercare following surgery for neoplasm: Secondary | ICD-10-CM | POA: Diagnosis not present

## 2019-07-09 DIAGNOSIS — Z4801 Encounter for change or removal of surgical wound dressing: Secondary | ICD-10-CM | POA: Diagnosis not present

## 2019-07-09 DIAGNOSIS — M4316 Spondylolisthesis, lumbar region: Secondary | ICD-10-CM | POA: Diagnosis not present

## 2019-07-25 DIAGNOSIS — M4316 Spondylolisthesis, lumbar region: Secondary | ICD-10-CM | POA: Diagnosis not present

## 2019-07-25 DIAGNOSIS — M47816 Spondylosis without myelopathy or radiculopathy, lumbar region: Secondary | ICD-10-CM | POA: Diagnosis not present

## 2019-07-25 DIAGNOSIS — C50311 Malignant neoplasm of lower-inner quadrant of right female breast: Secondary | ICD-10-CM | POA: Diagnosis not present

## 2019-07-25 DIAGNOSIS — Z483 Aftercare following surgery for neoplasm: Secondary | ICD-10-CM | POA: Diagnosis not present

## 2019-07-25 DIAGNOSIS — Z4801 Encounter for change or removal of surgical wound dressing: Secondary | ICD-10-CM | POA: Diagnosis not present

## 2019-07-25 DIAGNOSIS — M6281 Muscle weakness (generalized): Secondary | ICD-10-CM | POA: Diagnosis not present

## 2019-07-25 DIAGNOSIS — Z170 Estrogen receptor positive status [ER+]: Secondary | ICD-10-CM | POA: Diagnosis not present

## 2019-07-25 DIAGNOSIS — I10 Essential (primary) hypertension: Secondary | ICD-10-CM | POA: Diagnosis not present

## 2019-07-25 DIAGNOSIS — C779 Secondary and unspecified malignant neoplasm of lymph node, unspecified: Secondary | ICD-10-CM | POA: Diagnosis not present

## 2019-07-25 DIAGNOSIS — I471 Supraventricular tachycardia: Secondary | ICD-10-CM | POA: Diagnosis not present

## 2019-09-08 DIAGNOSIS — Z23 Encounter for immunization: Secondary | ICD-10-CM | POA: Diagnosis not present

## 2019-09-15 DIAGNOSIS — H402231 Chronic angle-closure glaucoma, bilateral, mild stage: Secondary | ICD-10-CM | POA: Diagnosis not present

## 2019-09-15 DIAGNOSIS — H04123 Dry eye syndrome of bilateral lacrimal glands: Secondary | ICD-10-CM | POA: Diagnosis not present

## 2019-10-09 ENCOUNTER — Inpatient Hospital Stay: Payer: Medicare Other | Attending: Hematology and Oncology | Admitting: Adult Health

## 2019-10-09 ENCOUNTER — Encounter: Payer: Self-pay | Admitting: Adult Health

## 2019-10-09 ENCOUNTER — Other Ambulatory Visit: Payer: Self-pay

## 2019-10-09 VITALS — BP 146/65 | HR 95 | Temp 98.5°F | Resp 18 | Ht 61.0 in | Wt 165.2 lb

## 2019-10-09 DIAGNOSIS — E782 Mixed hyperlipidemia: Secondary | ICD-10-CM | POA: Insufficient documentation

## 2019-10-09 DIAGNOSIS — E2839 Other primary ovarian failure: Secondary | ICD-10-CM

## 2019-10-09 DIAGNOSIS — Z85118 Personal history of other malignant neoplasm of bronchus and lung: Secondary | ICD-10-CM | POA: Diagnosis not present

## 2019-10-09 DIAGNOSIS — I10 Essential (primary) hypertension: Secondary | ICD-10-CM | POA: Diagnosis not present

## 2019-10-09 DIAGNOSIS — C50311 Malignant neoplasm of lower-inner quadrant of right female breast: Secondary | ICD-10-CM | POA: Diagnosis not present

## 2019-10-09 DIAGNOSIS — Z170 Estrogen receptor positive status [ER+]: Secondary | ICD-10-CM | POA: Diagnosis not present

## 2019-10-09 DIAGNOSIS — Z79811 Long term (current) use of aromatase inhibitors: Secondary | ICD-10-CM | POA: Diagnosis not present

## 2019-10-09 DIAGNOSIS — Z79899 Other long term (current) drug therapy: Secondary | ICD-10-CM | POA: Diagnosis not present

## 2019-10-09 DIAGNOSIS — Z7982 Long term (current) use of aspirin: Secondary | ICD-10-CM | POA: Diagnosis not present

## 2019-10-09 NOTE — Progress Notes (Signed)
CLINIC:  Survivorship   REASON FOR VISIT:  Routine follow-up post-treatment for a recent history of breast cancer.  BRIEF ONCOLOGIC HISTORY:  Oncology History  Malignant neoplasm of lower-inner quadrant of right breast of female, estrogen receptor positive ([place])  06/12/2019 Initial Diagnosis   Screening mammogram detected right breast asymmetry. Diagnostic mammogram and US showed 0.5cm right breast mass. Biopsy showed IDC, grade 1, HER-2 - (0), ER+ 100%, PR+ 70%, Ki67 5%.    06/12/2019 Cancer Staging   Staging form: Breast, AJCC 8th Edition - Clinical stage from 06/12/2019: Stage IA (cT1a, cN0, cM0, G2, ER+, PR+, HER2-) - Signed by Nicholas Lose, MD on 06/17/2019   06/24/2019 Surgery   Right lumpectomy Ninfa Linden): IDC with intermediate grade DCIS, 0.6cm, grade 2, lymphovascular space invasion present, clear margins, and one right axillary lymph node negative.    06/24/2019 Cancer Staging   Staging form: Breast, AJCC 8th Edition - Pathologic stage from 06/24/2019: Stage IA (pT1a, pN0, cM0, G2, ER+, PR+, HER2-) - Signed by Gardenia Phlegm, NP on 10/01/2019   06/2019 -  Anti-estrogen oral therapy   Anastrozole daily     INTERVAL HISTORY:  Ms. Lapka presents to [facility] today for our initial meeting to review her survivorship care plan detailing her treatment course for breast cancer, as well as monitoring long-term side effects of that treatment, education regarding health maintenance, screening, and overall wellness and health promotion.     Overall, Ms. Ranes reports feeling quite well.  She is taking Anastrozole daily.  She is tolerating this well.  She denies vaginal dryness, hot flashes, joint aches/pains.      REVIEW OF SYSTEMS:  Review of Systems  Constitutional: Negative for appetite change, chills, fatigue and unexpected weight change.  HENT:   Negative for hearing loss, lump/mass, sore throat and trouble swallowing.   Eyes: Negative for eye problems and  icterus.  Respiratory: Negative for chest tightness, cough and shortness of breath.   Cardiovascular: Negative for chest pain, leg swelling and palpitations.  Gastrointestinal: Negative for abdominal distention, abdominal pain, constipation, diarrhea, nausea and vomiting.  Endocrine: Negative for hot flashes.  Musculoskeletal: Negative for arthralgias.  Skin: Negative for itching and rash.  Neurological: Negative for dizziness, extremity weakness, headaches and numbness.  Hematological: Negative for adenopathy. Does not bruise/bleed easily.  Psychiatric/Behavioral: Negative for depression. The patient is not nervous/anxious.    Breast: Denies any new nodularity, masses, tenderness, nipple changes, or nipple discharge.      ONCOLOGY TREATMENT TEAM:  1. Surgeon:  Dr. Ninfa Linden at [facility] 2. Medical Oncologist: Dr. Lindi Adie      PAST MEDICAL/SURGICAL HISTORY:  Past Medical History:  Diagnosis Date  . Asthma   . Cancer ([place])   . Essential hypertension, benign   . Mixed hyperlipidemia   . Nephrolithiasis    Past Surgical History:  Procedure Laterality Date  . ABDOMINAL HYSTERECTOMY     excessive bleeding, no cancer   . Left knee arthroscopy due to torn ligament and cartiallege  2006   [facility]   . Left lung - lobectomy lower lobe - lung cancer    . RADIOACTIVE SEED GUIDED PARTIAL MASTECTOMY WITH AXILLARY SENTINEL LYMPH NODE BIOPSY Right 06/24/2019   Procedure: RADIOACTIVE SEED GUIDED RIGHT BREAST PARTIAL MASTECTOMY WITH  SENTINEL LYMPH NODE BIOPSY;  Surgeon: Coralie Keens, MD;  Location: [place];  Service: General;  Laterality: Right;     ALLERGIES:  Allergies  Allergen Reactions  . Bee Venom Swelling  CURRENT MEDICATIONS:  Outpatient Encounter Medications as of 10/09/2019  Medication Sig  . acetaminophen (TYLENOL) 500 MG tablet Take 500 mg by mouth every 6 (six) hours as needed for moderate pain or headache.  . anastrozole (ARIMIDEX) 1 MG tablet  Take 1 tablet (1 mg total) by mouth daily.  Marland Kitchen aspirin (ASPIRIN LOW DOSE) 81 MG EC tablet Take 81 mg by mouth daily.    . cetirizine (ZYRTEC) 10 MG tablet Take 10 mg by mouth daily as needed for allergies.  . fluocinonide cream (LIDEX) 7.41 % Apply 1 application topically See admin instructions. Apply topically twice a day for 2 weeks, then off 1 week. Repeat once  . fluticasone (FLONASE) 50 MCG/ACT nasal spray Place 1 spray into both nostrils daily as needed for allergies or rhinitis.  Marland Kitchen KLOR-CON M20 20 MEQ tablet Take 20 mEq by mouth daily.   Marland Kitchen latanoprost (XALATAN) 0.005 % ophthalmic solution Place 1 drop into both eyes at bedtime.  . metoprolol tartrate (LOPRESSOR) 25 MG tablet Take 25 mg by mouth daily as needed (rapid heart rate).   . Polyvinyl Alcohol-Povidone (REFRESH OP) Place 1 drop into both eyes daily as needed (dry eyes).  Marland Kitchen telmisartan-hydrochlorothiazide (MICARDIS HCT) 80-25 MG tablet Take 1 tablet by mouth daily.  Marland Kitchen torsemide (DEMADEX) 20 MG tablet Take 20 mg by mouth daily as needed (swelling).  . traMADol (ULTRAM) 50 MG tablet Take 1 tablet (50 mg total) by mouth every 6 (six) hours as needed.   No facility-administered encounter medications on file as of 10/09/2019.      ONCOLOGIC FAMILY HISTORY:  Family History  Problem Relation Age of Onset  . Colon cancer Father   . Heart attack Mother   . Anuerysm Mother        Brain   . Stroke Sister   . Hypertension Sister   . Melanoma Sister   . Hypertension Sister   . Hypertension Son   . Cancer Other        Family history   . Arthritis Other        Family history      GENETIC COUNSELING/TESTING: Not at this time  SOCIAL HISTORY:  Social History   Socioeconomic History  . Marital status: Divorced    Spouse name: Not on file  . Number of children: 2  . Years of education: college  . Highest education level: Not on file  Occupational History  . Occupation: Public relations account executive for [employer]   . Financial resource strain: Not on file  . Food insecurity    Worry: Not on file    Inability: Not on file  . Transportation needs    Medical: No    Non-medical: No  Tobacco Use  . Smoking status: Never Smoker  . Smokeless tobacco: Never Used  Substance and Sexual Activity  . Alcohol use: Yes    Comment: occasional  . Drug use: No  . Sexual activity: Not on file  Lifestyle  . Physical activity    Days per week: Not on file    Minutes per session: Not on file  . Stress: Not on file  Relationships  . Social Herbalist on phone: Not on file    Gets together: Not on file    Attends religious service: Not on file    Active member of club or organization: Not on file    Attends meetings of clubs or organizations: Not on file  Relationship status: Not on file  . Intimate partner violence    Fear of current or ex partner: Not on file    Emotionally abused: Not on file    Physically abused: Not on file    Forced sexual activity: Not on file  Other Topics Concern  . Not on file  Social History Narrative  . Not on file      PHYSICAL EXAMINATION:  Vital Signs:   Vitals:   10/09/19 1351  BP: (!) 146/65  Pulse: 95  Resp: 18  Temp: 98.5 F (36.9 C)  SpO2: 97%   Filed Weights   10/09/19 1351  Weight: 165 lb 3.2 oz (74.9 kg)   General: Well-nourished, well-appearing female in no acute distress.  She is unaccompanied today.   HEENT: Head is normocephalic.  Pupils equal and reactive to light. Conjunctivae clear without exudate.  Sclerae anicteric. Oral mucosa is pink, moist.  Oropharynx is pink without lesions or erythema.  Lymph: No cervical, supraclavicular, or infraclavicular lymphadenopathy noted on palpation.  Cardiovascular: Regular rate and rhythm.Marland Kitchen Respiratory: Clear to auscultation bilaterally. Chest expansion symmetric; breathing non-labored.  GI: Abdomen soft and round; non-tender, non-distended. Bowel sounds normoactive.  Breasts: right breast  s/p lumpectomy, no sign of recurrence, left breast benign.   GU: Deferred.  Neuro: No focal deficits. Steady gait.  Psych: Mood and affect normal and appropriate for situation.  Extremities: No edema. MSK: No focal spinal tenderness to palpation.  Full range of motion in bilateral upper extremities Skin: Warm and dry.  LABORATORY DATA:  None for this visit.  DIAGNOSTIC IMAGING:  None for this visit.      ASSESSMENT AND PLAN:  Ms.. Goens is a pleasant 85 y.o. female with Stage IA right breast invasive ductal carcinoma, ER+/PR+/HER2-, diagnosed in 05/2019, treated with lumpectomy, and anti-estrogen therapy with Anastrozole beginning in 06/2019.  She presents to the Survivorship Clinic for our initial meeting and routine follow-up post-completion of treatment for breast cancer.    1. Stage IA right breast cancer:  Ms. Strandberg is continuing to recover from definitive treatment for breast cancer. She will follow-up with her medical oncologist, Dr. Lindi Adie in 6 months with history and physical exam per surveillance protocol.  She will continue her anti-estrogen therapy with Anastrozole. Thus far, she is tolerating the Anastrozole well, with minimal side effects. She was instructed to make Dr. Lindi Adie or myself aware if she begins to experience any worsening side effects of the medication and I could see her back in clinic to help manage those side effects, as needed. TToday, a comprehensive survivorship care plan and treatment summary was reviewed with the patient today detailing her breast cancer diagnosis, treatment course, potential late/long-term effects of treatment, appropriate follow-up care with recommendations for the future, and patient education resources.  A copy of this summary, along with a letter will be sent to the patient's primary care provider via mail/fax/In Basket message after today's visit.    2. Bone health:  Given Ms. Guier's age/history of breast cancer and her current treatment  regimen including anti-estrogen therapy with Anastrozole, she is at risk for bone demineralization.  Her last DEXA scan was in 2015 and was normal.  In 05/2020 she will have this repeated when she has her mammogram.  In the meantime, she was encouraged to increase her consumption of foods rich in calcium, as well as increase her weight-bearing activities.  She was given education on specific activities to promote bone health.  3. Cancer screening:  Due to Ms. Sargent's history and her age, she should receive screening for skin cancers, colon cancer, and gynecologic cancers.  The information and recommendations are listed on the patient's comprehensive care plan/treatment summary and were reviewed in detail with the patient.    4. Health maintenance and wellness promotion: Ms. Heaps was encouraged to consume 5-7 servings of fruits and vegetables per day. We reviewed the "Nutrition Rainbow" handout, as well as the handout "Take Control of Your Health and Reduce Your Cancer Risk" from the [employer].  She was also encouraged to engage in moderate to vigorous exercise for 30 minutes per day most days of the week. We discussed the [employer] fitness program, which is designed for cancer survivors to help them become more physically fit after cancer treatments.  She was instructed to limit her alcohol consumption and continue to abstain from tobacco use.    5. Support services/counseling: It is not uncommon for this period of the patient's cancer care trajectory to be one of many emotions and stressors.  We discussed that though our support services is mainly virtual there are many ways to reach our support team.  I gave her a card detailing this.    Dispo:   -Return to cancer center in 6 months for f/u with Dr. Lindi Adie  -Mammogram due in 05/2020 -Bone density in 05/2020 -Follow up with surgery 11/2019 -She is welcome to return back to the Survivorship Clinic at any time; no additional follow-up  needed at this time.  -Consider referral back to survivorship as a long-term survivor for continued surveillance  A total of (20) minutes of face-to-face time was spent with this patient with greater than 50% of that time in counseling and care-coordination.   Gardenia Phlegm, [place] [phone]   Note: PRIMARY CARE PROVIDER Celene Squibb, MD [phone]  s

## 2019-10-10 ENCOUNTER — Telehealth: Payer: Self-pay | Admitting: Hematology and Oncology

## 2019-10-10 NOTE — Telephone Encounter (Signed)
I talk with patient regarding schedule  

## 2019-11-11 ENCOUNTER — Other Ambulatory Visit: Payer: Self-pay | Admitting: Orthopedic Surgery

## 2019-11-11 DIAGNOSIS — M1711 Unilateral primary osteoarthritis, right knee: Secondary | ICD-10-CM

## 2019-11-11 DIAGNOSIS — M47816 Spondylosis without myelopathy or radiculopathy, lumbar region: Secondary | ICD-10-CM

## 2019-11-11 DIAGNOSIS — M4316 Spondylolisthesis, lumbar region: Secondary | ICD-10-CM

## 2019-11-11 DIAGNOSIS — M79604 Pain in right leg: Secondary | ICD-10-CM

## 2019-12-05 DIAGNOSIS — Z85118 Personal history of other malignant neoplasm of bronchus and lung: Secondary | ICD-10-CM | POA: Diagnosis not present

## 2019-12-05 DIAGNOSIS — E782 Mixed hyperlipidemia: Secondary | ICD-10-CM | POA: Diagnosis not present

## 2019-12-05 DIAGNOSIS — R7303 Prediabetes: Secondary | ICD-10-CM | POA: Diagnosis not present

## 2019-12-05 DIAGNOSIS — Z853 Personal history of malignant neoplasm of breast: Secondary | ICD-10-CM | POA: Diagnosis not present

## 2019-12-05 DIAGNOSIS — R002 Palpitations: Secondary | ICD-10-CM | POA: Diagnosis not present

## 2019-12-05 DIAGNOSIS — E876 Hypokalemia: Secondary | ICD-10-CM | POA: Diagnosis not present

## 2019-12-05 DIAGNOSIS — R7301 Impaired fasting glucose: Secondary | ICD-10-CM | POA: Diagnosis not present

## 2019-12-05 DIAGNOSIS — Z0001 Encounter for general adult medical examination with abnormal findings: Secondary | ICD-10-CM | POA: Diagnosis not present

## 2019-12-05 DIAGNOSIS — I129 Hypertensive chronic kidney disease with stage 1 through stage 4 chronic kidney disease, or unspecified chronic kidney disease: Secondary | ICD-10-CM | POA: Diagnosis not present

## 2019-12-09 DIAGNOSIS — Z23 Encounter for immunization: Secondary | ICD-10-CM | POA: Diagnosis not present

## 2020-01-09 DIAGNOSIS — Z23 Encounter for immunization: Secondary | ICD-10-CM | POA: Diagnosis not present

## 2020-01-14 DIAGNOSIS — C50911 Malignant neoplasm of unspecified site of right female breast: Secondary | ICD-10-CM | POA: Diagnosis not present

## 2020-01-17 ENCOUNTER — Other Ambulatory Visit: Payer: Self-pay | Admitting: Orthopedic Surgery

## 2020-01-17 DIAGNOSIS — M47816 Spondylosis without myelopathy or radiculopathy, lumbar region: Secondary | ICD-10-CM

## 2020-01-17 DIAGNOSIS — M1711 Unilateral primary osteoarthritis, right knee: Secondary | ICD-10-CM

## 2020-01-17 DIAGNOSIS — M79604 Pain in right leg: Secondary | ICD-10-CM

## 2020-01-17 DIAGNOSIS — M4316 Spondylolisthesis, lumbar region: Secondary | ICD-10-CM

## 2020-02-25 ENCOUNTER — Other Ambulatory Visit: Payer: Self-pay | Admitting: Orthopedic Surgery

## 2020-02-25 DIAGNOSIS — M47816 Spondylosis without myelopathy or radiculopathy, lumbar region: Secondary | ICD-10-CM

## 2020-02-25 DIAGNOSIS — M79604 Pain in right leg: Secondary | ICD-10-CM

## 2020-02-25 DIAGNOSIS — M4316 Spondylolisthesis, lumbar region: Secondary | ICD-10-CM

## 2020-02-25 DIAGNOSIS — M1711 Unilateral primary osteoarthritis, right knee: Secondary | ICD-10-CM

## 2020-04-06 DIAGNOSIS — H4010X0 Unspecified open-angle glaucoma, stage unspecified: Secondary | ICD-10-CM | POA: Diagnosis not present

## 2020-04-06 DIAGNOSIS — I10 Essential (primary) hypertension: Secondary | ICD-10-CM | POA: Diagnosis not present

## 2020-04-06 DIAGNOSIS — I129 Hypertensive chronic kidney disease with stage 1 through stage 4 chronic kidney disease, or unspecified chronic kidney disease: Secondary | ICD-10-CM | POA: Diagnosis not present

## 2020-04-06 DIAGNOSIS — M545 Low back pain: Secondary | ICD-10-CM | POA: Diagnosis not present

## 2020-04-06 DIAGNOSIS — D0511 Intraductal carcinoma in situ of right breast: Secondary | ICD-10-CM | POA: Diagnosis not present

## 2020-04-06 DIAGNOSIS — N182 Chronic kidney disease, stage 2 (mild): Secondary | ICD-10-CM | POA: Diagnosis not present

## 2020-04-06 DIAGNOSIS — E782 Mixed hyperlipidemia: Secondary | ICD-10-CM | POA: Diagnosis not present

## 2020-04-06 DIAGNOSIS — Q821 Xeroderma pigmentosum: Secondary | ICD-10-CM | POA: Diagnosis not present

## 2020-04-06 DIAGNOSIS — E876 Hypokalemia: Secondary | ICD-10-CM | POA: Diagnosis not present

## 2020-04-06 DIAGNOSIS — M79604 Pain in right leg: Secondary | ICD-10-CM | POA: Diagnosis not present

## 2020-04-06 DIAGNOSIS — E669 Obesity, unspecified: Secondary | ICD-10-CM | POA: Diagnosis not present

## 2020-04-06 DIAGNOSIS — N1831 Chronic kidney disease, stage 3a: Secondary | ICD-10-CM | POA: Diagnosis not present

## 2020-04-07 ENCOUNTER — Inpatient Hospital Stay: Payer: Medicare Other | Attending: Hematology and Oncology | Admitting: Hematology and Oncology

## 2020-04-07 NOTE — Assessment & Plan Note (Deleted)
06/12/2019:Screening mammogram detected right breast asymmetry. Diagnostic mammogram and US showed 0.5cm right breast mass. Biopsy showed IDC, grade 1, HER-2 - (0), ER+ 100%, PR+ 70%, Ki67 5%. T1 a N0 stage Ia  06/16/2019: Right lumpectomy:Right lumpectomy Ninfa Linden): IDC with intermediate grade DCIS, 0.6cm, grade 2, lymphovascular space invasion present, clear margins, and one right axillary lymph node negative. grade 1, HER-2 - (0), ER+ 100%, PR+ 70%, Ki67 5%. T1BN0 stage Ia  Current Treatment: Anastrozole 1 Mg Daily 07/12/2019 Anastrozole toxicities:  Breast cancer surveillance: 1.  Breast exam 04/07/2020: Benign 2.  Mammogram and bone density have been ordered for July 2021  Return to clinic in 1 year for follow-up

## 2020-04-09 DIAGNOSIS — N182 Chronic kidney disease, stage 2 (mild): Secondary | ICD-10-CM | POA: Diagnosis not present

## 2020-04-09 DIAGNOSIS — E876 Hypokalemia: Secondary | ICD-10-CM | POA: Diagnosis not present

## 2020-04-09 DIAGNOSIS — R7303 Prediabetes: Secondary | ICD-10-CM | POA: Diagnosis not present

## 2020-04-09 DIAGNOSIS — M79602 Pain in left arm: Secondary | ICD-10-CM | POA: Diagnosis not present

## 2020-04-09 DIAGNOSIS — I129 Hypertensive chronic kidney disease with stage 1 through stage 4 chronic kidney disease, or unspecified chronic kidney disease: Secondary | ICD-10-CM | POA: Diagnosis not present

## 2020-04-09 DIAGNOSIS — Z853 Personal history of malignant neoplasm of breast: Secondary | ICD-10-CM | POA: Diagnosis not present

## 2020-04-09 DIAGNOSIS — E782 Mixed hyperlipidemia: Secondary | ICD-10-CM | POA: Diagnosis not present

## 2020-04-09 DIAGNOSIS — R002 Palpitations: Secondary | ICD-10-CM | POA: Diagnosis not present

## 2020-04-09 DIAGNOSIS — Z0001 Encounter for general adult medical examination with abnormal findings: Secondary | ICD-10-CM | POA: Diagnosis not present

## 2020-04-09 DIAGNOSIS — Z85118 Personal history of other malignant neoplasm of bronchus and lung: Secondary | ICD-10-CM | POA: Diagnosis not present

## 2020-04-09 DIAGNOSIS — R7301 Impaired fasting glucose: Secondary | ICD-10-CM | POA: Diagnosis not present

## 2020-05-03 ENCOUNTER — Other Ambulatory Visit: Payer: Self-pay | Admitting: Orthopedic Surgery

## 2020-05-03 DIAGNOSIS — M47816 Spondylosis without myelopathy or radiculopathy, lumbar region: Secondary | ICD-10-CM

## 2020-05-03 DIAGNOSIS — M79604 Pain in right leg: Secondary | ICD-10-CM

## 2020-05-03 DIAGNOSIS — M1711 Unilateral primary osteoarthritis, right knee: Secondary | ICD-10-CM

## 2020-05-03 DIAGNOSIS — M4316 Spondylolisthesis, lumbar region: Secondary | ICD-10-CM

## 2020-06-12 ENCOUNTER — Other Ambulatory Visit: Payer: Self-pay | Admitting: Hematology and Oncology

## 2020-06-23 ENCOUNTER — Other Ambulatory Visit (HOSPITAL_COMMUNITY): Payer: Self-pay | Admitting: Adult Health

## 2020-06-23 DIAGNOSIS — Z9889 Other specified postprocedural states: Secondary | ICD-10-CM

## 2020-06-29 ENCOUNTER — Ambulatory Visit (HOSPITAL_COMMUNITY)
Admission: RE | Admit: 2020-06-29 | Discharge: 2020-06-29 | Disposition: A | Discharge status: Home / Self Care | Payer: Medicare Other | Source: Ambulatory Visit | Attending: Adult Health | Admitting: Adult Health

## 2020-06-29 ENCOUNTER — Other Ambulatory Visit: Payer: Self-pay

## 2020-06-29 ENCOUNTER — Encounter (HOSPITAL_COMMUNITY): Payer: Self-pay

## 2020-06-29 DIAGNOSIS — Z9889 Other specified postprocedural states: Secondary | ICD-10-CM

## 2020-06-29 DIAGNOSIS — Z170 Estrogen receptor positive status [ER+]: Secondary | ICD-10-CM | POA: Insufficient documentation

## 2020-06-29 DIAGNOSIS — R928 Other abnormal and inconclusive findings on diagnostic imaging of breast: Secondary | ICD-10-CM | POA: Diagnosis not present

## 2020-06-29 DIAGNOSIS — E2839 Other primary ovarian failure: Secondary | ICD-10-CM

## 2020-06-29 DIAGNOSIS — M85851 Other specified disorders of bone density and structure, right thigh: Secondary | ICD-10-CM | POA: Diagnosis not present

## 2020-06-29 DIAGNOSIS — C50311 Malignant neoplasm of lower-inner quadrant of right female breast: Secondary | ICD-10-CM | POA: Diagnosis not present

## 2020-06-30 ENCOUNTER — Telehealth: Payer: Self-pay

## 2020-06-30 NOTE — Telephone Encounter (Signed)
-----   Message from Gardenia Phlegm, NP sent at 06/30/2020 12:23 PM EDT ----- Please call patient and let her know that she needs to please schedule an appt with [place] to review how she is doing after her cancer diagnosis, and to review her bone density report.    Nothing alarming, we just need to see her in the office.    If agreeable, please send schedule message for [facility] f/u int he next few weeks when he has an opening. ----- Message ----- From: Interface, Rad Results In Sent: 06/29/2020   3:49 PM EDT To: Gardenia Phlegm, NP

## 2020-06-30 NOTE — Telephone Encounter (Signed)
Called pt to discuss below information. Pt is in agreement with scheduling an appt w/ Dr Lindi Adie. Message sent to scheduling to arrange this. Pt understands she should expect a call.

## 2020-07-01 ENCOUNTER — Telehealth: Payer: Self-pay | Admitting: Hematology and Oncology

## 2020-07-01 NOTE — Telephone Encounter (Signed)
Scheduled apt per 8/4 sch msg - pt is aware of appt date and time

## 2020-07-15 ENCOUNTER — Inpatient Hospital Stay: Payer: Medicare Other | Attending: Hematology and Oncology | Admitting: Hematology and Oncology

## 2020-07-15 NOTE — Assessment & Plan Note (Deleted)
06/16/2019: Right lumpectomy:Right lumpectomy Miranda [name]): IDC with intermediate grade DCIS, 0.6cm, grade 2, lymphovascular space invasion present, clear margins, and one right axillary lymph node negative. grade 1, HER-2 - (0), ER+ 100%, PR+ 70%, Ki67 5%. T1BN0 stage Ia  Current treatment: Anastrozole 1 mg daily started 07/15/2019  Anastrozole toxicities:  Breast cancer surveillance: 1.  Breast exam 07/15/2020: Benign 2. mammogram 06/29/2020: Benign breast density category B  Return to clinic in 1 year for follow-up

## 2020-08-19 ENCOUNTER — Ambulatory Visit
Admission: EM | Admit: 2020-08-19 | Discharge: 2020-08-19 | Disposition: A | Discharge status: Home / Self Care | Payer: Medicare Other | Attending: Emergency Medicine | Admitting: Emergency Medicine

## 2020-08-19 ENCOUNTER — Other Ambulatory Visit: Payer: Self-pay

## 2020-08-19 DIAGNOSIS — M5442 Lumbago with sciatica, left side: Secondary | ICD-10-CM | POA: Diagnosis not present

## 2020-08-19 MED ORDER — DEXAMETHASONE SODIUM PHOSPHATE 10 MG/ML IJ SOLN
10.0000 mg | Freq: Once | INTRAMUSCULAR | Status: AC
  Administered 2020-08-19: 10 mg via INTRAMUSCULAR

## 2020-08-19 MED ORDER — PREDNISONE 20 MG PO TABS: 20.0000 mg | ORAL_TABLET | Freq: Two times a day (BID) | ORAL | 0 refills | Status: AC

## 2020-08-19 MED ORDER — TIZANIDINE HCL 2 MG PO CAPS
2.0000 mg | ORAL_CAPSULE | Freq: Three times a day (TID) | ORAL | 0 refills | Status: DC
Start: 2020-08-19 — End: 2020-08-24

## 2020-08-19 NOTE — ED Provider Notes (Signed)
[place]   [id number] 08/19/20 Arrival Time: 0933  CC: LT low back  SUBJECTIVE: History from: patient. Miranda [name] is a 86 y.o. female complains of LT low back pain x 3 days.  Denies a precipitating event or specific injury.  Localizes the pain to the LT low back.  Describes the pain as constant and throbbing in character.  Has tried OTC medications without relief.  Symptoms are made worse with laying down.  Denies similar symptoms in the past.  Denies fever, chills, erythema, ecchymosis, effusion, weakness, numbness and tingling, saddle paresthesias, loss of bowel or bladder function.      ROS: As per HPI.  All other pertinent ROS negative.     Past Medical History:  Diagnosis Date  . Asthma   . Cancer ([place])   . Essential hypertension, benign   . Mixed hyperlipidemia   . Nephrolithiasis    Past Surgical History:  Procedure Laterality Date  . ABDOMINAL HYSTERECTOMY     excessive bleeding, no cancer   . BREAST LUMPECTOMY Right 2020  . Left knee arthroscopy due to torn ligament and cartiallege  2006   [facility]   . Left lung - lobectomy lower lobe - lung cancer    . RADIOACTIVE SEED GUIDED PARTIAL MASTECTOMY WITH AXILLARY SENTINEL LYMPH NODE BIOPSY Right 06/24/2019   Procedure: RADIOACTIVE SEED GUIDED RIGHT BREAST PARTIAL MASTECTOMY WITH  SENTINEL LYMPH NODE BIOPSY;  Surgeon: Coralie Keens, MD;  Location: [place];  Service: General;  Laterality: Right;   Allergies  Allergen Reactions  . Bee Venom Swelling   No current facility-administered medications on file prior to encounter.   Current Outpatient Medications on File Prior to Encounter  Medication Sig Dispense Refill  . acetaminophen (TYLENOL) 500 MG tablet Take 500 mg by mouth every 6 (six) hours as needed for moderate pain or headache.    . anastrozole (ARIMIDEX) 1 MG tablet TAKE 1 TABLET BY MOUTH EVERY DAY 90 tablet 3  . aspirin (ASPIRIN LOW DOSE) 81 MG EC tablet Take 81 mg by mouth daily.      .  cetirizine (ZYRTEC) 10 MG tablet Take 10 mg by mouth daily as needed for allergies.    . fluocinonide cream (LIDEX) 5.32 % Apply 1 application topically See admin instructions. Apply topically twice a day for 2 weeks, then off 1 week. Repeat once  5  . fluticasone (FLONASE) 50 MCG/ACT nasal spray Place 1 spray into both nostrils daily as needed for allergies or rhinitis.    Marland Kitchen ibuprofen (ADVIL) 800 MG tablet TAKE 1 TABLET BY MOUTH EVERY 8 HOURS AS NEEDED 90 tablet 1  . KLOR-CON M20 20 MEQ tablet Take 20 mEq by mouth daily.   5  . latanoprost (XALATAN) 0.005 % ophthalmic solution Place 1 drop into both eyes at bedtime.  3  . metoprolol tartrate (LOPRESSOR) 25 MG tablet Take 25 mg by mouth daily as needed (rapid heart rate).   3  . Polyvinyl Alcohol-Povidone (REFRESH OP) Place 1 drop into both eyes daily as needed (dry eyes).    Marland Kitchen telmisartan-hydrochlorothiazide (MICARDIS HCT) 80-25 MG tablet Take 1 tablet by mouth daily.  2  . torsemide (DEMADEX) 20 MG tablet Take 20 mg by mouth daily as needed (swelling).    . traMADol (ULTRAM) 50 MG tablet Take 1 tablet (50 mg total) by mouth every 6 (six) hours as needed. 20 tablet 0   Social History   Socioeconomic History  . Marital status: Divorced    Spouse  name: Not on file  . Number of children: 2  . Years of education: college  . Highest education level: Not on file  Occupational History  . Occupation: Public relations account executive for [employer]  . Smoking status: Never Smoker  . Smokeless tobacco: Never Used  Vaping Use  . Vaping Use: Never used  Substance and Sexual Activity  . Alcohol use: Yes    Comment: occasional  . Drug use: No  . Sexual activity: Not on file  Other Topics Concern  . Not on file  Social History Narrative  . Not on file   Social Determinants of Health   Financial Resource Strain:   . Difficulty of Paying Living Expenses: Not on file  Food Insecurity:   . Worried About Charity fundraiser in the Last  Year: Not on file  . Ran Out of Food in the Last Year: Not on file  Transportation Needs:   . Lack of Transportation (Medical): Not on file  . Lack of Transportation (Non-Medical): Not on file  Physical Activity:   . Days of Exercise per Week: Not on file  . Minutes of Exercise per Session: Not on file  Stress:   . Feeling of Stress : Not on file  Social Connections:   . Frequency of Communication with Friends and Family: Not on file  . Frequency of Social Gatherings with Friends and Family: Not on file  . Attends Religious Services: Not on file  . Active Member of Clubs or Organizations: Not on file  . Attends Archivist Meetings: Not on file  . Marital Status: Not on file  Intimate Partner Violence:   . Fear of Current or Ex-Partner: Not on file  . Emotionally Abused: Not on file  . Physically Abused: Not on file  . Sexually Abused: Not on file   Family History  Problem Relation Age of Onset  . Colon cancer Father   . Heart attack Mother   . Anuerysm Mother        Brain   . Stroke Sister   . Hypertension Sister   . Melanoma Sister   . Hypertension Sister   . Hypertension Son   . Cancer Other        Family history   . Arthritis Other        Family history     OBJECTIVE:  Vitals:   08/19/20 0957  BP: (!) 149/79  Pulse: 76  Resp: 17  Temp: 98.1 F (36.7 C)  TempSrc: Tympanic  SpO2: 96%    General appearance: ALERT; in no acute distress.  Head: NCAT Lungs: Normal respiratory effort Musculoskeletal: Back Inspection: Skin warm, dry, clear and intact without obvious erythema, effusion, or ecchymosis.  Palpation: TTP over LT low back ROM: FROM active and passive Strength: 5/5 shld abduction, 5/5 shld adduction, 5/5 elbow flexion, 5/5 elbow extension, 5/5 grip strength, 5/5 hip flexion, 5/5 hip extension Skin: warm and dry Neurologic: Ambulates without difficulty; Sensation intact about the upper/ lower extremities Psychological: alert and cooperative;  normal mood and affect  ASSESSMENT & PLAN:  1. Acute left-sided low back pain with left-sided sciatica     Meds ordered this encounter  Medications  . predniSONE (DELTASONE) 20 MG tablet    Sig: Take 1 tablet (20 mg total) by mouth 2 (two) times daily with a meal for 5 days.    Dispense:  10 tablet    Refill:  0    Order Specific  Question:   Supervising Provider    Answer:   Raylene Everts [[id number]]  . tizanidine (ZANAFLEX) 2 MG capsule    Sig: Take 1 capsule (2 mg total) by mouth 3 (three) times daily.    Dispense:  30 capsule    Refill:  0    Order Specific Question:   Supervising Provider    Answer:   Raylene Everts [[id number]]  . dexamethasone (DECADRON) injection 10 mg    Steroid shot given in office Continue conservative management of rest, ice, and gentle stretches Prednisone prescribed.  Take as directed and to completion Take zanaflex at nighttime for symptomatic relief. Avoid driving or operating heavy machinery while using medication. Follow up with PCP if symptoms persist Return or go to the ER if you have any new or worsening symptoms (fever, chills, chest pain, abdominal pain, changes in bowel or bladder habits, pain radiating into lower legs, etc...)   Reviewed expectations re: course of current medical issues. Questions answered. Outlined signs and symptoms indicating need for more acute intervention. Patient verbalized understanding. After Visit Summary given.    Lestine Box, PA-C 08/19/20 1042

## 2020-08-19 NOTE — ED Triage Notes (Signed)
Pt presents Left hip pain since Monday , denies injury

## 2020-08-19 NOTE — Discharge Instructions (Signed)
Steroid shot given in office Continue conservative management of rest, ice, and gentle stretches Prednisone prescribed.  Take as directed and to completion Take zanaflex at nighttime for symptomatic relief. Avoid driving or operating heavy machinery while using medication. Follow up with PCP if symptoms persist Return or go to the ER if you have any new or worsening symptoms (fever, chills, chest pain, abdominal pain, changes in bowel or bladder habits, pain radiating into lower legs, etc...)  

## 2020-08-24 ENCOUNTER — Telehealth: Payer: Self-pay | Admitting: Radiology

## 2020-08-24 ENCOUNTER — Other Ambulatory Visit: Payer: Self-pay

## 2020-08-24 ENCOUNTER — Encounter: Payer: Self-pay | Admitting: Orthopedic Surgery

## 2020-08-24 ENCOUNTER — Ambulatory Visit (INDEPENDENT_AMBULATORY_CARE_PROVIDER_SITE_OTHER): Payer: Medicare Other | Admitting: Orthopedic Surgery

## 2020-08-24 VITALS — BP 139/61 | HR 51 | Ht 60.0 in | Wt 158.0 lb

## 2020-08-24 DIAGNOSIS — M5442 Lumbago with sciatica, left side: Secondary | ICD-10-CM

## 2020-08-24 MED ORDER — TIZANIDINE HCL 4 MG PO TABS: 4.0000 mg | ORAL_TABLET | Freq: Three times a day (TID) | ORAL | 0 refills | Status: DC

## 2020-08-24 MED ORDER — TRAMADOL HCL 50 MG PO TABS: 50.0000 mg | ORAL_TABLET | Freq: Four times a day (QID) | ORAL | 0 refills | Status: AC | PRN

## 2020-08-24 MED ORDER — IBUPROFEN 800 MG PO TABS: 800.0000 mg | ORAL_TABLET | Freq: Three times a day (TID) | ORAL | 1 refills | Status: DC | PRN

## 2020-08-24 MED ORDER — GABAPENTIN 100 MG PO CAPS: 100.0000 mg | ORAL_CAPSULE | Freq: Three times a day (TID) | ORAL | 2 refills | Status: DC

## 2020-08-24 NOTE — Telephone Encounter (Signed)
I talked to her Miranda [name] will put her on the schedule She does not need referral

## 2020-08-24 NOTE — Telephone Encounter (Signed)
-----   Message from Carole Civil, MD sent at 08/24/2020 11:54 AM EDT ----- Regarding: add on [facility] team  I need to see Ms Steller today  At 130  Can someone call her and make sure she doesnt need a referral   She doesn't need xrays today

## 2020-08-24 NOTE — Progress Notes (Signed)
NEW PROBLEM//OFFICE VISIT  Chief Complaint  Patient presents with  . Back Pain    increased pain last week went to Urgent Care     Encounter Diagnosis  Name Primary?  . Acute left-sided low back pain with left-sided sciatica Yes    Assessment and plan 86 year old female with a history of back pain presents with acute onset of back pain with radiation to the left knee no weakness or red flags are noted in the history or physical exam.  I adjusted her medications We advised her to use heating pad Sleep with pillows under the legs Return in 1 week  Meds ordered this encounter  Medications  . ibuprofen (ADVIL) 800 MG tablet    Sig: Take 1 tablet (800 mg total) by mouth every 8 (eight) hours as needed.    Dispense:  90 tablet    Refill:  1  . tiZANidine (ZANAFLEX) 4 MG tablet    Sig: Take 1 tablet (4 mg total) by mouth 3 (three) times daily for 21 days.    Dispense:  63 tablet    Refill:  0  . traMADol (ULTRAM) 50 MG tablet    Sig: Take 1 tablet (50 mg total) by mouth every 6 (six) hours as needed for up to 5 days.    Dispense:  20 tablet    Refill:  0  . gabapentin (NEURONTIN) 100 MG capsule    Sig: Take 1 capsule (100 mg total) by mouth 3 (three) times daily.    Dispense:  90 capsule    Refill:  56     86 year old female with a history of lumbar spine disease presents with a 1 week history of pain in her lower back and radiating to her left knee.  She denies any trauma. She says approximately 5 days after driving to [place] she got up out of a chair felt some uneasiness and a twinge of pain in her left buttock which eventually radiated to her left leg that was last Monday, September 20 by Thursday she had to go to urgent care she was treated there with an intramuscular injection oral prednisone and tizanidine 2 mg she got some intermittent relief but continues to complain of a dull constant aching pain in the left side of her lower back continuing to radiate to the left  leg     Review of Systems  Constitutional: Negative for chills, fever, malaise/fatigue and weight loss.  Gastrointestinal: Negative for constipation.       Denies loss bowel control   Genitourinary:       Denies urinary retention or los of bladder control      Past Medical History:  Diagnosis Date  . Asthma   . Cancer ([place])   . Essential hypertension, benign   . Mixed hyperlipidemia   . Nephrolithiasis     Past Surgical History:  Procedure Laterality Date  . ABDOMINAL HYSTERECTOMY     excessive bleeding, no cancer   . BREAST LUMPECTOMY Right 2020  . Left knee arthroscopy due to torn ligament and cartiallege  2006   Richanda Darin   . Left lung - lobectomy lower lobe - lung cancer    . RADIOACTIVE SEED GUIDED PARTIAL MASTECTOMY WITH AXILLARY SENTINEL LYMPH NODE BIOPSY Right 06/24/2019   Procedure: RADIOACTIVE SEED GUIDED RIGHT BREAST PARTIAL MASTECTOMY WITH  SENTINEL LYMPH NODE BIOPSY;  Surgeon: Coralie Keens, MD;  Location: [facility];  Service: General;  Laterality: Right;    Family History  Problem Relation Age of  Onset  . Colon cancer Father   . Heart attack Mother   . Anuerysm Mother        Brain   . Stroke Sister   . Hypertension Sister   . Melanoma Sister   . Hypertension Sister   . Hypertension Son   . Cancer Other        Family history   . Arthritis Other        Family history    Social History   Tobacco Use  . Smoking status: Never Smoker  . Smokeless tobacco: Never Used  Vaping Use  . Vaping Use: Never used  Substance Use Topics  . Alcohol use: Yes    Comment: occasional  . Drug use: No    Allergies  Allergen Reactions  . Bee Venom Swelling    Current Meds  Medication Sig  . acetaminophen (TYLENOL) 500 MG tablet Take 500 mg by mouth every 6 (six) hours as needed for moderate pain or headache.  . anastrozole (ARIMIDEX) 1 MG tablet TAKE 1 TABLET BY MOUTH EVERY DAY  . aspirin (ASPIRIN LOW DOSE) 81 MG EC tablet Take 81 mg by mouth daily.    .  cetirizine (ZYRTEC) 10 MG tablet Take 10 mg by mouth daily as needed for allergies.  . fluocinonide cream (LIDEX) 8.31 % Apply 1 application topically See admin instructions. Apply topically twice a day for 2 weeks, then off 1 week. Repeat once  . fluticasone (FLONASE) 50 MCG/ACT nasal spray Place 1 spray into both nostrils daily as needed for allergies or rhinitis.  Marland Kitchen ibuprofen (ADVIL) 800 MG tablet Take 1 tablet (800 mg total) by mouth every 8 (eight) hours as needed.  Marland Kitchen KLOR-CON M20 20 MEQ tablet Take 20 mEq by mouth daily.   Marland Kitchen latanoprost (XALATAN) 0.005 % ophthalmic solution Place 1 drop into both eyes at bedtime.  . metoprolol tartrate (LOPRESSOR) 25 MG tablet Take 25 mg by mouth daily as needed (rapid heart rate).   . Polyvinyl Alcohol-Povidone (REFRESH OP) Place 1 drop into both eyes daily as needed (dry eyes).  . predniSONE (DELTASONE) 20 MG tablet Take 1 tablet (20 mg total) by mouth 2 (two) times daily with a meal for 5 days.  Marland Kitchen telmisartan-hydrochlorothiazide (MICARDIS HCT) 80-25 MG tablet Take 1 tablet by mouth daily.  Marland Kitchen torsemide (DEMADEX) 20 MG tablet Take 20 mg by mouth daily as needed (swelling).  . traMADol (ULTRAM) 50 MG tablet Take 1 tablet (50 mg total) by mouth every 6 (six) hours as needed for up to 5 days.  . [DISCONTINUED] ibuprofen (ADVIL) 800 MG tablet TAKE 1 TABLET BY MOUTH EVERY 8 HOURS AS NEEDED  . [DISCONTINUED] tizanidine (ZANAFLEX) 2 MG capsule Take 1 capsule (2 mg total) by mouth 3 (three) times daily.  . [DISCONTINUED] traMADol (ULTRAM) 50 MG tablet Take 1 tablet (50 mg total) by mouth every 6 (six) hours as needed.    BP 139/61   Pulse (!) 51   Ht 5' (1.524 m)   Wt 158 lb (71.7 kg)   BMI 30.86 kg/m   Physical Exam Constitutional:      General: She is not in acute distress.    Appearance: She is well-developed.  Cardiovascular:     Comments: No peripheral edema Skin:    General: Skin is warm and dry.     Capillary Refill: Capillary refill takes less  than 2 seconds.  Neurological:     General: No focal deficit present.     Mental  Status: She is alert and oriented to person, place, and time.     Sensory: No sensory deficit.     Motor: No weakness.     Coordination: Coordination normal.     Gait: Gait abnormal.     Deep Tendon Reflexes: Reflexes are normal and symmetric. Reflexes normal.     Ortho Exam  Lumbar spine   Tender lower back and left gluteus   Normal strength in both lower extremities   Normal sensation in both lower extremities     MEDICAL DECISION MAKING  A.  Encounter Diagnosis  Name Primary?  . Acute left-sided low back pain with left-sided sciatica Yes    B. DATA ANALYSED:   IMAGING: Interpretation of images: none (, 6 weeks and no red flags)   Orders: none   Outside records reviewed: urgent care they treated her with Decadron IM started tizanidine and prednisone 20 mg twice a daily for 5 days   C. MANAGEMENT   Observation Medication change  Recheck 1 weeks   Meds ordered this encounter  Medications  . ibuprofen (ADVIL) 800 MG tablet    Sig: Take 1 tablet (800 mg total) by mouth every 8 (eight) hours as needed.    Dispense:  90 tablet    Refill:  1  . tiZANidine (ZANAFLEX) 4 MG tablet    Sig: Take 1 tablet (4 mg total) by mouth 3 (three) times daily for 21 days.    Dispense:  63 tablet    Refill:  0  . traMADol (ULTRAM) 50 MG tablet    Sig: Take 1 tablet (50 mg total) by mouth every 6 (six) hours as needed for up to 5 days.    Dispense:  20 tablet    Refill:  0  . gabapentin (NEURONTIN) 100 MG capsule    Sig: Take 1 capsule (100 mg total) by mouth 3 (three) times daily.    Dispense:  90 capsule    Refill:  2      Arther Abbott, MD  08/24/2020 1:35 PM

## 2020-08-24 NOTE — Patient Instructions (Addendum)
Sciatica  Sciatica is pain, numbness, weakness, or tingling along the path of the sciatic nerve. The sciatic nerve starts in the lower back and runs down the back of each leg. The nerve controls the muscles in the lower leg and in the back of the knee. It also provides feeling (sensation) to the back of the thigh, the lower leg, and the sole of the foot. Sciatica is a symptom of another medical condition that pinches or puts pressure on the sciatic nerve. Sciatica most often only affects one side of the body. Sciatica usually goes away on its own or with treatment. In some cases, sciatica may come back (recur). What are the causes? This condition is caused by pressure on the sciatic nerve or pinching of the nerve. This may be the result of:  A disk in between the bones of the spine bulging out too far (herniated disk).  Age-related changes in the spinal disks.  A pain disorder that affects a muscle in the buttock.  Extra bone growth near the sciatic nerve.  A break (fracture) of the pelvis.  Pregnancy.  Tumor. This is rare. What increases the risk? The following factors may make you more likely to develop this condition:  Playing sports that place pressure or stress on the spine.  Having poor strength and flexibility.  A history of back injury or surgery.  Sitting for long periods of time.  Doing activities that involve repetitive bending or lifting.  Obesity. What are the signs or symptoms? Symptoms can vary from mild to very severe, and they may include:  Any of these problems in the lower back, leg, hip, or buttock: ? Mild tingling, numbness, or dull aches. ? Burning sensations. ? Sharp pains.  Numbness in the back of the calf or the sole of the foot.  Leg weakness.  Severe back pain that makes movement difficult. Symptoms may get worse when you cough, sneeze, or laugh, or when you sit or stand for long periods of time. How is this diagnosed? This condition may be  diagnosed based on:  Your symptoms and medical history.  A physical exam.  Blood tests.  Imaging tests, such as: ? X-rays. ? MRI. ? CT scan. How is this treated? In many cases, this condition improves on its own without treatment. However, treatment may include:  Reducing or modifying physical activity.  Exercising and stretching.  Icing and applying heat to the affected area.  Medicines that help to: ? Relieve pain and swelling. ? Relax your muscles.  Injections of medicines that help to relieve pain, irritation, and inflammation around the sciatic nerve (steroids).  Surgery. Follow these instructions at home: Medicines  Take over-the-counter and prescription medicines only as told by your health care provider.  Ask your health care provider if the medicine prescribed to you: ? Requires you to avoid driving or using heavy machinery. ? Can cause constipation. You may need to take these actions to prevent or treat constipation:  Drink enough fluid to keep your urine pale yellow.  Take over-the-counter or prescription medicines.  Eat foods that are high in fiber, such as beans, whole grains, and fresh fruits and vegetables.  Limit foods that are high in fat and processed sugars, such as fried or sweet foods. Managing pain      If directed, put ice on the affected area. ? Put ice in a plastic bag. ? Place a towel between your skin and the bag. ? Leave the ice on for 20 minutes,  2-3 times a day.  If directed, apply heat to the affected area. Use the heat source that your health care provider recommends, such as a moist heat pack or a heating pad. ? Place a towel between your skin and the heat source. ? Leave the heat on for 20-30 minutes. ? Remove the heat if your skin turns bright red. This is especially important if you are unable to feel pain, heat, or cold. You may have a greater risk of getting burned. Activity   Return to your normal activities as told  by your health care provider. Ask your health care provider what activities are safe for you.  Avoid activities that make your symptoms worse.  Take brief periods of rest throughout the day. ? When you rest for longer periods, mix in some mild activity or stretching between periods of rest. This will help to prevent stiffness and pain. ? Avoid sitting for long periods of time without moving. Get up and move around at least one time each hour.  Exercise and stretch regularly, as told by your health care provider.  Do not lift anything that is heavier than 10 lb (4.5 kg) while you have symptoms of sciatica. When you do not have symptoms, you should still avoid heavy lifting, especially repetitive heavy lifting.  When you lift objects, always use proper lifting technique, which includes: ? Bending your knees. ? Keeping the load close to your body. ? Avoiding twisting. General instructions  Maintain a healthy weight. Excess weight puts extra stress on your back.  Wear supportive, comfortable shoes. Avoid wearing high heels.  Avoid sleeping on a mattress that is too soft or too hard. A mattress that is firm enough to support your back when you sleep may help to reduce your pain.  Keep all follow-up visits as told by your health care provider. This is important. Contact a health care provider if:  You have pain that: ? Wakes you up when you are sleeping. ? Gets worse when you lie down. ? Is worse than you have experienced in the past. ? Lasts longer than 4 weeks.  You have an unexplained weight loss. Get help right away if:  You are not able to control when you urinate or have bowel movements (incontinence).  You have: ? Weakness in your lower back, pelvis, buttocks, or legs that gets worse. ? Redness or swelling of your back. ? A burning sensation when you urinate. Summary  Sciatica is pain, numbness, weakness, or tingling along the path of the sciatic nerve.  This condition  is caused by pressure on the sciatic nerve or pinching of the nerve.  Sciatica can cause pain, numbness, or tingling in the lower back, legs, hips, and buttocks.  Treatment often includes rest, exercise, medicines, and applying ice or heat. This information is not intended to replace advice given to you by your health care provider. Make sure you discuss any questions you have with your health care provider. Document Revised: 12/02/2018 Document Reviewed: 12/02/2018 Elsevier Patient Education  [place].  Acute Back Pain, Adult Acute back pain is sudden and usually short-lived. It is often caused by an injury to the muscles and tissues in the back. The injury may result from:  A muscle or ligament getting overstretched or torn (strained). Ligaments are tissues that connect bones to each other. Lifting something improperly can cause a back strain.  Wear and tear (degeneration) of the spinal disks. Spinal disks are circular tissue that provides cushioning  between the bones of the spine (vertebrae).  Twisting motions, such as while playing sports or doing yard work.  A hit to the back.  Arthritis. You may have a physical exam, lab tests, and imaging tests to find the cause of your pain. Acute back pain usually goes away with rest and home care. Follow these instructions at home: Managing pain, stiffness, and swelling  Take over-the-counter and prescription medicines only as told by your health care provider.  Your health care provider may recommend applying ice during the first 24-48 hours after your pain starts. To do this: ? Put ice in a plastic bag. ? Place a towel between your skin and the bag. ? Leave the ice on for 20 minutes, 2-3 times a day.  If directed, apply heat to the affected area as often as told by your health care provider. Use the heat source that your health care provider recommends, such as a moist heat pack or a heating pad. ? Place a towel between your  skin and the heat source. ? Leave the heat on for 20-30 minutes. ? Remove the heat if your skin turns bright red. This is especially important if you are unable to feel pain, heat, or cold. You have a greater risk of getting burned. Activity   Do not stay in bed. Staying in bed for more than 1-2 days can delay your recovery.  Sit up and stand up straight. Avoid leaning forward when you sit, or hunching over when you stand. ? If you work at a desk, sit close to it so you do not need to lean over. Keep your chin tucked in. Keep your neck drawn back, and keep your elbows bent at a right angle. Your arms should look like the letter "L." ? Sit high and close to the steering wheel when you drive. Add lower back (lumbar) support to your car seat, if needed.  Take short walks on even surfaces as soon as you are able. Try to increase the length of time you walk each day.  Do not sit, drive, or stand in one place for more than 30 minutes at a time. Sitting or standing for long periods of time can put stress on your back.  Do not drive or use heavy machinery while taking prescription pain medicine.  Use proper lifting techniques. When you bend and lift, use positions that put less stress on your back: ? [place] your knees. ? Keep the load close to your body. ? Avoid twisting.  Exercise regularly as told by your health care provider. Exercising helps your back heal faster and helps prevent back injuries by keeping muscles strong and flexible.  Work with a physical therapist to make a safe exercise program, as recommended by your health care provider. Do any exercises as told by your physical therapist. Lifestyle  Maintain a healthy weight. Extra weight puts stress on your back and makes it difficult to have good posture.  Avoid activities or situations that make you feel anxious or stressed. Stress and anxiety increase muscle tension and can make back pain worse. Learn ways to manage anxiety and  stress, such as through exercise. General instructions  Sleep on a firm mattress in a comfortable position. Try lying on your side with your knees slightly bent. If you lie on your back, put a pillow under your knees.  Follow your treatment plan as told by your health care provider. This may include: ? Cognitive or behavioral therapy. ? Acupuncture or  massage therapy. ? Meditation or yoga. Contact a health care provider if:  You have pain that is not relieved with rest or medicine.  You have increasing pain going down into your legs or buttocks.  Your pain does not improve after 2 weeks.  You have pain at night.  You lose weight without trying.  You have a fever or chills. Get help right away if:  You develop new bowel or bladder control problems.  You have unusual weakness or numbness in your arms or legs.  You develop nausea or vomiting.  You develop abdominal pain.  You feel faint. Summary  Acute back pain is sudden and usually short-lived.  Use proper lifting techniques. When you bend and lift, use positions that put less stress on your back.  Take over-the-counter and prescription medicines and apply heat or ice as directed by your health care provider. This information is not intended to replace advice given to you by your health care provider. Make sure you discuss any questions you have with your health care provider. Document Revised: 03/04/2019 Document Reviewed: 06/27/2017 Elsevier Patient Education  [place].   Use heat pad for 30 min a t a time   Lie flat with pillows under the knees when in bed   Take medications as listed   Meds ordered this encounter  Medications  . ibuprofen (ADVIL) 800 MG tablet    Sig: Take 1 tablet (800 mg total) by mouth every 8 (eight) hours as needed.    Dispense:  90 tablet    Refill:  1  . tiZANidine (ZANAFLEX) 4 MG tablet    Sig: Take 1 tablet (4 mg total) by mouth 3 (three) times daily for 21 days.     Dispense:  63 tablet    Refill:  0  . traMADol (ULTRAM) 50 MG tablet    Sig: Take 1 tablet (50 mg total) by mouth every 6 (six) hours as needed for up to 5 days.    Dispense:  20 tablet    Refill:  0  . gabapentin (NEURONTIN) 100 MG capsule    Sig: Take 1 capsule (100 mg total) by mouth 3 (three) times daily.    Dispense:  90 capsule    Refill:  2

## 2020-08-30 DIAGNOSIS — M5442 Lumbago with sciatica, left side: Secondary | ICD-10-CM | POA: Diagnosis not present

## 2020-08-30 DIAGNOSIS — Z23 Encounter for immunization: Secondary | ICD-10-CM | POA: Diagnosis not present

## 2020-09-02 ENCOUNTER — Encounter: Payer: Self-pay | Admitting: Orthopedic Surgery

## 2020-09-02 ENCOUNTER — Ambulatory Visit (INDEPENDENT_AMBULATORY_CARE_PROVIDER_SITE_OTHER): Payer: Medicare Other | Admitting: Orthopedic Surgery

## 2020-09-02 ENCOUNTER — Other Ambulatory Visit: Payer: Self-pay

## 2020-09-02 VITALS — BP 161/91 | HR 70 | Ht 60.0 in | Wt 158.0 lb

## 2020-09-02 DIAGNOSIS — M5442 Lumbago with sciatica, left side: Secondary | ICD-10-CM

## 2020-09-02 NOTE — Progress Notes (Signed)
Chief Complaint  Patient presents with  . Back Pain    feeling better    Encounter Diagnosis  Name Primary?  . Acute left-sided low back pain with left-sided sciatica Yes      Assessment and plan 86 year old female with a history of back pain presents with acute onset of back pain with radiation to the left knee no weakness or red flags are noted in the history or physical exam.   I adjusted her medications We advised her to use heating pad Sleep with pillows under the legs Return in 1 week  Today:   Miranda [name] says her pain has improved she still has some numbness on the lateral side of the left leg and pain into the left groin  She does indicate that the medication is taking her energy  Encounter Diagnosis  Name Primary?  . Acute left-sided low back pain with left-sided sciatica Yes     TIZANIDINE TAKE ONLY AS NEEDED   CONTINUE THE GABAPENTIN 100 MG 3 X A DAY   CONTINUE THE ADVIL 800 MG 3 X A DAY   START PHYSICAL THERAPY 2X A WEEK FOR 4 WEEKS

## 2020-09-02 NOTE — Patient Instructions (Addendum)
TIZANIDINE TAKE ONLY AS NEEDED   CONTINUE THE GABAPENTIN 100 MG 3 X A DAY   CONTINUE THE ADVIL 800 MG 3 X A DAY   START PHYSICAL THERAPY 2X A WEEK FOR 4 WEEKS

## 2020-09-05 ENCOUNTER — Other Ambulatory Visit: Payer: Self-pay | Admitting: Orthopedic Surgery

## 2020-09-05 DIAGNOSIS — M5442 Lumbago with sciatica, left side: Secondary | ICD-10-CM

## 2020-09-08 ENCOUNTER — Encounter (HOSPITAL_COMMUNITY): Payer: Self-pay | Admitting: Physical Therapy

## 2020-09-08 ENCOUNTER — Other Ambulatory Visit: Payer: Self-pay

## 2020-09-08 ENCOUNTER — Ambulatory Visit (HOSPITAL_COMMUNITY): Payer: Medicare Other | Attending: Orthopedic Surgery | Admitting: Physical Therapy

## 2020-09-08 DIAGNOSIS — M6281 Muscle weakness (generalized): Secondary | ICD-10-CM | POA: Diagnosis not present

## 2020-09-08 DIAGNOSIS — R29898 Other symptoms and signs involving the musculoskeletal system: Secondary | ICD-10-CM | POA: Diagnosis not present

## 2020-09-08 DIAGNOSIS — M25552 Pain in left hip: Secondary | ICD-10-CM | POA: Insufficient documentation

## 2020-09-08 DIAGNOSIS — R2689 Other abnormalities of gait and mobility: Secondary | ICD-10-CM | POA: Diagnosis not present

## 2020-09-08 DIAGNOSIS — M545 Low back pain, unspecified: Secondary | ICD-10-CM | POA: Diagnosis not present

## 2020-09-08 NOTE — Therapy (Signed)
[place], [place], [address] Phone: [phone]   Fax:  [phone]  Physical Therapy Evaluation  Patient Details  Name: Miranda [name] MRN: [id number] Date of Birth: [birth year] Referring Provider (PT): Arther Abbott MD   Encounter Date: 09/08/2020   PT End of Session - 09/08/20 1215    Visit Number 1    Number of Visits 12    Date for PT Re-Evaluation 10/20/20    Authorization Type Primary Medicare Secondary BCBS No auth, vl 50 PT/OT/ST 0 used    Authorization - Visit Number 1    Authorization - Number of Visits 50    Progress Note Due on Visit 10    PT Start Time 4401    PT Stop Time 1213    PT Time Calculation (min) 38 min    Activity Tolerance Patient tolerated treatment well    Behavior During Therapy WFL for tasks assessed/performed           Past Medical History:  Diagnosis Date  . Asthma   . Cancer ([place])   . Essential hypertension, benign   . Mixed hyperlipidemia   . Nephrolithiasis     Past Surgical History:  Procedure Laterality Date  . ABDOMINAL HYSTERECTOMY     excessive bleeding, no cancer   . BREAST LUMPECTOMY Right 2020  . Left knee arthroscopy due to torn ligament and cartiallege  2006   [facility]   . Left lung - lobectomy lower lobe - lung cancer    . RADIOACTIVE SEED GUIDED PARTIAL MASTECTOMY WITH AXILLARY SENTINEL LYMPH NODE BIOPSY Right 06/24/2019   Procedure: RADIOACTIVE SEED GUIDED RIGHT BREAST PARTIAL MASTECTOMY WITH  SENTINEL LYMPH NODE BIOPSY;  Surgeon: Coralie Keens, MD;  Location: [facility];  Service: General;  Laterality: Right;    There were no vitals filed for this visit.    Subjective Assessment - 09/08/20 1142    Subjective Patient is a 86 y.o. female who presents to physical therapy with c/o LBP with L sciatica. Patient states she is having problems with her sciatic nerve. Pain begins in buttocks with numbness in the side of her leg down to her knee. She has  slight lower back pain. No symptoms beyond her knee. She states symptoms began about 3-4 weeks ago with insidious onset. She also has at the inside of her leg by the groin that is worse with walking and better with stretching which started this week. Her main goal is to get rid of this pain. Symptoms improved with pillow under leg and SKTC. Symptoms are constant but has been gradually improving.    Limitations Walking;House hold activities;Standing    How long can you walk comfortably? 10 minutes    Patient Stated Goals decrease pain    Pain Score 8     Pain Location Hip    Pain Orientation Left    Pain Descriptors / Indicators Aching;Numbness              [facility] PT Assessment - 09/08/20 0001      Assessment   Medical Diagnosis Acute LBP with L sciatica    Referring Provider (PT) Arther Abbott MD    Onset Date/Surgical Date 08/10/20    Next MD Visit Nov 4    Prior Therapy yes for back      Precautions   Precautions None      Restrictions   Weight Bearing Restrictions No      Balance Screen   Has  the patient fallen in the past 6 months No    Has the patient had a decrease in activity level because of a fear of falling?  No    Is the patient reluctant to leave their home because of a fear of falling?  No      Prior Function   Level of Independence Independent    Vocation Retired    Leisure Move around and [employer] yard, lift things      Cognition   Overall Cognitive Status Within Functional Limits for tasks assessed      Observation/Other Assessments   Observations Ambulates without AD, trunk flexed    Focus on Therapeutic Outcomes (FOTO)  54% limited      Sensation   Light Touch Appears Intact      ROM / Strength   AROM / PROM / Strength AROM;Strength      AROM   AROM Assessment Site Lumbar    Lumbar Flexion 0% limited    Lumbar Extension 75% limited pain in low back     Lumbar - Right Side Bend 25% limited    Lumbar - Left Side Bend 50% limited pull in  anterior/medial hip    Lumbar - Right Rotation 25% limited    Lumbar - Left Rotation 25% limited pull in L hip      Strength   Strength Assessment Site Hip;Knee;Ankle    Right/Left Hip Right;Left    Right Hip Flexion 4+/5    Left Hip Flexion 4/5    Right/Left Knee Right;Left    Right Knee Flexion 5/5    Right Knee Extension 5/5    Left Knee Flexion 5/5    Left Knee Extension 5/5    Right/Left Ankle Right;Left    Right Ankle Dorsiflexion 5/5    Left Ankle Dorsiflexion 5/5      Palpation   Palpation comment TTP lumbar paraspinals at L5 on L, L hip adductors, glute max, no tenderness in glute med/min, piriformis      Ambulation/Gait   Ambulation/Gait Yes    Ambulation/Gait Assistance 7: Independent    Ambulation Distance (Feet) 310 Feet    Assistive device None    Gait Pattern Antalgic;Trendelenburg;Trunk flexed    Ambulation Surface Level;Indoor    Gait velocity decreased    Gait Comments 2MWT                      Objective measurements completed on examination: See above findings.               PT Education - 09/08/20 1143    Education Details Patient educated on exam findings, POC, scope of PT, hip adductor stretch, self STM    Methods Explanation;Demonstration    Comprehension Verbalized understanding;Returned demonstration            PT Short Term Goals - 09/08/20 1344      PT SHORT TERM GOAL #1   Title Patient will be independent with HEP in order to improve functional outcomes.    Time 3    Period Weeks    Status New    Target Date 09/29/20      PT SHORT TERM GOAL #2   Title Patient will report at least 25% improvement in symptoms for improved quality of life.    Time 3    Period Weeks    Status New    Target Date 09/29/20             PT  Long Term Goals - 09/08/20 1345      PT LONG TERM GOAL #1   Title Patient will report at least 75% improvement in symptoms for improved quality of life.    Time 6    Period Weeks     Status New    Target Date 10/20/20      PT LONG TERM GOAL #2   Title Patient will improve FOTO score by at least 5 points in order to indicate improved tolerance to activity.    Time 6    Period Weeks    Status New    Target Date 10/20/20      PT LONG TERM GOAL #3   Title Patient will demonstrate at least 25% improvement in lumbar ROM in all restricted planes for improved ability to move trunk while at home.    Time 6    Period Weeks    Status New    Target Date 10/20/20                  Plan - 09/08/20 1217    Clinical Impression Statement Patient is a 86 y.o. female who presents to physical therapy with c/o LBP with L sciatica and L hip adductor pain. She presents with pain limited deficits in Lumbar and hip strength, ROM, endurance, postural impairments, spinal mobility, tenderness to palpation, gait, and functional mobility with ADL. She is having to modify and restrict ADL as indicated by FOTO score as well as subjective information and objective measures which is affecting overall participation. Patient will benefit from skilled physical therapy in order to improve function and reduce impairment.    Personal Factors and Comorbidities Age;Comorbidity 3+;Fitness;Past/Current Experience    Comorbidities hx Back pain, BMI over 30, Cancer, High Blood Pressure    Examination-Activity Limitations Bed Mobility;Lift;Stand;Stairs;Squat;Sit;Locomotion Level;Transfers    Examination-Participation Restrictions Church;Cleaning;Community Activity;Yard Work;Volunteer;Shop    Stability/Clinical Decision Making Stable/Uncomplicated    Clinical Decision Making Low    Rehab Potential Good    PT Frequency 2x / week    PT Duration 6 weeks    PT Treatment/Interventions ADLs/Self Care Home Management;Aquatic Therapy;Biofeedback;Electrical Stimulation;Iontophoresis 4mg /ml Dexamethasone;Moist Heat;Traction;Ultrasound;DME Instruction;Gait training;Stair training;Functional mobility  training;Therapeutic activities;Therapeutic exercise;Balance training;Orthotic Fit/Training;Patient/family education;Neuromuscular re-education;Manual techniques;Dry needling;Energy conservation;Splinting;Spinal Manipulations;Joint Manipulations    PT Next Visit Plan begin core and hip strengthening, hip adductor stretches, hip mobility exercises, possibly manual for pain/mobility    PT Home Exercise Plan initiate next session    Consulted and Agree with Plan of Care Patient           Patient will benefit from skilled therapeutic intervention in order to improve the following deficits and impairments:  Abnormal gait, Decreased activity tolerance, Decreased balance, Decreased mobility, Decreased strength, Difficulty walking, Pain, Impaired flexibility, Improper body mechanics, Postural dysfunction  Visit Diagnosis: Low back pain, unspecified back pain laterality, unspecified chronicity, unspecified whether sciatica present  Pain in left hip  Muscle weakness (generalized)  Other abnormalities of gait and mobility  Other symptoms and signs involving the musculoskeletal system     Problem List Patient Active Problem List   Diagnosis Date Noted  . Malignant neoplasm of lower-inner quadrant of right breast of female, estrogen receptor positive ([place]) 06/12/2019  . Spondylolisthesis of lumbar region 02/03/2018  . Lumbar spondylosis 02/03/2018  . PSVT (paroxysmal supraventricular tachycardia) ([place]) 01/05/2012  . Near syncope 12/05/2011  . Palpitations 12/05/2011  . Abnormal ECG 12/05/2011  . HYPERLIPIDEMIA 11/10/2006  . Essential hypertension, benign 11/10/2006    1:51 PM, 09/08/20  Mearl Latin PT, DPT Physical Therapist at [facility] [place], [place], [address] Phone: [phone]   Fax:  [phone]  Name: Miranda [name] MRN: [id number] Date of Birth: Apr 23, 1934

## 2020-09-08 NOTE — Addendum Note (Signed)
Addended by: Mearl Latin on: 09/08/2020 01:52 PM   Modules accepted: Orders

## 2020-09-17 ENCOUNTER — Encounter (HOSPITAL_COMMUNITY): Payer: Self-pay | Admitting: Physical Therapy

## 2020-09-17 ENCOUNTER — Other Ambulatory Visit: Payer: Self-pay

## 2020-09-17 ENCOUNTER — Ambulatory Visit (HOSPITAL_COMMUNITY): Payer: Medicare Other | Admitting: Physical Therapy

## 2020-09-17 DIAGNOSIS — M25552 Pain in left hip: Secondary | ICD-10-CM | POA: Diagnosis not present

## 2020-09-17 DIAGNOSIS — R2689 Other abnormalities of gait and mobility: Secondary | ICD-10-CM | POA: Diagnosis not present

## 2020-09-17 DIAGNOSIS — M545 Low back pain, unspecified: Secondary | ICD-10-CM

## 2020-09-17 DIAGNOSIS — M6281 Muscle weakness (generalized): Secondary | ICD-10-CM | POA: Diagnosis not present

## 2020-09-17 DIAGNOSIS — R29898 Other symptoms and signs involving the musculoskeletal system: Secondary | ICD-10-CM | POA: Diagnosis not present

## 2020-09-17 NOTE — Therapy (Signed)
[place], [place], [address] Phone: [phone]   Fax:  [phone]  Physical Therapy Treatment  Patient Details  Name: Miranda [name] MRN: [id number] Date of Birth: 1934-02-11 Referring Provider (PT): Arther Abbott MD   Encounter Date: 09/17/2020   PT End of Session - 09/17/20 1528    Visit Number 2    Number of Visits 12    Date for PT Re-Evaluation 10/20/20    Authorization Type Primary Medicare Secondary BCBS No auth, vl 50 PT/OT/ST 0 used    Authorization - Visit Number 2    Authorization - Number of Visits 50    Progress Note Due on Visit 10    PT Start Time 0093    PT Stop Time 1610    PT Time Calculation (min) 40 min    Activity Tolerance Patient tolerated treatment well    Behavior During Therapy [facility] for tasks assessed/performed           Past Medical History:  Diagnosis Date  . Asthma   . Cancer ([place])   . Essential hypertension, benign   . Mixed hyperlipidemia   . Nephrolithiasis     Past Surgical History:  Procedure Laterality Date  . ABDOMINAL HYSTERECTOMY     excessive bleeding, no cancer   . BREAST LUMPECTOMY Right 2020  . Left knee arthroscopy due to torn ligament and cartiallege  2006   [facility]   . Left lung - lobectomy lower lobe - lung cancer    . RADIOACTIVE SEED GUIDED PARTIAL MASTECTOMY WITH AXILLARY SENTINEL LYMPH NODE BIOPSY Right 06/24/2019   Procedure: RADIOACTIVE SEED GUIDED RIGHT BREAST PARTIAL MASTECTOMY WITH  SENTINEL LYMPH NODE BIOPSY;  Surgeon: Coralie Keens, MD;  Location: [facility];  Service: General;  Laterality: Right;    There were no vitals filed for this visit.   Subjective Assessment - 09/17/20 1532    Subjective States the side of her leg is really hurting and low back. States it still feels like it is numb. Current pain level  is 8/10 with it mostly on side and on the back. States that last time the therapist touched a part of her back while she was  on her stomach and it hurt her for 3 days.    Limitations Walking;House hold activities;Standing    How long can you walk comfortably? 10 minutes    Patient Stated Goals decrease pain    Currently in Pain? Yes    Pain Score 8     Pain Location Back    Pain Orientation Left    Pain Descriptors / Indicators Aching    Pain Type Chronic pain              [facility] PT Assessment - 09/17/20 0001      Assessment   Medical Diagnosis Acute LBP with L sciatica    Referring Provider (PT) Arther Abbott MD    Onset Date/Surgical Date 08/10/20                         [facility] Adult PT Treatment/Exercise - 09/17/20 0001      Exercises   Exercises Lumbar      Lumbar Exercises: Stretches   Single Knee to Chest Stretch Left;Right   with strap x10 5" holds Each      Lumbar Exercises: Supine   Other Supine Lumbar Exercises butterfly stretch 2x10 5"holds     Other Supine Lumbar Exercises  bent knee fall ins 2x10 B ; hip abd isometric into belt x10 5" holds; hip add isometrics 3x10 5"       Lumbar Exercises: Quadruped   Other Quadruped Lumbar Exercises prone lying on pillow 3 minutes      Modalities   Modalities Moist Heat      Moist Heat Therapy   Number Minutes Moist Heat 15 Minutes    Moist Heat Location Lumbar Spine   while performing exercises                 PT Education - 09/17/20 1606    Education Details on importance of prone lying, anatomy of lumbar spine and hip. HEP    Person(s) Educated Patient    Methods Explanation    Comprehension Verbalized understanding            PT Short Term Goals - 09/08/20 1344      PT SHORT TERM GOAL #1   Title Patient will be independent with HEP in order to improve functional outcomes.    Time 3    Period Weeks    Status New    Target Date 09/29/20      PT SHORT TERM GOAL #2   Title Patient will report at least 25% improvement in symptoms for improved quality of life.    Time 3    Period Weeks    Status New     Target Date 09/29/20             PT Long Term Goals - 09/08/20 1345      PT LONG TERM GOAL #1   Title Patient will report at least 75% improvement in symptoms for improved quality of life.    Time 6    Period Weeks    Status New    Target Date 10/20/20      PT LONG TERM GOAL #2   Title Patient will improve FOTO score by at least 5 points in order to indicate improved tolerance to activity.    Time 6    Period Weeks    Status New    Target Date 10/20/20      PT LONG TERM GOAL #3   Title Patient will demonstrate at least 25% improvement in lumbar ROM in all restricted planes for improved ability to move trunk while at home.    Time 6    Period Weeks    Status New    Target Date 10/20/20                 Plan - 09/17/20 1621    Clinical Impression Statement Initially, focused on hip mobility. Increase in pain noted with butterfly stretch but decrease in pain n left knee fall ins. Tolerated isometrics well with reports of decreased pain afterwards. Transitioned to prone lying and patient tolerated this well. Educated patient on prone lying at home. 5/10 pain reported end of session. Will follow up with isometrics and prone lying next session, added these to HEP.    Personal Factors and Comorbidities Age;Comorbidity 3+;Fitness;Past/Current Experience    Comorbidities hx Back pain, BMI over 30, Cancer, High Blood Pressure    Examination-Activity Limitations Bed Mobility;Lift;Stand;Stairs;Squat;Sit;Locomotion Level;Transfers    Examination-Participation Restrictions Church;Cleaning;Community Activity;Yard Work;Volunteer;Shop    Stability/Clinical Decision Making Stable/Uncomplicated    Rehab Potential Good    PT Frequency 2x / week    PT Duration 6 weeks    PT Treatment/Interventions ADLs/Self Care Home Management;Aquatic Therapy;Biofeedback;Electrical Stimulation;Iontophoresis 4mg /ml Dexamethasone;Moist Heat;Traction;Ultrasound;DME Instruction;Gait  training;Stair  training;Functional mobility training;Therapeutic activities;Therapeutic exercise;Balance training;Orthotic Fit/Training;Patient/family education;Neuromuscular re-education;Manual techniques;Dry needling;Energy conservation;Splinting;Spinal Manipulations;Joint Manipulations    PT Next Visit Plan begin core and hip strengthening, hip adductor stretches, hip mobility exercises, possibly manual for pain/mobility    PT Home Exercise Plan hip add isometrics, prone lying with one pillow under pelvis    Consulted and Agree with Plan of Care Patient           Patient will benefit from skilled therapeutic intervention in order to improve the following deficits and impairments:  Abnormal gait, Decreased activity tolerance, Decreased balance, Decreased mobility, Decreased strength, Difficulty walking, Pain, Impaired flexibility, Improper body mechanics, Postural dysfunction  Visit Diagnosis: Low back pain, unspecified back pain laterality, unspecified chronicity, unspecified whether sciatica present  Pain in left hip  Muscle weakness (generalized)  Other abnormalities of gait and mobility     Problem List Patient Active Problem List   Diagnosis Date Noted  . Malignant neoplasm of lower-inner quadrant of right breast of female, estrogen receptor positive ([place]) 06/12/2019  . Spondylolisthesis of lumbar region 02/03/2018  . Lumbar spondylosis 02/03/2018  . PSVT (paroxysmal supraventricular tachycardia) ([place]) 01/05/2012  . Near syncope 12/05/2011  . Palpitations 12/05/2011  . Abnormal ECG 12/05/2011  . HYPERLIPIDEMIA 11/10/2006  . Essential hypertension, benign 11/10/2006   4:22 PM, 09/17/20 Jerene Pitch, DPT Physical Therapy with [facility]  [phone] office  [place] [address] [place], [place], [address] Phone: [phone]   Fax:  [phone]  Name: Miranda [name] MRN: [id number] Date of Birth:  October 10, 1934

## 2020-09-20 ENCOUNTER — Other Ambulatory Visit: Payer: Self-pay

## 2020-09-20 ENCOUNTER — Encounter (HOSPITAL_COMMUNITY): Payer: Self-pay

## 2020-09-20 ENCOUNTER — Ambulatory Visit (HOSPITAL_COMMUNITY): Payer: Medicare Other

## 2020-09-20 DIAGNOSIS — M6281 Muscle weakness (generalized): Secondary | ICD-10-CM | POA: Diagnosis not present

## 2020-09-20 DIAGNOSIS — M545 Low back pain, unspecified: Secondary | ICD-10-CM | POA: Diagnosis not present

## 2020-09-20 DIAGNOSIS — M25552 Pain in left hip: Secondary | ICD-10-CM | POA: Diagnosis not present

## 2020-09-20 DIAGNOSIS — R29898 Other symptoms and signs involving the musculoskeletal system: Secondary | ICD-10-CM

## 2020-09-20 DIAGNOSIS — R2689 Other abnormalities of gait and mobility: Secondary | ICD-10-CM | POA: Diagnosis not present

## 2020-09-20 DIAGNOSIS — Z23 Encounter for immunization: Secondary | ICD-10-CM | POA: Diagnosis not present

## 2020-09-20 NOTE — Therapy (Signed)
[facility] [place], [place], [address] Phone: [phone]   Fax:  [phone]  Physical Therapy Treatment  Patient Details  Name: Miranda [name] MRN: [id number] Date of Birth: [birth year] Referring Provider (PT): Arther Abbott MD   Encounter Date: 09/20/2020   PT End of Session - 09/20/20 0847    Visit Number 3    Number of Visits 12    Date for PT Re-Evaluation 10/20/20    Authorization Type Primary Medicare Secondary BCBS No auth, vl 50 PT/OT/ST 0 used    Authorization - Visit Number 3    Authorization - Number of Visits 50    Progress Note Due on Visit 10    PT Start Time 0831    PT Stop Time 0915    PT Time Calculation (min) 44 min    Activity Tolerance Patient tolerated treatment well    Behavior During Therapy [facility] for tasks assessed/performed           Past Medical History:  Diagnosis Date  . Asthma   . Cancer ([place])   . Essential hypertension, benign   . Mixed hyperlipidemia   . Nephrolithiasis     Past Surgical History:  Procedure Laterality Date  . ABDOMINAL HYSTERECTOMY     excessive bleeding, no cancer   . BREAST LUMPECTOMY Right 2020  . Left knee arthroscopy due to torn ligament and cartiallege  2006   [facility]   . Left lung - lobectomy lower lobe - lung cancer    . RADIOACTIVE SEED GUIDED PARTIAL MASTECTOMY WITH AXILLARY SENTINEL LYMPH NODE BIOPSY Right 06/24/2019   Procedure: RADIOACTIVE SEED GUIDED RIGHT BREAST PARTIAL MASTECTOMY WITH  SENTINEL LYMPH NODE BIOPSY;  Surgeon: Coralie Keens, MD;  Location: [facility];  Service: General;  Laterality: Right;    There were no vitals filed for this visit.   Subjective Assessment - 09/20/20 0835    Subjective Pt reports she is feeling a little better in her back and Lt lateral leg stopping at knee.  Current pain scale 6/10 today.  Reports she has began the isometric adduction with ball and prone wiht pillow under hips.    Patient Stated Goals  decrease pain    Currently in Pain? Yes    Pain Score 6     Pain Location Back    Pain Orientation Lower;Left    Pain Descriptors / Indicators Aching;Tender    Pain Type Chronic pain    Pain Onset More than a month ago    Pain Frequency Constant    Aggravating Factors  unsure    Pain Relieving Factors MHP, stretch    Effect of Pain on Daily Activities somewhat, limited ability to stand                             [facility] Adult PT Treatment/Exercise - 09/20/20 0001      Lumbar Exercises: Stretches   Single Knee to Chest Stretch 2 reps;30 seconds    Lower Trunk Rotation 5 reps;10 seconds    Prone on Elbows Stretch 1 rep    Prone on Elbows Stretch Limitations 2 minutes    Figure 4 Stretch 2 reps;30 seconds;With overpressure;Supine    Other Lumbar Stretch Exercise butterfly 2x 30"      Lumbar Exercises: Supine   Ab Set 10 reps;5 seconds    AB Set Limitations paired wiht exhale    Bent Knee Raise 10 reps;3 seconds  Bent Knee Raise Limitations with ab set    Other Supine Lumbar Exercises bent knee fall ins 2x10 B ; hip abd isometric into belt x10 5" holds; hip add isometrics 3x10 5"       Lumbar Exercises: Quadruped   Other Quadruped Lumbar Exercises heel squeeze 5x 5"      Modalities   Modalities Moist Heat      Moist Heat Therapy   Number Minutes Moist Heat 15 Minutes    Moist Heat Location Lumbar Spine   During supine exercises                   PT Short Term Goals - 09/08/20 1344      PT SHORT TERM GOAL #1   Title Patient will be independent with HEP in order to improve functional outcomes.    Time 3    Period Weeks    Status New    Target Date 09/29/20      PT SHORT TERM GOAL #2   Title Patient will report at least 25% improvement in symptoms for improved quality of life.    Time 3    Period Weeks    Status New    Target Date 09/29/20             PT Long Term Goals - 09/08/20 1345      PT LONG TERM GOAL #1   Title Patient  will report at least 75% improvement in symptoms for improved quality of life.    Time 6    Period Weeks    Status New    Target Date 10/20/20      PT LONG TERM GOAL #2   Title Patient will improve FOTO score by at least 5 points in order to indicate improved tolerance to activity.    Time 6    Period Weeks    Status New    Target Date 10/20/20      PT LONG TERM GOAL #3   Title Patient will demonstrate at least 25% improvement in lumbar ROM in all restricted planes for improved ability to move trunk while at home.    Time 6    Period Weeks    Status New    Target Date 10/20/20                 Plan - 09/20/20 0857    Clinical Impression Statement Session focus on core activation and hip mobility.  Reviewed current HEP, pt able to recall and demonstrate with good form.  Educated on abdominal activation, paired with exhale to reduce holding breath during contraction.  MHP used during supine exercises with reports of relief.  EOS reports lateral Lt leg symptoms resolved, reports of pain centralized Lt gluteal region.    Personal Factors and Comorbidities Age;Comorbidity 3+;Fitness;Past/Current Experience    Comorbidities hx Back pain, BMI over 30, Cancer, High Blood Pressure    Examination-Activity Limitations Bed Mobility;Lift;Stand;Stairs;Squat;Sit;Locomotion Level;Transfers    Stability/Clinical Decision Making Stable/Uncomplicated    Clinical Decision Making Low    Rehab Potential Good    PT Frequency 2x / week    PT Duration 6 weeks    PT Treatment/Interventions ADLs/Self Care Home Management;Aquatic Therapy;Biofeedback;Electrical Stimulation;Iontophoresis 4mg /ml Dexamethasone;Moist Heat;Traction;Ultrasound;DME Instruction;Gait training;Stair training;Functional mobility training;Therapeutic activities;Therapeutic exercise;Balance training;Orthotic Fit/Training;Patient/family education;Neuromuscular re-education;Manual techniques;Dry needling;Energy  conservation;Splinting;Spinal Manipulations;Joint Manipulations    PT Next Visit Plan begin core and hip strengthening, hip adductor stretches, hip mobility exercises, possibly manual for pain/mobility    PT Home  Exercise Plan hip add isometrics, prone lying with one pillow under pelvis           Patient will benefit from skilled therapeutic intervention in order to improve the following deficits and impairments:  Abnormal gait, Decreased activity tolerance, Decreased balance, Decreased mobility, Decreased strength, Difficulty walking, Pain, Impaired flexibility, Improper body mechanics, Postural dysfunction  Visit Diagnosis: Low back pain, unspecified back pain laterality, unspecified chronicity, unspecified whether sciatica present  Pain in left hip  Muscle weakness (generalized)  Other abnormalities of gait and mobility  Other symptoms and signs involving the musculoskeletal system     Problem List Patient Active Problem List   Diagnosis Date Noted  . Malignant neoplasm of lower-inner quadrant of right breast of female, estrogen receptor positive ([place]) 06/12/2019  . Spondylolisthesis of lumbar region 02/03/2018  . Lumbar spondylosis 02/03/2018  . PSVT (paroxysmal supraventricular tachycardia) ([place]) 01/05/2012  . Near syncope 12/05/2011  . Palpitations 12/05/2011  . Abnormal ECG 12/05/2011  . HYPERLIPIDEMIA 11/10/2006  . Essential hypertension, benign 11/10/2006   Ihor Austin, LPTA/CLT; CBIS [phone]  Aldona Lento 09/20/2020, 10:12 AM  [facility] [address] [place], [place], [address] Phone: [phone]   Fax:  [phone]  Name: Miranda [name] MRN: [id number] Date of Birth: [birth year]

## 2020-09-22 ENCOUNTER — Encounter (HOSPITAL_COMMUNITY): Payer: Medicare Other | Admitting: Physical Therapy

## 2020-09-27 ENCOUNTER — Encounter (HOSPITAL_COMMUNITY): Payer: Self-pay

## 2020-09-27 ENCOUNTER — Other Ambulatory Visit: Payer: Self-pay

## 2020-09-27 ENCOUNTER — Ambulatory Visit (HOSPITAL_COMMUNITY): Payer: Medicare Other | Attending: Orthopedic Surgery

## 2020-09-27 DIAGNOSIS — M25552 Pain in left hip: Secondary | ICD-10-CM | POA: Insufficient documentation

## 2020-09-27 DIAGNOSIS — R2689 Other abnormalities of gait and mobility: Secondary | ICD-10-CM | POA: Insufficient documentation

## 2020-09-27 DIAGNOSIS — R29898 Other symptoms and signs involving the musculoskeletal system: Secondary | ICD-10-CM | POA: Insufficient documentation

## 2020-09-27 DIAGNOSIS — M6281 Muscle weakness (generalized): Secondary | ICD-10-CM | POA: Diagnosis not present

## 2020-09-27 DIAGNOSIS — M545 Low back pain, unspecified: Secondary | ICD-10-CM | POA: Insufficient documentation

## 2020-09-27 NOTE — Patient Instructions (Addendum)
Flexors, Supine Bridge    Lie supine, feet shoulder-width apart. Lift hips toward ceiling. Hold _3-5__ seconds. Repeat 10-15___ times per session. Do 1_ sessions per day.  Copyright  VHI. All rights reserved.   On Elbows (Prone)    Rise up on elbows as high as possible, keeping hips on floor. Hold _2_ minutes. Repeat _1___ times per set. Do __1__ sets per session. Do _3___ sessions per day.  http://orth.exer.us/93   Copyright  VHI. All rights reserved.

## 2020-09-27 NOTE — Therapy (Signed)
[facility], [place], [address] Phone: [phone]   Fax:  [phone]  Physical Therapy Treatment  Patient Details  Name: Miranda [name] MRN: [id number] Date of Birth: Jun 17, 1934 Referring Provider (PT): Arther Abbott MD   Encounter Date: 09/27/2020   PT End of Session - 09/27/20 1104    Visit Number 4    Number of Visits 12    Date for PT Re-Evaluation 10/20/20    Authorization Type Primary Medicare Secondary BCBS No auth, vl 50 PT/OT/ST 0 used    Authorization - Visit Number 4    Authorization - Number of Visits 50    Progress Note Due on Visit 10    PT Start Time 1026    PT Stop Time 1107    PT Time Calculation (min) 41 min    Activity Tolerance Patient tolerated treatment well    Behavior During Therapy WFL for tasks assessed/performed           Past Medical History:  Diagnosis Date  . Asthma   . Cancer ([place])   . Essential hypertension, benign   . Mixed hyperlipidemia   . Nephrolithiasis     Past Surgical History:  Procedure Laterality Date  . ABDOMINAL HYSTERECTOMY     excessive bleeding, no cancer   . BREAST LUMPECTOMY Right 2020  . Left knee arthroscopy due to torn ligament and cartiallege  2006   [facility]   . Left lung - lobectomy lower lobe - lung cancer    . RADIOACTIVE SEED GUIDED PARTIAL MASTECTOMY WITH AXILLARY SENTINEL LYMPH NODE BIOPSY Right 06/24/2019   Procedure: RADIOACTIVE SEED GUIDED RIGHT BREAST PARTIAL MASTECTOMY WITH  SENTINEL LYMPH NODE BIOPSY;  Surgeon: Coralie Keens, MD;  Location: [facility];  Service: General;  Laterality: Right;    There were no vitals filed for this visit.   Subjective Assessment - 09/27/20 1025    Subjective Pt continues to report she is feeling a little better in her back and Lt lateral leg stopping at thigh.  Pain in right thigh after she sits a while and then stands to get up and walk. Current pain scale 6/10 today. HEP going well, stopped  exercise with knees out to side because too much stretch.    Patient Stated Goals decrease pain    Pain Onset More than a month ago            [facility] Adult PT Treatment/Exercise - 09/27/20 0001      Lumbar Exercises: Stretches   Single Knee to Chest Stretch 2 reps;30 seconds    Lower Trunk Rotation 5 reps;10 seconds    Prone on Elbows Stretch 1 rep    Prone on Elbows Stretch Limitations 2 minutes    Figure 4 Stretch 2 reps;30 seconds;With overpressure;Supine    Other Lumbar Stretch Exercise butterfly 2x 30"      Lumbar Exercises: Supine   Ab Set 10 reps;5 seconds    AB Set Limitations paired with exhale/inhale    Bent Knee Raise 10 reps;3 seconds    Bent Knee Raise Limitations with ab set    Bridge 10 reps;3 seconds    Bridge Limitations with ab set    Other Supine Lumbar Exercises bent knee fall ins 2x10 B ; hip abd isometric into belt 2x10 5" holds; hip add isometrics 2x10 5"       Moist Heat Therapy   Number Minutes Moist Heat 15 Minutes    Moist Heat Location Lumbar  Spine   During supine exercises             PT Education - 09/27/20 1033    Education Details Discussed purpose and technique of interventions throughout session. Advanced HEP. Discussed butterfly stretch technique with decreased pain.    Person(s) Educated Patient    Methods Explanation;Handout    Comprehension Verbalized understanding            PT Short Term Goals - 09/27/20 1107      PT SHORT TERM GOAL #1   Title Patient will be independent with HEP in order to improve functional outcomes.    Time 3    Period Weeks    Status On-going    Target Date 09/29/20      PT SHORT TERM GOAL #2   Title Patient will report at least 25% improvement in symptoms for improved quality of life.    Time 3    Period Weeks    Status On-going    Target Date 09/29/20             PT Long Term Goals - 09/27/20 1107      PT LONG TERM GOAL #1   Title Patient will report at least 75% improvement in  symptoms for improved quality of life.    Time 6    Period Weeks    Status On-going      PT LONG TERM GOAL #2   Title Patient will improve FOTO score by at least 5 points in order to indicate improved tolerance to activity.    Time 6    Period Weeks    Status On-going      PT LONG TERM GOAL #3   Title Patient will demonstrate at least 25% improvement in lumbar ROM in all restricted planes for improved ability to move trunk while at home.    Time 6    Period Weeks    Status On-going             Plan - 09/27/20 1107    Clinical Impression Statement Session focused on continued core activiation, hip mobility and pain reduction. Moist hot pack used during supine exercises as patient reported that did decrease her pain level last session. Reviewed current HEP added supine bridges and prone on elbows to HEP with patient's ability to demonstrate HEP with good form. Patient reported decreased pain at end of session immediately after prone on elbows exercise. Patient's leg pain down to mid thigh today with greatest complaints in L5 region described as stiffness and dull. Patient will continue to benefit from skilled physical therapy in order to improve function, decrease pain and reduce impairment.    Personal Factors and Comorbidities Age;Comorbidity 3+;Fitness;Past/Current Experience    Comorbidities hx Back pain, BMI over 30, Cancer, High Blood Pressure    Examination-Activity Limitations Bed Mobility;Lift;Stand;Stairs;Squat;Sit;Locomotion Level;Transfers    Stability/Clinical Decision Making Stable/Uncomplicated    Rehab Potential Good    PT Frequency 2x / week    PT Duration 6 weeks    PT Treatment/Interventions ADLs/Self Care Home Management;Aquatic Therapy;Biofeedback;Electrical Stimulation;Iontophoresis 4mg /ml Dexamethasone;Moist Heat;Traction;Ultrasound;DME Instruction;Gait training;Stair training;Functional mobility training;Therapeutic activities;Therapeutic exercise;Balance  training;Orthotic Fit/Training;Patient/family education;Neuromuscular re-education;Manual techniques;Dry needling;Energy conservation;Splinting;Spinal Manipulations;Joint Manipulations    PT Next Visit Plan begin core and hip strengthening, hip adductor stretches, hip mobility exercises, possibly manual for pain/mobility    PT Home Exercise Plan hip add isometrics, prone lying with one pillow under pelvis; 09/27/20 - supine bridges; prone on elbows  Patient will benefit from skilled therapeutic intervention in order to improve the following deficits and impairments:  Abnormal gait, Decreased activity tolerance, Decreased balance, Decreased mobility, Decreased strength, Difficulty walking, Pain, Impaired flexibility, Improper body mechanics, Postural dysfunction  Visit Diagnosis: Low back pain, unspecified back pain laterality, unspecified chronicity, unspecified whether sciatica present  Pain in left hip  Muscle weakness (generalized)  Other abnormalities of gait and mobility  Other symptoms and signs involving the musculoskeletal system     Problem List Patient Active Problem List   Diagnosis Date Noted  . Malignant neoplasm of lower-inner quadrant of right breast of female, estrogen receptor positive ([place]) 06/12/2019  . Spondylolisthesis of lumbar region 02/03/2018  . Lumbar spondylosis 02/03/2018  . PSVT (paroxysmal supraventricular tachycardia) ([place]) 01/05/2012  . Near syncope 12/05/2011  . Palpitations 12/05/2011  . Abnormal ECG 12/05/2011  . HYPERLIPIDEMIA 11/10/2006  . Essential hypertension, benign 11/10/2006    Floria Raveling. Hartnett-Rands, MS, PT [facility] #[id number] 09/27/2020, 12:22 PM  [facility] [address] [place], [place], [address] Phone: [phone]   Fax:  [phone]  Name: Miranda [name] MRN: [id number] Date of Birth: Jun 21, 1934

## 2020-09-29 ENCOUNTER — Encounter (HOSPITAL_COMMUNITY): Payer: Medicare Other

## 2020-09-30 ENCOUNTER — Ambulatory Visit (HOSPITAL_COMMUNITY): Payer: Medicare Other

## 2020-09-30 ENCOUNTER — Ambulatory Visit (INDEPENDENT_AMBULATORY_CARE_PROVIDER_SITE_OTHER): Payer: Medicare Other | Admitting: Orthopedic Surgery

## 2020-09-30 ENCOUNTER — Encounter (HOSPITAL_COMMUNITY): Payer: Self-pay

## 2020-09-30 ENCOUNTER — Encounter: Payer: Self-pay | Admitting: Orthopedic Surgery

## 2020-09-30 ENCOUNTER — Other Ambulatory Visit: Payer: Self-pay

## 2020-09-30 VITALS — BP 161/89 | HR 70 | Ht 61.0 in | Wt 158.0 lb

## 2020-09-30 DIAGNOSIS — R29898 Other symptoms and signs involving the musculoskeletal system: Secondary | ICD-10-CM

## 2020-09-30 DIAGNOSIS — R2689 Other abnormalities of gait and mobility: Secondary | ICD-10-CM | POA: Diagnosis not present

## 2020-09-30 DIAGNOSIS — M545 Low back pain, unspecified: Secondary | ICD-10-CM

## 2020-09-30 DIAGNOSIS — M6281 Muscle weakness (generalized): Secondary | ICD-10-CM | POA: Diagnosis not present

## 2020-09-30 DIAGNOSIS — M25552 Pain in left hip: Secondary | ICD-10-CM

## 2020-09-30 DIAGNOSIS — M5442 Lumbago with sciatica, left side: Secondary | ICD-10-CM | POA: Diagnosis not present

## 2020-09-30 NOTE — Progress Notes (Signed)
Chief Complaint  Patient presents with  . Back Pain    back is okay but has leg pain right / burning left thigh and is leaning forward to ambulate     86 year old female followed for lower back pain and some left buttock pain is getting better overall on Advil and Neurontin she is had a couple therapy visits  She notes that she has some new onset pain in the right thigh when she gets up but it goes away as she is walking, that started 3 to 4 days ago  The tingling in the left buttock and left leg is getting better though still present is relieved with the Advil and gabapentin  BP (!) 161/89   Pulse 70   Ht 5\' 1"  (1.549 m)   Wt 158 lb (71.7 kg)   BMI 29.85 kg/m   I tested both of her lower extremities again for strength deficits she has 5 out of 5 strength L2-S1   Recommend continue physical therapy   Continue Advil   Continue Neurontin recheck 8 weeks

## 2020-09-30 NOTE — Patient Instructions (Signed)
Continue therapy and current medications

## 2020-09-30 NOTE — Therapy (Signed)
[place], [place], [address] Phone: [phone]   Fax:  [phone]  Physical Therapy Treatment  Patient Details  Name: Miranda [name] MRN: [id number] Date of Birth: 1934/03/12 Referring Provider (PT): Arther Abbott MD   Encounter Date: 09/30/2020   PT End of Session - 09/30/20 1417    Visit Number 5    Number of Visits 12    Date for PT Re-Evaluation 10/20/20    Authorization Type Primary Medicare Secondary BCBS No auth, vl 50 PT/OT/ST 0 used    Authorization - Visit Number 5    Authorization - Number of Visits 50    Progress Note Due on Visit 10    PT Start Time 6384    PT Stop Time 5364    PT Time Calculation (min) 39 min    Activity Tolerance Patient tolerated treatment well    Behavior During Therapy WFL for tasks assessed/performed           Past Medical History:  Diagnosis Date   Asthma    Cancer ([place])    Essential hypertension, benign    Mixed hyperlipidemia    Nephrolithiasis     Past Surgical History:  Procedure Laterality Date   ABDOMINAL HYSTERECTOMY     excessive bleeding, no cancer    BREAST LUMPECTOMY Right 2020   Left knee arthroscopy due to torn ligament and cartiallege  2006   [facility]    Left lung - lobectomy lower lobe - lung cancer     RADIOACTIVE SEED GUIDED PARTIAL MASTECTOMY WITH AXILLARY SENTINEL LYMPH NODE BIOPSY Right 06/24/2019   Procedure: RADIOACTIVE SEED GUIDED RIGHT BREAST PARTIAL MASTECTOMY WITH  SENTINEL LYMPH NODE BIOPSY;  Surgeon: Coralie Keens, MD;  Location: [facility];  Service: General;  Laterality: Right;    There were no vitals filed for this visit.   Subjective Assessment - 09/30/20 1411    Subjective Pt reports her pain on Rt thigh/inner thigh sharp intermittent pain that reduces with walking.  Lt thigh burning/tingling pain scale 7/10.  Reports she feels better though does have the constant pain,    Patient Stated Goals decrease pain     Currently in Pain? Yes    Pain Score 7     Pain Location Leg    Pain Orientation Right;Left;Proximal;Posterior;Medial   Lt: burning/tingling, Rt: sharp   Pain Descriptors / Indicators Burning;Tightness;Sharp    Pain Type Chronic pain    Pain Onset More than a month ago    Pain Frequency Constant    Aggravating Factors  unsure    Pain Relieving Factors MHP, stretch    Effect of Pain on Daily Activities somewhat, limited ability to stand              [facility] PT Assessment - 09/30/20 0001      Assessment   Medical Diagnosis Acute LBP with L sciatica    Referring Provider (PT) Arther Abbott MD    Onset Date/Surgical Date 08/10/20    Next MD Visit 11/03/20    Prior Therapy yes for back      Precautions   Precautions None                         [facility] Adult PT Treatment/Exercise - 09/30/20 0001      Posture/Postural Control   Posture/Postural Control Postural limitations    Postural Limitations Rounded Shoulders;Forward head      Lumbar Exercises: Stretches  Prone on Elbows Stretch Limitations 2 minutes    Press Ups 5 reps;5 seconds    Press Ups Limitations limited extension, weak UE    Other Lumbar Stretch Exercise butterfly 2x 30"      Lumbar Exercises: Standing   Other Standing Lumbar Exercises Postural education with back against wall 2x 1 min       Lumbar Exercises: Seated   Sit to Stand 5 reps    Sit to Stand Limitations eccentric control    Other Seated Lumbar Exercises Sitting tall 2x 1 minl; scapular retraction    Other Seated Lumbar Exercises Sitting tall, isometric adduction ball 10x 5"      Lumbar Exercises: Supine   Bridge 10 reps;3 seconds      Lumbar Exercises: Quadruped   Straight Leg Raise 5 reps;3 seconds    Straight Leg Raises Limitations 2 sets      Modalities   Modalities Moist Heat      Moist Heat Therapy   Number Minutes Moist Heat 15 Minutes    Moist Heat Location Lumbar Spine   supine exercises                  PT Education - 09/30/20 1414    Education Details Educated benefits with posture to reduce shorting of leg musculature and reduce strain on lower back    Person(s) Educated Patient    Methods Explanation    Comprehension Verbalized understanding;Returned demonstration            PT Short Term Goals - 09/27/20 1107      PT SHORT TERM GOAL #1   Title Patient will be independent with HEP in order to improve functional outcomes.    Time 3    Period Weeks    Status On-going    Target Date 09/29/20      PT SHORT TERM GOAL #2   Title Patient will report at least 25% improvement in symptoms for improved quality of life.    Time 3    Period Weeks    Status On-going    Target Date 09/29/20             PT Long Term Goals - 09/27/20 1107      PT LONG TERM GOAL #1   Title Patient will report at least 75% improvement in symptoms for improved quality of life.    Time 6    Period Weeks    Status On-going      PT LONG TERM GOAL #2   Title Patient will improve FOTO score by at least 5 points in order to indicate improved tolerance to activity.    Time 6    Period Weeks    Status On-going      PT LONG TERM GOAL #3   Title Patient will demonstrate at least 25% improvement in lumbar ROM in all restricted planes for improved ability to move trunk while at home.    Time 6    Period Weeks    Status On-going                 Plan - 09/30/20 1433    Clinical Impression Statement Pt tolerated well towards session.  Reviewed newest HEP wiht ability to demonstrate good form and mechanics with bridges.  Added prone press-ups for lumbar extension and educated importance of posture to reduce strain and improve musculature lengthening.  No reports of change in pain with different positions wiht Lt inner thigh tingling symtoms,  able to complete STS with no reports of Rt LE pain.  No reports of increased pain through session.    Personal Factors and Comorbidities  Age;Comorbidity 3+;Fitness;Past/Current Experience    Comorbidities hx Back pain, BMI over 30, Cancer, High Blood Pressure    Examination-Activity Limitations Bed Mobility;Lift;Stand;Stairs;Squat;Sit;Locomotion Level;Transfers    Examination-Participation Restrictions Church;Cleaning;Community Activity;Yard Work;Volunteer;Shop    Stability/Clinical Decision Making Stable/Uncomplicated    Clinical Decision Making Low    Rehab Potential Good    PT Frequency 2x / week    PT Duration 6 weeks    PT Treatment/Interventions ADLs/Self Care Home Management;Aquatic Therapy;Biofeedback;Electrical Stimulation;Iontophoresis 4mg /ml Dexamethasone;Moist Heat;Traction;Ultrasound;DME Instruction;Gait training;Stair training;Functional mobility training;Therapeutic activities;Therapeutic exercise;Balance training;Orthotic Fit/Training;Patient/family education;Neuromuscular re-education;Manual techniques;Dry needling;Energy conservation;Splinting;Spinal Manipulations;Joint Manipulations    PT Next Visit Plan begin core and hip strengthening, hip adductor stretches, hip mobility exercises, possibly manual for pain/mobility    PT Home Exercise Plan hip add isometrics, prone lying with one pillow under pelvis; 09/27/20 - supine bridges; prone on elbows           Patient will benefit from skilled therapeutic intervention in order to improve the following deficits and impairments:  Abnormal gait, Decreased activity tolerance, Decreased balance, Decreased mobility, Decreased strength, Difficulty walking, Pain, Impaired flexibility, Improper body mechanics, Postural dysfunction  Visit Diagnosis: Pain in left hip  Muscle weakness (generalized)  Other abnormalities of gait and mobility  Other symptoms and signs involving the musculoskeletal system  Low back pain, unspecified back pain laterality, unspecified chronicity, unspecified whether sciatica present     Problem List Patient Active Problem List    Diagnosis Date Noted   Malignant neoplasm of lower-inner quadrant of right breast of female, estrogen receptor positive ([place]) 06/12/2019   Spondylolisthesis of lumbar region 02/03/2018   Lumbar spondylosis 02/03/2018   PSVT (paroxysmal supraventricular tachycardia) ([facility]) 01/05/2012   Near syncope 12/05/2011   Palpitations 12/05/2011   Abnormal ECG 12/05/2011   HYPERLIPIDEMIA 11/10/2006   Essential hypertension, benign 11/10/2006   Ihor Austin, LPTA/CLT; CBIS [phone]  Aldona Lento 09/30/2020, 3:33 PM  [place], [place], [address] Phone: [phone]   Fax:  [phone]  Name: Miranda [name] MRN: [id number] Date of Birth: [birth year]

## 2020-10-04 ENCOUNTER — Other Ambulatory Visit: Payer: Self-pay

## 2020-10-04 ENCOUNTER — Encounter (HOSPITAL_COMMUNITY): Payer: Self-pay | Admitting: Physical Therapy

## 2020-10-04 ENCOUNTER — Ambulatory Visit (HOSPITAL_COMMUNITY): Payer: Medicare Other | Admitting: Physical Therapy

## 2020-10-04 DIAGNOSIS — M25552 Pain in left hip: Secondary | ICD-10-CM | POA: Diagnosis not present

## 2020-10-04 DIAGNOSIS — M6281 Muscle weakness (generalized): Secondary | ICD-10-CM

## 2020-10-04 DIAGNOSIS — M545 Low back pain, unspecified: Secondary | ICD-10-CM | POA: Diagnosis not present

## 2020-10-04 DIAGNOSIS — R2689 Other abnormalities of gait and mobility: Secondary | ICD-10-CM | POA: Diagnosis not present

## 2020-10-04 DIAGNOSIS — R29898 Other symptoms and signs involving the musculoskeletal system: Secondary | ICD-10-CM

## 2020-10-04 NOTE — Therapy (Signed)
[place], [place], [address] Phone: [phone]   Fax:  [phone]  Physical Therapy Treatment  Patient Details  Name: Miranda [name] MRN: [id number] Date of Birth: 03-21-1934 Referring Provider (PT): Arther Abbott MD   Encounter Date: 10/04/2020   PT End of Session - 10/04/20 0957    Visit Number 6    Number of Visits 12    Date for PT Re-Evaluation 10/20/20    Authorization Type Primary Medicare Secondary BCBS No auth, vl 50 PT/OT/ST 0 used    Authorization - Visit Number 6    Authorization - Number of Visits 50    Progress Note Due on Visit 10    PT Start Time 1000    PT Stop Time 1038    PT Time Calculation (min) 38 min    Activity Tolerance Patient tolerated treatment well    Behavior During Therapy [facility] for tasks assessed/performed           Past Medical History:  Diagnosis Date  . Asthma   . Cancer ([place])   . Essential hypertension, benign   . Mixed hyperlipidemia   . Nephrolithiasis     Past Surgical History:  Procedure Laterality Date  . ABDOMINAL HYSTERECTOMY     excessive bleeding, no cancer   . BREAST LUMPECTOMY Right 2020  . Left knee arthroscopy due to torn ligament and cartiallege  2006   [facility]   . Left lung - lobectomy lower lobe - lung cancer    . RADIOACTIVE SEED GUIDED PARTIAL MASTECTOMY WITH AXILLARY SENTINEL LYMPH NODE BIOPSY Right 06/24/2019   Procedure: RADIOACTIVE SEED GUIDED RIGHT BREAST PARTIAL MASTECTOMY WITH  SENTINEL LYMPH NODE BIOPSY;  Surgeon: Coralie Keens, MD;  Location: [place];  Service: General;  Laterality: Right;    There were no vitals filed for this visit.   Subjective Assessment - 10/04/20 0958    Subjective Patient states she has been doing better. She sometimes has leg pain in R Leg with sitting too much. She is still having some burning in lateral left leg which is constant but gradually going away.    Patient Stated Goals decrease pain     Currently in Pain? Yes    Pain Score 4     Pain Location Leg    Pain Orientation Left;Lateral    Pain Descriptors / Indicators Burning    Pain Onset More than a month ago                             [facility] Adult PT Treatment/Exercise - 10/04/20 0001      Lumbar Exercises: Stretches   Piriformis Stretch Left;3 reps;30 seconds      Lumbar Exercises: Standing   Other Standing Lumbar Exercises standing marches with cueing for glute activation 2x 10 bilateral     Other Standing Lumbar Exercises Palof 2x 10 bilateral red band      Lumbar Exercises: Sidelying   Clam 10 reps;Left;3 seconds    Clam Limitations 2 sets      Lumbar Exercises: Prone   Straight Leg Raise 10 reps    Straight Leg Raises Limitations 2 sets bilateral                   PT Education - 10/04/20 0956    Education Details Patient educated on HEP, exercise mechanics    Person(s) Educated Patient    Methods Explanation;Demonstration  Comprehension Verbalized understanding;Returned demonstration            PT Short Term Goals - 09/27/20 1107      PT SHORT TERM GOAL #1   Title Patient will be independent with HEP in order to improve functional outcomes.    Time 3    Period Weeks    Status On-going    Target Date 09/29/20      PT SHORT TERM GOAL #2   Title Patient will report at least 25% improvement in symptoms for improved quality of life.    Time 3    Period Weeks    Status On-going    Target Date 09/29/20             PT Long Term Goals - 09/27/20 1107      PT LONG TERM GOAL #1   Title Patient will report at least 75% improvement in symptoms for improved quality of life.    Time 6    Period Weeks    Status On-going      PT LONG TERM GOAL #2   Title Patient will improve FOTO score by at least 5 points in order to indicate improved tolerance to activity.    Time 6    Period Weeks    Status On-going      PT LONG TERM GOAL #3   Title Patient will demonstrate at  least 25% improvement in lumbar ROM in all restricted planes for improved ability to move trunk while at home.    Time 6    Period Weeks    Status On-going                 Plan - 10/04/20 0957    Clinical Impression Statement Patient tolerates piriformis stretch in supine with no increase in symptoms. Patient states no symptoms after transitioning back to standing or with ambulating in clinic. She requires some verbal cueing for limiting excessive trunk motion with hip strengthening. Patient states slight burning in LLE laterally following which reduces with supine piriformis stretch. Patient given cueing for glute activation with marching exercise and requires unilateral UE support for mechanics and balance. Patient has slight pulling/stretching in L glute with marching and palof press exercise which decreases but still present following. Patient will continue to benefit from skilled physical therapy in order to reduce impairment and improve function.    Personal Factors and Comorbidities Age;Comorbidity 3+;Fitness;Past/Current Experience    Comorbidities hx Back pain, BMI over 30, Cancer, High Blood Pressure    Examination-Activity Limitations Bed Mobility;Lift;Stand;Stairs;Squat;Sit;Locomotion Level;Transfers    Examination-Participation Restrictions Church;Cleaning;Community Activity;Yard Work;Volunteer;Shop    Stability/Clinical Decision Making Stable/Uncomplicated    Rehab Potential Good    PT Frequency 2x / week    PT Duration 6 weeks    PT Treatment/Interventions ADLs/Self Care Home Management;Aquatic Therapy;Biofeedback;Electrical Stimulation;Iontophoresis 4mg /ml Dexamethasone;Moist Heat;Traction;Ultrasound;DME Instruction;Gait training;Stair training;Functional mobility training;Therapeutic activities;Therapeutic exercise;Balance training;Orthotic Fit/Training;Patient/family education;Neuromuscular re-education;Manual techniques;Dry needling;Energy conservation;Splinting;Spinal  Manipulations;Joint Manipulations    PT Next Visit Plan begin core and hip strengthening, hip adductor stretches, hip mobility exercises, possibly manual for pain/mobility    PT Home Exercise Plan hip add isometrics, prone lying with one pillow under pelvis; 09/27/20 - supine bridges; prone on elbows 11/8 piriformis str           Patient will benefit from skilled therapeutic intervention in order to improve the following deficits and impairments:  Abnormal gait, Decreased activity tolerance, Decreased balance, Decreased mobility, Decreased strength, Difficulty walking, Pain, Impaired flexibility, Improper body  mechanics, Postural dysfunction  Visit Diagnosis: Low back pain, unspecified back pain laterality, unspecified chronicity, unspecified whether sciatica present  Pain in left hip  Muscle weakness (generalized)  Other abnormalities of gait and mobility  Other symptoms and signs involving the musculoskeletal system     Problem List Patient Active Problem List   Diagnosis Date Noted  . Malignant neoplasm of lower-inner quadrant of right breast of female, estrogen receptor positive ([place]) 06/12/2019  . Spondylolisthesis of lumbar region 02/03/2018  . Lumbar spondylosis 02/03/2018  . PSVT (paroxysmal supraventricular tachycardia) ([place]) 01/05/2012  . Near syncope 12/05/2011  . Palpitations 12/05/2011  . Abnormal ECG 12/05/2011  . HYPERLIPIDEMIA 11/10/2006  . Essential hypertension, benign 11/10/2006    10:41 AM, 10/04/20 Mearl Latin PT, DPT Physical Therapist at [place], [place], [address] Phone: [phone]   Fax:  [phone]  Name: Miranda [name] MRN: [id number] Date of Birth: [birth year]

## 2020-10-04 NOTE — Patient Instructions (Signed)
Access Code: [email or web address] URL: [email or web address] Date: 10/04/2020 Prepared by: Mitzi Hansen Staphanie Harbison  Exercises Supine Piriformis Stretch with Foot on Ground - 1 x daily - 7 x weekly - 3 reps - 30 second hold

## 2020-10-06 ENCOUNTER — Encounter (HOSPITAL_COMMUNITY): Payer: Self-pay | Admitting: Physical Therapy

## 2020-10-06 ENCOUNTER — Other Ambulatory Visit: Payer: Self-pay

## 2020-10-06 ENCOUNTER — Ambulatory Visit (HOSPITAL_COMMUNITY): Payer: Medicare Other | Admitting: Physical Therapy

## 2020-10-06 DIAGNOSIS — R29898 Other symptoms and signs involving the musculoskeletal system: Secondary | ICD-10-CM

## 2020-10-06 DIAGNOSIS — M545 Low back pain, unspecified: Secondary | ICD-10-CM | POA: Diagnosis not present

## 2020-10-06 DIAGNOSIS — M6281 Muscle weakness (generalized): Secondary | ICD-10-CM

## 2020-10-06 DIAGNOSIS — M25552 Pain in left hip: Secondary | ICD-10-CM | POA: Diagnosis not present

## 2020-10-06 DIAGNOSIS — R2689 Other abnormalities of gait and mobility: Secondary | ICD-10-CM

## 2020-10-06 NOTE — Therapy (Signed)
[place], [place], [address] Phone: [phone]   Fax:  [phone]  Physical Therapy Treatment  Patient Details  Name: Miranda [name] MRN: [id number] Date of Birth: December 29, 1933 Referring Provider (PT): Arther Abbott MD   Encounter Date: 10/06/2020   PT End of Session - 10/06/20 0823    Visit Number 7    Number of Visits 12    Date for PT Re-Evaluation 10/20/20    Authorization Type Primary Medicare Secondary BCBS No auth, vl 50 PT/OT/ST 0 used    Authorization - Visit Number 7    Authorization - Number of Visits 50    Progress Note Due on Visit 10    PT Start Time 0830    PT Stop Time 0910    PT Time Calculation (min) 40 min    Activity Tolerance Patient tolerated treatment well    Behavior During Therapy [facility] for tasks assessed/performed           Past Medical History:  Diagnosis Date  . Asthma   . Cancer ([place])   . Essential hypertension, benign   . Mixed hyperlipidemia   . Nephrolithiasis     Past Surgical History:  Procedure Laterality Date  . ABDOMINAL HYSTERECTOMY     excessive bleeding, no cancer   . BREAST LUMPECTOMY Right 2020  . Left knee arthroscopy due to torn ligament and cartiallege  2006   [facility]   . Left lung - lobectomy lower lobe - lung cancer    . RADIOACTIVE SEED GUIDED PARTIAL MASTECTOMY WITH AXILLARY SENTINEL LYMPH NODE BIOPSY Right 06/24/2019   Procedure: RADIOACTIVE SEED GUIDED RIGHT BREAST PARTIAL MASTECTOMY WITH  SENTINEL LYMPH NODE BIOPSY;  Surgeon: Coralie Keens, MD;  Location: [facility];  Service: General;  Laterality: Right;    There were no vitals filed for this visit.   Subjective Assessment - 10/06/20 0823    Subjective Patient states she has been doing good. She continues to have slight tingling in leg and pain is a little worse in low back/butt. She has been doing her exercises. She has pain in R leg when she first gets up from sitting.    Patient Stated  Goals decrease pain    Currently in Pain? Yes    Pain Score 4     Pain Location Leg    Pain Orientation Left;Lateral    Pain Descriptors / Indicators Burning;Tingling    Pain Onset More than a month ago                             [facility] Adult PT Treatment/Exercise - 10/06/20 0001      Lumbar Exercises: Stretches   Sports administrator 30 seconds;4 reps;Right;Left    Quad Stretch Limitations prone  with strap      Lumbar Exercises: Standing   Other Standing Lumbar Exercises Palof 1x 10 bilateral green band      Lumbar Exercises: Supine   Bridge 10 reps;3 seconds      Manual Therapy   Manual Therapy Soft tissue mobilization    Manual therapy comments completed separate rest of treatment    Soft tissue mobilization L glue at lateral sacral border inferior to PSIS; self stm with ball                   PT Education - 10/06/20 [phone]    Education Details Patient educated on HEP, exercise mechanics  Person(s) Educated Patient    Methods Explanation;Demonstration    Comprehension Verbalized understanding;Returned demonstration            PT Short Term Goals - 09/27/20 1107      PT SHORT TERM GOAL #1   Title Patient will be independent with HEP in order to improve functional outcomes.    Time 3    Period Weeks    Status On-going    Target Date 09/29/20      PT SHORT TERM GOAL #2   Title Patient will report at least 25% improvement in symptoms for improved quality of life.    Time 3    Period Weeks    Status On-going    Target Date 09/29/20             PT Long Term Goals - 09/27/20 1107      PT LONG TERM GOAL #1   Title Patient will report at least 75% improvement in symptoms for improved quality of life.    Time 6    Period Weeks    Status On-going      PT LONG TERM GOAL #2   Title Patient will improve FOTO score by at least 5 points in order to indicate improved tolerance to activity.    Time 6    Period Weeks    Status On-going       PT LONG TERM GOAL #3   Title Patient will demonstrate at least 25% improvement in lumbar ROM in all restricted planes for improved ability to move trunk while at home.    Time 6    Period Weeks    Status On-going                 Plan - 10/06/20 0272    Clinical Impression Statement Patient with antalgic gait on RLE when first transferring from sitting and she states she has been having pain in R quad/hip flexor when getting up which eases with movement. Started session with prone quad stretch which patient states targets painful area. Patient tolerates STM to L lateral border of sacrum at glutes just inferior to L PSIS well, and notes improvement in symptoms following. Patient shown how to complete self STM with ball and is able to perform correctly. Patient continues to have tingling/burning in L quad and completes prone quad stretch on LLE with decrease in symptoms following.  She states pain in low back/gluteal region on L resolved, tingling/burning in L proximal LE decreased to 2/10, and R quad symptoms resolved at end of session. Patient will continue to benefit from skilled physical therapy in order to reduce impairment and improve function.    Personal Factors and Comorbidities Age;Comorbidity 3+;Fitness;Past/Current Experience    Comorbidities hx Back pain, BMI over 30, Cancer, High Blood Pressure    Examination-Activity Limitations Bed Mobility;Lift;Stand;Stairs;Squat;Sit;Locomotion Level;Transfers    Examination-Participation Restrictions Church;Cleaning;Community Activity;Yard Work;Volunteer;Shop    Stability/Clinical Decision Making Stable/Uncomplicated    Rehab Potential Good    PT Frequency 2x / week    PT Duration 6 weeks    PT Treatment/Interventions ADLs/Self Care Home Management;Aquatic Therapy;Biofeedback;Electrical Stimulation;Iontophoresis 4mg /ml Dexamethasone;Moist Heat;Traction;Ultrasound;DME Instruction;Gait training;Stair training;Functional mobility  training;Therapeutic activities;Therapeutic exercise;Balance training;Orthotic Fit/Training;Patient/family education;Neuromuscular re-education;Manual techniques;Dry needling;Energy conservation;Splinting;Spinal Manipulations;Joint Manipulations    PT Next Visit Plan begin core and hip strengthening, hip adductor stretches, hip mobility exercises, possibly manual for pain/mobility    PT Home Exercise Plan hip add isometrics, prone lying with one pillow under pelvis; 09/27/20 - supine bridges; prone  on elbows 11/8 piriformis str 11/10 prone quad stretch, self STM with ball           Patient will benefit from skilled therapeutic intervention in order to improve the following deficits and impairments:  Abnormal gait, Decreased activity tolerance, Decreased balance, Decreased mobility, Decreased strength, Difficulty walking, Pain, Impaired flexibility, Improper body mechanics, Postural dysfunction  Visit Diagnosis: Low back pain, unspecified back pain laterality, unspecified chronicity, unspecified whether sciatica present  Pain in left hip  Muscle weakness (generalized)  Other abnormalities of gait and mobility  Other symptoms and signs involving the musculoskeletal system     Problem List Patient Active Problem List   Diagnosis Date Noted  . Malignant neoplasm of lower-inner quadrant of right breast of female, estrogen receptor positive ([place]) 06/12/2019  . Spondylolisthesis of lumbar region 02/03/2018  . Lumbar spondylosis 02/03/2018  . PSVT (paroxysmal supraventricular tachycardia) ([place]) 01/05/2012  . Near syncope 12/05/2011  . Palpitations 12/05/2011  . Abnormal ECG 12/05/2011  . HYPERLIPIDEMIA 11/10/2006  . Essential hypertension, benign 11/10/2006   9:16 AM, 10/06/20 Mearl Latin PT, DPT Physical Therapist at [place], [place], [address] Phone: [phone]   Fax:   [phone]  Name: Miranda [name] MRN: [id number] Date of Birth: July 28, 1934

## 2020-10-06 NOTE — Patient Instructions (Signed)
Access Code: [email or web address] URL: [email or web address] Date: 10/06/2020 Prepared by: Mitzi Hansen Brealyn Baril  Exercises Prone Quadriceps Stretch with Strap - 1 x daily - 7 x weekly - 5 reps - 20-30 second hold Standing massage with ball at wall on low back - 1 x daily - 7 x weekly - 5 minutes hold

## 2020-10-11 ENCOUNTER — Ambulatory Visit (HOSPITAL_COMMUNITY): Payer: Medicare Other

## 2020-10-11 DIAGNOSIS — H04123 Dry eye syndrome of bilateral lacrimal glands: Secondary | ICD-10-CM | POA: Diagnosis not present

## 2020-10-11 DIAGNOSIS — Z853 Personal history of malignant neoplasm of breast: Secondary | ICD-10-CM | POA: Diagnosis not present

## 2020-10-11 DIAGNOSIS — I129 Hypertensive chronic kidney disease with stage 1 through stage 4 chronic kidney disease, or unspecified chronic kidney disease: Secondary | ICD-10-CM | POA: Diagnosis not present

## 2020-10-11 DIAGNOSIS — E876 Hypokalemia: Secondary | ICD-10-CM | POA: Diagnosis not present

## 2020-10-11 DIAGNOSIS — M79602 Pain in left arm: Secondary | ICD-10-CM | POA: Diagnosis not present

## 2020-10-11 DIAGNOSIS — I10 Essential (primary) hypertension: Secondary | ICD-10-CM | POA: Diagnosis not present

## 2020-10-11 DIAGNOSIS — N182 Chronic kidney disease, stage 2 (mild): Secondary | ICD-10-CM | POA: Diagnosis not present

## 2020-10-11 DIAGNOSIS — E782 Mixed hyperlipidemia: Secondary | ICD-10-CM | POA: Diagnosis not present

## 2020-10-11 DIAGNOSIS — R002 Palpitations: Secondary | ICD-10-CM | POA: Diagnosis not present

## 2020-10-11 DIAGNOSIS — R7301 Impaired fasting glucose: Secondary | ICD-10-CM | POA: Diagnosis not present

## 2020-10-11 DIAGNOSIS — N1831 Chronic kidney disease, stage 3a: Secondary | ICD-10-CM | POA: Diagnosis not present

## 2020-10-11 DIAGNOSIS — H402231 Chronic angle-closure glaucoma, bilateral, mild stage: Secondary | ICD-10-CM | POA: Diagnosis not present

## 2020-10-11 DIAGNOSIS — H02403 Unspecified ptosis of bilateral eyelids: Secondary | ICD-10-CM | POA: Diagnosis not present

## 2020-10-11 DIAGNOSIS — M79604 Pain in right leg: Secondary | ICD-10-CM | POA: Diagnosis not present

## 2020-10-11 DIAGNOSIS — D0511 Intraductal carcinoma in situ of right breast: Secondary | ICD-10-CM | POA: Diagnosis not present

## 2020-10-13 ENCOUNTER — Ambulatory Visit (HOSPITAL_COMMUNITY): Payer: Medicare Other

## 2020-10-13 ENCOUNTER — Other Ambulatory Visit: Payer: Self-pay

## 2020-10-13 ENCOUNTER — Encounter (HOSPITAL_COMMUNITY): Payer: Self-pay

## 2020-10-13 DIAGNOSIS — R2689 Other abnormalities of gait and mobility: Secondary | ICD-10-CM

## 2020-10-13 DIAGNOSIS — M545 Low back pain, unspecified: Secondary | ICD-10-CM | POA: Diagnosis not present

## 2020-10-13 DIAGNOSIS — M6281 Muscle weakness (generalized): Secondary | ICD-10-CM

## 2020-10-13 DIAGNOSIS — M25552 Pain in left hip: Secondary | ICD-10-CM | POA: Diagnosis not present

## 2020-10-13 DIAGNOSIS — R29898 Other symptoms and signs involving the musculoskeletal system: Secondary | ICD-10-CM

## 2020-10-13 NOTE — Therapy (Signed)
[place], [place], [address] Phone: [phone]   Fax:  [phone]  Physical Therapy Treatment  Patient Details  Name: Miranda [name] MRN: [id number] Date of Birth: 06/15/1934 Referring Provider (PT): Arther Abbott MD   Encounter Date: 10/13/2020   PT End of Session - 10/13/20 0834    Visit Number 8    Number of Visits 12    Date for PT Re-Evaluation 10/20/20    Authorization Type Primary Medicare Secondary BCBS No auth, vl 50 PT/OT/ST 0 used    Authorization - Visit Number 8    Authorization - Number of Visits 50    Progress Note Due on Visit 10    PT Start Time 0830    PT Stop Time 0910    PT Time Calculation (min) 40 min    Activity Tolerance Patient tolerated treatment well    Behavior During Therapy [facility] for tasks assessed/performed           Past Medical History:  Diagnosis Date  . Asthma   . Cancer ([place])   . Essential hypertension, benign   . Mixed hyperlipidemia   . Nephrolithiasis     Past Surgical History:  Procedure Laterality Date  . ABDOMINAL HYSTERECTOMY     excessive bleeding, no cancer   . BREAST LUMPECTOMY Right 2020  . Left knee arthroscopy due to torn ligament and cartiallege  2006   [place]   . Left lung - lobectomy lower lobe - lung cancer    . RADIOACTIVE SEED GUIDED PARTIAL MASTECTOMY WITH AXILLARY SENTINEL LYMPH NODE BIOPSY Right 06/24/2019   Procedure: RADIOACTIVE SEED GUIDED RIGHT BREAST PARTIAL MASTECTOMY WITH  SENTINEL LYMPH NODE BIOPSY;  Surgeon: Coralie Keens, MD;  Location: [facility];  Service: General;  Laterality: Right;    There were no vitals filed for this visit.   Subjective Assessment - 10/13/20 0832    Subjective Pt stated she is feeling better.  Reports pain in Lt side and tingling almost resolved.  Main problem this morning anterior Rt thigh and groin following sitting that improves as she walks.    Patient Stated Goals decrease pain    Currently  in Pain? Yes    Pain Score 4     Pain Location Leg    Pain Orientation Right;Anterior    Pain Descriptors / Indicators Tightness    Pain Type Chronic pain    Pain Onset More than a month ago    Pain Frequency Constant    Aggravating Factors  unsure    Pain Relieving Factors MHP, stretch    Effect of Pain on Daily Activities somewhat, limited ability to stand                             [facility] Adult PT Treatment/Exercise - 10/13/20 0001      Posture/Postural Control   Posture/Postural Control Postural limitations    Postural Limitations Rounded Shoulders;Forward head      Lumbar Exercises: Stretches   Hip Flexor Stretch 2 reps;30 seconds    Hip Flexor Stretch Limitations Thomas Immunologist 30 seconds;3 reps    Sports administrator Limitations prone  with strap      Lumbar Exercises: Standing   Row 10 reps;Theraband    Theraband Level (Row) Level 3 (Green)    Shoulder Extension 10 reps;Theraband    Theraband Level (Shoulder Extension) Level 3 (Green)  Other Standing Lumbar Exercises Palof 1x 15 bilateral green band NBOS      Lumbar Exercises: Supine   Bridge 10 reps;3 seconds      Manual Therapy   Manual Therapy Soft tissue mobilization    Manual therapy comments completed separate rest of treatment    Soft tissue mobilization L glue at lateral sacral border inferior to PSIS; self stm with ball                   PT Education - 10/13/20 0848    Education Details Reviewed importance of posture for pain control    Person(s) Educated Patient    Methods Explanation;Demonstration    Comprehension Verbalized understanding;Returned demonstration            PT Short Term Goals - 09/27/20 1107      PT SHORT TERM GOAL #1   Title Patient will be independent with HEP in order to improve functional outcomes.    Time 3    Period Weeks    Status On-going    Target Date 09/29/20      PT SHORT TERM GOAL #2   Title Patient will report at least  25% improvement in symptoms for improved quality of life.    Time 3    Period Weeks    Status On-going    Target Date 09/29/20             PT Long Term Goals - 09/27/20 1107      PT LONG TERM GOAL #1   Title Patient will report at least 75% improvement in symptoms for improved quality of life.    Time 6    Period Weeks    Status On-going      PT LONG TERM GOAL #2   Title Patient will improve FOTO score by at least 5 points in order to indicate improved tolerance to activity.    Time 6    Period Weeks    Status On-going      PT LONG TERM GOAL #3   Title Patient will demonstrate at least 25% improvement in lumbar ROM in all restricted planes for improved ability to move trunk while at home.    Time 6    Period Weeks    Status On-going                 Plan - 10/13/20 0848    Clinical Impression Statement Pt presents with antalgic gait mechanics upon initial standing with reports of pain in Rt groin/quad which eases with movement.  Pt standing with forward head/rolled shoulders, educated importance of posture for pain control.   Added postural strenghtening to POC.  Conitnued with quad stretches prone and added thomas stretch with reports of relief following stretches.  Manual STM complete to L lateral border of sacrum at glutes just inferior to L PSIS well, and notes improvement in symptoms following. Improved gait mechanics with increased distance.  Reports of tingling resolved at EOS.    Personal Factors and Comorbidities Age;Comorbidity 3+;Fitness;Past/Current Experience    Comorbidities hx Back pain, BMI over 30, Cancer, High Blood Pressure    Examination-Activity Limitations Bed Mobility;Lift;Stand;Stairs;Squat;Sit;Locomotion Level;Transfers    Examination-Participation Restrictions Church;Cleaning;Community Activity;Yard Work;Volunteer;Shop    Stability/Clinical Decision Making Stable/Uncomplicated    Clinical Decision Making Low    Rehab Potential Good    PT  Frequency 2x / week    PT Duration 6 weeks    PT Treatment/Interventions ADLs/Self Care Home Management;Aquatic Therapy;Biofeedback;Electrical Stimulation;Iontophoresis  4mg /ml Dexamethasone;Moist Heat;Traction;Ultrasound;DME Instruction;Gait training;Stair training;Functional mobility training;Therapeutic activities;Therapeutic exercise;Balance training;Orthotic Fit/Training;Patient/family education;Neuromuscular re-education;Manual techniques;Dry needling;Energy conservation;Splinting;Spinal Manipulations;Joint Manipulations    PT Next Visit Plan begin core and hip strengthening, hip adductor stretches, hip mobility exercises, possibly manual for pain/mobility    PT Home Exercise Plan hip add isometrics, prone lying with one pillow under pelvis; 09/27/20 - supine bridges; prone on elbows 11/8 piriformis str 11/10 prone quad stretch, self STM with ball           Patient will benefit from skilled therapeutic intervention in order to improve the following deficits and impairments:  Abnormal gait, Decreased activity tolerance, Decreased balance, Decreased mobility, Decreased strength, Difficulty walking, Pain, Impaired flexibility, Improper body mechanics, Postural dysfunction  Visit Diagnosis: Low back pain, unspecified back pain laterality, unspecified chronicity, unspecified whether sciatica present  Pain in left hip  Muscle weakness (generalized)  Other abnormalities of gait and mobility  Other symptoms and signs involving the musculoskeletal system     Problem List Patient Active Problem List   Diagnosis Date Noted  . Malignant neoplasm of lower-inner quadrant of right breast of female, estrogen receptor positive ([place]) 06/12/2019  . Spondylolisthesis of lumbar region 02/03/2018  . Lumbar spondylosis 02/03/2018  . PSVT (paroxysmal supraventricular tachycardia) ([place]) 01/05/2012  . Near syncope 12/05/2011  . Palpitations 12/05/2011  . Abnormal ECG 12/05/2011  . HYPERLIPIDEMIA  11/10/2006  . Essential hypertension, benign 11/10/2006   Ihor Austin, LPTA/CLT; CBIS [phone]  Aldona Lento 10/13/2020, 2:10 PM  [place], [place], [address] Phone: [phone]   Fax:  [phone]  Name: Miranda [name] MRN: [id number] Date of Birth: 10-03-1934

## 2020-10-14 ENCOUNTER — Encounter (HOSPITAL_COMMUNITY): Payer: Self-pay

## 2020-10-14 ENCOUNTER — Ambulatory Visit (HOSPITAL_COMMUNITY): Payer: Medicare Other

## 2020-10-14 DIAGNOSIS — M545 Low back pain, unspecified: Secondary | ICD-10-CM

## 2020-10-14 DIAGNOSIS — M6281 Muscle weakness (generalized): Secondary | ICD-10-CM

## 2020-10-14 DIAGNOSIS — R29898 Other symptoms and signs involving the musculoskeletal system: Secondary | ICD-10-CM

## 2020-10-14 DIAGNOSIS — M25552 Pain in left hip: Secondary | ICD-10-CM

## 2020-10-14 DIAGNOSIS — R2689 Other abnormalities of gait and mobility: Secondary | ICD-10-CM | POA: Diagnosis not present

## 2020-10-14 NOTE — Therapy (Signed)
[place], [place], [address] Phone: [phone]   Fax:  [phone]  Physical Therapy Treatment  Patient Details  Name: Miranda [name] MRN: [id number] Date of Birth: 1934-07-02 Referring Provider (PT): Arther Abbott MD   Encounter Date: 10/14/2020   PT End of Session - 10/14/20 0849    Visit Number 9    Number of Visits 12    Date for PT Re-Evaluation 10/20/20    Authorization Type Primary Medicare Secondary BCBS No auth, vl 50 PT/OT/ST 0 used    Authorization - Visit Number 9    Authorization - Number of Visits 50    Progress Note Due on Visit 10    PT Start Time 0850    PT Stop Time 0933    PT Time Calculation (min) 43 min    Activity Tolerance Patient tolerated treatment well    Behavior During Therapy [facility] for tasks assessed/performed           Past Medical History:  Diagnosis Date  . Asthma   . Cancer ([place])   . Essential hypertension, benign   . Mixed hyperlipidemia   . Nephrolithiasis     Past Surgical History:  Procedure Laterality Date  . ABDOMINAL HYSTERECTOMY     excessive bleeding, no cancer   . BREAST LUMPECTOMY Right 2020  . Left knee arthroscopy due to torn ligament and cartiallege  2006   [place]   . Left lung - lobectomy lower lobe - lung cancer    . RADIOACTIVE SEED GUIDED PARTIAL MASTECTOMY WITH AXILLARY SENTINEL LYMPH NODE BIOPSY Right 06/24/2019   Procedure: RADIOACTIVE SEED GUIDED RIGHT BREAST PARTIAL MASTECTOMY WITH  SENTINEL LYMPH NODE BIOPSY;  Surgeon: Coralie Keens, MD;  Location: [facility];  Service: General;  Laterality: Right;    There were no vitals filed for this visit.   Subjective Assessment - 10/14/20 0849    Subjective Pt stated she is feeling better.  Reports pain in Lt side and tingling almost resolved.  Continues with main problem this morning anterior Rt thigh and groin following sitting that improves as she walks.    Patient Stated Goals decrease pain     Currently in Pain? Yes    Pain Score 4     Pain Location Hip    Pain Orientation Anterior;Right;Proximal;Medial    Pain Descriptors / Indicators Sharp;Shooting    Pain Onset More than a month ago           [facility] Adult PT Treatment/Exercise - 10/14/20 0001      Posture/Postural Control   Posture/Postural Control Postural limitations    Postural Limitations Rounded Shoulders;Forward head      Lumbar Exercises: Stretches   Hip Flexor Stretch 3 reps;30 seconds    Hip Flexor Stretch Limitations Thomas Immunologist 30 seconds;3 reps    Sports administrator Limitations prone  with strap and pillow under right knee    Figure 4 Stretch 3 reps;10 seconds    Figure 4 Stretch Limitations prone with self contract/relax pushing anterior right pelvis into mat      Lumbar Exercises: Standing   Row 10 reps;Theraband    Theraband Level (Row) Level 3 (Green)    Shoulder Extension 10 reps;Theraband    Theraband Level (Shoulder Extension) Level 3 (Green)    Other Standing Lumbar Exercises Palof 1x 15 bilateral green band NBOS      Lumbar Exercises: Supine   Bridge 10 reps;5 seconds  Manual Therapy   Manual Therapy Joint mobilization    Manual therapy comments completed separate rest of treatment    Joint Mobilization prone figure 4 hip jt mob grade III 30 sec x5    Soft tissue mobilization supine contract/relax 3 sec hold x5 in figure 4; prone quad/hip flexor stretch position w/ pelvis stabilized and oscillations 10 sec x3 into hip extension              PT Education - 10/14/20 0902    Education Details Discussed purpose and technique of interventions throughout session. Advanced HEP.    Person(s) Educated Patient    Methods Explanation;Handout    Comprehension Verbalized understanding            PT Short Term Goals - 10/14/20 0850      PT SHORT TERM GOAL #1   Title Patient will be independent with HEP in order to improve functional outcomes.    Time 3    Period  Weeks    Status Achieved    Target Date 09/29/20      PT SHORT TERM GOAL #2   Title Patient will report at least 25% improvement in symptoms for improved quality of life.    Baseline 10/14/20 - limited by right leg pain now but 80% better, able to stand and do more.    Time 3    Period Weeks    Status On-going    Target Date 09/29/20             PT Long Term Goals - 10/14/20 0850      PT LONG TERM GOAL #1   Title Patient will report at least 75% improvement in symptoms for improved quality of life.    Baseline 10/14/20 - limited by right leg pain now but 80% better, able to stand and do more.    Time 6    Period Weeks    Status Partially Met      PT LONG TERM GOAL #2   Title Patient will improve FOTO score by at least 5 points in order to indicate improved tolerance to activity.    Time 6    Period Weeks    Status On-going      PT LONG TERM GOAL #3   Title Patient will demonstrate at least 25% improvement in lumbar ROM in all restricted planes for improved ability to move trunk while at home.    Time 6    Period Weeks    Status On-going             Plan - 10/14/20 0850    Clinical Impression Statement Pt presents with antalgic gait mechanics upon initial standing with reports of pain in Rt groin/quad which patient reports eases with movement, however, antalgic gait pattern continued >100 feet in clinic. Continued with quad stretch adding emphasis on hip flexor by adding a pillow under right knee. Continue with Thomas stretch in supine. Added prone figure 4 contract relax activity to open anterior hip capsule and stretch right hip adductors; added a strap to secure right ankle during activity. Performed manual therapy through PA joint mobilizations to right hip while patient in prone figure 4 position using Grade II/III oscillations. Added manual therapy through prone PA stabilization of right pelvis while patient in quad/hip flexor stretch position and upward oscillations  of distal femur manually. Added supine figure 4 position with manual contract/relax on right knee into adduction, with overpressure into abduction/external rotation during relax phase. Continued with  core stabilization rows, shoulder extension and Pallof press exercises. Advanced HEP to include prone figure 4 contract/relax activity and added pillow to prone quad stretch at home to emphasis hip flexor component. Patient reported no pain at end of session, exhibited more erect posturing and no antalgic gait. Patient advised to perform hip excursion and weight shifting upon standing before attempting to walk to provide the hip joints with lubrication prior to ambulation. Patient would continue to benefit from skilled physical therapy to decrease impairment, improve QOL and return to prior level of function with reduced pain.    Personal Factors and Comorbidities Age;Comorbidity 3+;Fitness;Past/Current Experience    Comorbidities hx Back pain, BMI over 30, Cancer, High Blood Pressure    Examination-Activity Limitations Bed Mobility;Lift;Stand;Stairs;Squat;Sit;Locomotion Level;Transfers    Examination-Participation Restrictions Church;Cleaning;Community Activity;Yard Work;Volunteer;Shop    Stability/Clinical Decision Making Stable/Uncomplicated    Rehab Potential Good    PT Frequency 2x / week    PT Duration 6 weeks    PT Treatment/Interventions ADLs/Self Care Home Management;Aquatic Therapy;Biofeedback;Electrical Stimulation;Iontophoresis 57m/ml Dexamethasone;Moist Heat;Traction;Ultrasound;DME Instruction;Gait training;Stair training;Functional mobility training;Therapeutic activities;Therapeutic exercise;Balance training;Orthotic Fit/Training;Patient/family education;Neuromuscular re-education;Manual techniques;Dry needling;Energy conservation;Splinting;Spinal Manipulations;Joint Manipulations    PT Next Visit Plan RE-EVAL 10 visit; hip adductor stretches, hip mobility exercises, possibly manual for  pain/mobility    PT Home Exercise Plan hip add isometrics, prone lying with one pillow under pelvis; 09/27/20 - supine bridges; prone on elbows 11/8 piriformis str 11/10 prone quad stretch, self STM with ball; 10/14/20 - prone figure 4 contract/relax for hip adductors, add pillow to prone quad stret for hip flexors           Patient will benefit from skilled therapeutic intervention in order to improve the following deficits and impairments:  Abnormal gait, Decreased activity tolerance, Decreased balance, Decreased mobility, Decreased strength, Difficulty walking, Pain, Impaired flexibility, Improper body mechanics, Postural dysfunction  Visit Diagnosis: Low back pain, unspecified back pain laterality, unspecified chronicity, unspecified whether sciatica present  Pain in left hip  Muscle weakness (generalized)  Other abnormalities of gait and mobility  Other symptoms and signs involving the musculoskeletal system     Problem List Patient Active Problem List   Diagnosis Date Noted  . Malignant neoplasm of lower-inner quadrant of right breast of female, estrogen receptor positive ([place] 06/12/2019  . Spondylolisthesis of lumbar region 02/03/2018  . Lumbar spondylosis 02/03/2018  . PSVT (paroxysmal supraventricular tachycardia) ([place] 01/05/2012  . Near syncope 12/05/2011  . Palpitations 12/05/2011  . Abnormal ECG 12/05/2011  . HYPERLIPIDEMIA 11/10/2006  . Essential hypertension, benign 11/10/2006    BFloria Raveling Hartnett-Rands, MS, PT Per [place]##[id number]/18/2021, 11:36 AM  [place] [address]Phone: [phone]  Fax:  [phone] Name: Miranda HEGWOODMRN: [id number] of Birth: 07/30/1934-05-29

## 2020-10-14 NOTE — Patient Instructions (Addendum)
External Hip Rotation (Prone)    No pillow under belly, bend left knee and slowly lower leg in until hip starts to rise. Keep stomach tight. Push right knee into bed/mat. Hold 5 seconds. Repeat _5___ times per set. Do __1__ sets per session. Do __1__ sessions per day.  http://orth.exer.us/1085   Copyright  VHI. All rights reserved.   CAREGIVER ASSISTED: Hip Flexors - Prone    No caregiver - pillow under right knee;  Do quad stretch with strap. Hold _30__ seconds. 3___ reps per set, _1__ sets per day.   Copyright  VHI. All rights reserved.

## 2020-10-15 DIAGNOSIS — I129 Hypertensive chronic kidney disease with stage 1 through stage 4 chronic kidney disease, or unspecified chronic kidney disease: Secondary | ICD-10-CM | POA: Diagnosis not present

## 2020-10-15 DIAGNOSIS — M5442 Lumbago with sciatica, left side: Secondary | ICD-10-CM | POA: Diagnosis not present

## 2020-10-15 DIAGNOSIS — R7303 Prediabetes: Secondary | ICD-10-CM | POA: Diagnosis not present

## 2020-10-15 DIAGNOSIS — Z853 Personal history of malignant neoplasm of breast: Secondary | ICD-10-CM | POA: Diagnosis not present

## 2020-10-15 DIAGNOSIS — R002 Palpitations: Secondary | ICD-10-CM | POA: Diagnosis not present

## 2020-10-15 DIAGNOSIS — R7301 Impaired fasting glucose: Secondary | ICD-10-CM | POA: Diagnosis not present

## 2020-10-15 DIAGNOSIS — Z85118 Personal history of other malignant neoplasm of bronchus and lung: Secondary | ICD-10-CM | POA: Diagnosis not present

## 2020-10-15 DIAGNOSIS — N182 Chronic kidney disease, stage 2 (mild): Secondary | ICD-10-CM | POA: Diagnosis not present

## 2020-10-15 DIAGNOSIS — E876 Hypokalemia: Secondary | ICD-10-CM | POA: Diagnosis not present

## 2020-10-15 DIAGNOSIS — M79602 Pain in left arm: Secondary | ICD-10-CM | POA: Diagnosis not present

## 2020-10-15 DIAGNOSIS — E782 Mixed hyperlipidemia: Secondary | ICD-10-CM | POA: Diagnosis not present

## 2020-10-18 ENCOUNTER — Encounter (HOSPITAL_COMMUNITY): Payer: Self-pay | Admitting: Physical Therapy

## 2020-10-18 ENCOUNTER — Ambulatory Visit (HOSPITAL_COMMUNITY): Payer: Medicare Other | Admitting: Physical Therapy

## 2020-10-18 ENCOUNTER — Other Ambulatory Visit: Payer: Self-pay

## 2020-10-18 DIAGNOSIS — R2689 Other abnormalities of gait and mobility: Secondary | ICD-10-CM | POA: Diagnosis not present

## 2020-10-18 DIAGNOSIS — R29898 Other symptoms and signs involving the musculoskeletal system: Secondary | ICD-10-CM

## 2020-10-18 DIAGNOSIS — M6281 Muscle weakness (generalized): Secondary | ICD-10-CM | POA: Diagnosis not present

## 2020-10-18 DIAGNOSIS — M545 Low back pain, unspecified: Secondary | ICD-10-CM

## 2020-10-18 DIAGNOSIS — M25552 Pain in left hip: Secondary | ICD-10-CM | POA: Diagnosis not present

## 2020-10-18 NOTE — Patient Instructions (Signed)
Access Code: [email or web address] URL: [email or web address] Date: 10/18/2020 Prepared by: [facility] Henretta Quist  Exercises Standing Shoulder Row with Anchored Resistance - 1 x daily - 7 x weekly - 2 sets - 10 reps Shoulder extension with resistance - Neutral - 1 x daily - 7 x weekly - 2 sets - 10 reps Standing Anti-Rotation Press with Anchored Resistance - 1 x daily - 7 x weekly - 2 sets - 10 reps

## 2020-10-18 NOTE — Therapy (Signed)
[place], [place], [address] Phone: [phone]   Fax:  [phone]  Physical Therapy Treatment/ Discharge Summary  Patient Details  Name: Miranda [name] MRN: [id number] Date of Birth: 06/26/1934 Referring Provider (PT): Arther Abbott MD   Encounter Date: 10/18/2020  PHYSICAL THERAPY DISCHARGE SUMMARY  Visits from Start of Care: 10   Current functional level related to goals / functional outcomes: See below   Remaining deficits: See below   Education / Equipment: See below  Plan: Patient agrees to discharge.  Patient goals were met. Patient is being discharged due to meeting the stated rehab goals.  ?????       PT End of Session - 10/18/20 0839    Visit Number 10    Number of Visits 12    Date for PT Re-Evaluation 10/20/20    Authorization Type Primary Medicare Secondary BCBS No auth, vl 50 PT/OT/ST 0 used    Authorization - Visit Number 10    Authorization - Number of Visits 50    Progress Note Due on Visit 10    PT Start Time 0837    PT Stop Time 0908    PT Time Calculation (min) 31 min    Activity Tolerance Patient tolerated treatment well    Behavior During Therapy WFL for tasks assessed/performed           Past Medical History:  Diagnosis Date  . Asthma   . Cancer ([place])   . Essential hypertension, benign   . Mixed hyperlipidemia   . Nephrolithiasis     Past Surgical History:  Procedure Laterality Date  . ABDOMINAL HYSTERECTOMY     excessive bleeding, no cancer   . BREAST LUMPECTOMY Right 2020  . Left knee arthroscopy due to torn ligament and cartiallege  2006   [facility]   . Left lung - lobectomy lower lobe - lung cancer    . RADIOACTIVE SEED GUIDED PARTIAL MASTECTOMY WITH AXILLARY SENTINEL LYMPH NODE BIOPSY Right 06/24/2019   Procedure: RADIOACTIVE SEED GUIDED RIGHT BREAST PARTIAL MASTECTOMY WITH  SENTINEL LYMPH NODE BIOPSY;  Surgeon: Coralie Keens, MD;  Location: [facility];  Service: General;  Laterality: Right;    There were no vitals filed for this visit.   Subjective Assessment - 10/18/20 0840    Subjective Patient states she is feeling a lot better. Her home exercises are going alright. Patient states about 97% improvement with physical therapy intervention. She remains limited by anterior hip pain. Sometimes the stretches are aggravating to her hip. She continues to have slight burning/tingling in L thigh. She has sharp pain in the front of her leg when she first gets up on the R leg but after she starts moving it gets better. She feels that she can try to manage her symptoms on her own with HEP.    Patient Stated Goals decrease pain    Pain Score 3     Pain Location Hip    Pain Orientation Left    Pain Onset More than a month ago              [facility] PT Assessment - 10/18/20 0001      Assessment   Medical Diagnosis Acute LBP with L sciatica    Referring Provider (PT) Arther Abbott MD    Onset Date/Surgical Date 08/10/20    Next MD Visit 11/03/20    Prior Therapy yes for back      Restrictions   Weight  Bearing Restrictions No      Balance Screen   Has the patient fallen in the past 6 months No    Has the patient had a decrease in activity level because of a fear of falling?  No    Is the patient reluctant to leave their home because of a fear of falling?  No      Prior Function   Level of Independence Independent    Vocation Retired      Charity fundraiser Status Within Functional Limits for tasks assessed      Observation/Other Assessments   Observations Ambulates without AD, trunk flexed    Focus on Therapeutic Outcomes (FOTO)  6% limited      AROM   Lumbar Flexion 0% limited    Lumbar Extension 75% limited stretch in anterior L hip    Lumbar - Right Side Bend 0% limited    Lumbar - Left Side Bend 0% limited     Lumbar - Right Rotation 0% limited    Lumbar - Left Rotation 0% limited                                  PT Education - 10/18/20 0839    Education Details Patient educated on HEP, exercise mechanics, review of HEP, returning to PT if needed, remaining active    Person(s) Educated Patient    Methods Explanation;Demonstration;Handout    Comprehension Verbalized understanding;Returned demonstration            PT Short Term Goals - 10/18/20 0849      PT SHORT TERM GOAL #1   Title Patient will be independent with HEP in order to improve functional outcomes.    Time 3    Period Weeks    Status Achieved    Target Date 09/29/20      PT SHORT TERM GOAL #2   Title Patient will report at least 25% improvement in symptoms for improved quality of life.    Baseline 10/14/20 - limited by right leg pain now but 80% better, able to stand and do more. 11/22 97%    Time 3    Period Weeks    Status Achieved    Target Date 09/29/20             PT Long Term Goals - 10/18/20 0850      PT LONG TERM GOAL #1   Title Patient will report at least 75% improvement in symptoms for improved quality of life.    Baseline 10/14/20 - limited by right leg pain now but 80% better, able to stand and do more. 97% improvement    Time 6    Period Weeks    Status Achieved      PT LONG TERM GOAL #2   Title Patient will improve FOTO score by at least 5 points in order to indicate improved tolerance to activity.    Time 6    Period Weeks    Status Achieved      PT LONG TERM GOAL #3   Title Patient will demonstrate at least 25% improvement in lumbar ROM in all restricted planes for improved ability to move trunk while at home.    Time 6    Period Weeks    Status Partially Met                 Plan - 10/18/20 0623  Clinical Impression Statement Patient has met all short and long term goals with ability to complete HEP and improved symptoms, lumbar ROM, functional mobility and activity tolerance. Patient remains limited by anterior hip soreness following  sitting and impaired lumbar extension ROM. Patient not currently having any low back symptoms and does state very low grade numbness in anterior L hip. Reviewed HEP with patient today and educated her on continuing HEP to manage symptoms. Patient discharged from physical therapy at this time.    Personal Factors and Comorbidities Age;Comorbidity 3+;Fitness;Past/Current Experience    Comorbidities hx Back pain, BMI over 30, Cancer, High Blood Pressure    Examination-Activity Limitations Bed Mobility;Lift;Stand;Stairs;Squat;Sit;Locomotion Level;Transfers    Examination-Participation Restrictions Church;Cleaning;Community Activity;Yard Work;Volunteer;Shop    Stability/Clinical Decision Making Stable/Uncomplicated    Rehab Potential Good    PT Frequency 2x / week    PT Duration 6 weeks    PT Treatment/Interventions ADLs/Self Care Home Management;Aquatic Therapy;Biofeedback;Electrical Stimulation;Iontophoresis 14m/ml Dexamethasone;Moist Heat;Traction;Ultrasound;DME Instruction;Gait training;Stair training;Functional mobility training;Therapeutic activities;Therapeutic exercise;Balance training;Orthotic Fit/Training;Patient/family education;Neuromuscular re-education;Manual techniques;Dry needling;Energy conservation;Splinting;Spinal Manipulations;Joint Manipulations    PT Next Visit Plan RE-EVAL 10 visit; hip adductor stretches, hip mobility exercises, possibly manual for pain/mobility    PT Home Exercise Plan hip add isometrics, prone lying with one pillow under pelvis; 09/27/20 - supine bridges; prone on elbows 11/8 piriformis str 11/10 prone quad stretch, self STM with ball; 10/14/20 - prone figure 4 contract/relax for hip adductors, add pillow to prone quad stret for hip flexors 11/22 row, ext, palof           Patient will benefit from skilled therapeutic intervention in order to improve the following deficits and impairments:  Abnormal gait, Decreased activity tolerance, Decreased balance, Decreased  mobility, Decreased strength, Difficulty walking, Pain, Impaired flexibility, Improper body mechanics, Postural dysfunction  Visit Diagnosis: Low back pain, unspecified back pain laterality, unspecified chronicity, unspecified whether sciatica present  Pain in left hip  Muscle weakness (generalized)  Other abnormalities of gait and mobility  Other symptoms and signs involving the musculoskeletal system     Problem List Patient Active Problem List   Diagnosis Date Noted  . Malignant neoplasm of lower-inner quadrant of right breast of female, estrogen receptor positive ([facility] 06/12/2019  . Spondylolisthesis of lumbar region 02/03/2018  . Lumbar spondylosis 02/03/2018  . PSVT ([place]oxysmal [place]ventricular tachycardia) [place] 01/05/2012  . Near syncope 12/05/2011  . Palpitations 12/05/2011  . Abnormal ECG 12/05/2011  . HYPERLIPIDEMIA 11/10/2006  . Essential hypertension, benign 11/10/2006    9:15 AM, 10/18/20 AMearl LatinPT, DPT [facility] [place] [address]Phone: [phone]  Fax:  [phone] Name: Miranda STRUVEMRN: [id number] of Birth: [birth year]

## 2020-10-20 ENCOUNTER — Encounter (HOSPITAL_COMMUNITY): Payer: Medicare Other | Admitting: Physical Therapy

## 2020-10-25 ENCOUNTER — Encounter (HOSPITAL_COMMUNITY): Payer: Medicare Other | Admitting: Physical Therapy

## 2020-10-27 ENCOUNTER — Encounter (HOSPITAL_COMMUNITY): Payer: Medicare Other | Admitting: Physical Therapy

## 2020-11-01 ENCOUNTER — Other Ambulatory Visit: Payer: Self-pay | Admitting: Internal Medicine

## 2020-11-01 ENCOUNTER — Other Ambulatory Visit (HOSPITAL_COMMUNITY): Payer: Self-pay | Admitting: Internal Medicine

## 2020-11-01 DIAGNOSIS — M5442 Lumbago with sciatica, left side: Secondary | ICD-10-CM

## 2020-11-03 ENCOUNTER — Other Ambulatory Visit: Payer: Self-pay

## 2020-11-03 ENCOUNTER — Ambulatory Visit (INDEPENDENT_AMBULATORY_CARE_PROVIDER_SITE_OTHER): Payer: Medicare Other | Admitting: Orthopedic Surgery

## 2020-11-03 ENCOUNTER — Encounter: Payer: Self-pay | Admitting: Orthopedic Surgery

## 2020-11-03 VITALS — BP 156/89 | HR 89 | Ht 61.0 in | Wt 158.0 lb

## 2020-11-03 DIAGNOSIS — M5442 Lumbago with sciatica, left side: Secondary | ICD-10-CM

## 2020-11-03 NOTE — Patient Instructions (Signed)
Take ibuprofen as needed Do the exercises

## 2020-11-03 NOTE — Progress Notes (Signed)
Chief Complaint  Patient presents with  . Back Pain    Recheck on back pain    Encounter Diagnosis  Name Primary?  . Acute left-sided low back pain with left-sided sciatica Yes   86 year old female with back pain and acute left-sided sciatica treated with physical therapy and taking some ibuprofen  She says she is much better although she did have some switching of the pain from the left leg to the right leg it is now significantly improved she does her home exercises and says that she is much better  Recommend she continue the exercises and the medication and to call us if things get worse but right now we are in stable condition

## 2020-11-11 ENCOUNTER — Other Ambulatory Visit: Payer: Self-pay

## 2020-11-11 ENCOUNTER — Ambulatory Visit (HOSPITAL_COMMUNITY)
Admission: RE | Admit: 2020-11-11 | Discharge: 2020-11-11 | Disposition: A | Discharge status: Home / Self Care | Payer: Medicare Other | Source: Ambulatory Visit | Attending: Internal Medicine | Admitting: Internal Medicine

## 2020-11-11 DIAGNOSIS — M5442 Lumbago with sciatica, left side: Secondary | ICD-10-CM | POA: Diagnosis not present

## 2020-11-11 DIAGNOSIS — M545 Low back pain, unspecified: Secondary | ICD-10-CM | POA: Diagnosis not present

## 2020-12-02 DIAGNOSIS — I129 Hypertensive chronic kidney disease with stage 1 through stage 4 chronic kidney disease, or unspecified chronic kidney disease: Secondary | ICD-10-CM | POA: Diagnosis not present

## 2020-12-02 DIAGNOSIS — Z853 Personal history of malignant neoplasm of breast: Secondary | ICD-10-CM | POA: Diagnosis not present

## 2020-12-02 DIAGNOSIS — R7301 Impaired fasting glucose: Secondary | ICD-10-CM | POA: Diagnosis not present

## 2020-12-02 DIAGNOSIS — R002 Palpitations: Secondary | ICD-10-CM | POA: Diagnosis not present

## 2020-12-02 DIAGNOSIS — M79604 Pain in right leg: Secondary | ICD-10-CM | POA: Diagnosis not present

## 2020-12-02 DIAGNOSIS — N182 Chronic kidney disease, stage 2 (mild): Secondary | ICD-10-CM | POA: Diagnosis not present

## 2020-12-02 DIAGNOSIS — N1831 Chronic kidney disease, stage 3a: Secondary | ICD-10-CM | POA: Diagnosis not present

## 2020-12-02 DIAGNOSIS — D0511 Intraductal carcinoma in situ of right breast: Secondary | ICD-10-CM | POA: Diagnosis not present

## 2020-12-02 DIAGNOSIS — I10 Essential (primary) hypertension: Secondary | ICD-10-CM | POA: Diagnosis not present

## 2020-12-02 DIAGNOSIS — M79602 Pain in left arm: Secondary | ICD-10-CM | POA: Diagnosis not present

## 2020-12-02 DIAGNOSIS — E782 Mixed hyperlipidemia: Secondary | ICD-10-CM | POA: Diagnosis not present

## 2020-12-02 DIAGNOSIS — E876 Hypokalemia: Secondary | ICD-10-CM | POA: Diagnosis not present

## 2020-12-02 DIAGNOSIS — R600 Localized edema: Secondary | ICD-10-CM | POA: Diagnosis not present

## 2020-12-06 DIAGNOSIS — M5126 Other intervertebral disc displacement, lumbar region: Secondary | ICD-10-CM | POA: Diagnosis not present

## 2020-12-06 DIAGNOSIS — M48061 Spinal stenosis, lumbar region without neurogenic claudication: Secondary | ICD-10-CM | POA: Diagnosis not present

## 2021-01-11 DIAGNOSIS — C50911 Malignant neoplasm of unspecified site of right female breast: Secondary | ICD-10-CM | POA: Diagnosis not present

## 2021-02-17 DIAGNOSIS — E782 Mixed hyperlipidemia: Secondary | ICD-10-CM | POA: Diagnosis not present

## 2021-02-17 DIAGNOSIS — N1831 Chronic kidney disease, stage 3a: Secondary | ICD-10-CM | POA: Diagnosis not present

## 2021-02-17 DIAGNOSIS — E669 Obesity, unspecified: Secondary | ICD-10-CM | POA: Diagnosis not present

## 2021-02-17 DIAGNOSIS — Z6831 Body mass index (BMI) 31.0-31.9, adult: Secondary | ICD-10-CM | POA: Diagnosis not present

## 2021-02-17 DIAGNOSIS — R7303 Prediabetes: Secondary | ICD-10-CM | POA: Diagnosis not present

## 2021-02-17 DIAGNOSIS — Z85118 Personal history of other malignant neoplasm of bronchus and lung: Secondary | ICD-10-CM | POA: Diagnosis not present

## 2021-02-17 DIAGNOSIS — I129 Hypertensive chronic kidney disease with stage 1 through stage 4 chronic kidney disease, or unspecified chronic kidney disease: Secondary | ICD-10-CM | POA: Diagnosis not present

## 2021-02-17 DIAGNOSIS — I10 Essential (primary) hypertension: Secondary | ICD-10-CM | POA: Diagnosis not present

## 2021-02-17 DIAGNOSIS — Z6832 Body mass index (BMI) 32.0-32.9, adult: Secondary | ICD-10-CM | POA: Diagnosis not present

## 2021-02-17 DIAGNOSIS — R944 Abnormal results of kidney function studies: Secondary | ICD-10-CM | POA: Diagnosis not present

## 2021-02-17 DIAGNOSIS — N182 Chronic kidney disease, stage 2 (mild): Secondary | ICD-10-CM | POA: Diagnosis not present

## 2021-02-17 DIAGNOSIS — E876 Hypokalemia: Secondary | ICD-10-CM | POA: Diagnosis not present

## 2021-02-22 DIAGNOSIS — M5442 Lumbago with sciatica, left side: Secondary | ICD-10-CM | POA: Diagnosis not present

## 2021-02-22 DIAGNOSIS — Z853 Personal history of malignant neoplasm of breast: Secondary | ICD-10-CM | POA: Diagnosis not present

## 2021-02-22 DIAGNOSIS — Z85118 Personal history of other malignant neoplasm of bronchus and lung: Secondary | ICD-10-CM | POA: Diagnosis not present

## 2021-02-22 DIAGNOSIS — M79602 Pain in left arm: Secondary | ICD-10-CM | POA: Diagnosis not present

## 2021-02-22 DIAGNOSIS — N182 Chronic kidney disease, stage 2 (mild): Secondary | ICD-10-CM | POA: Diagnosis not present

## 2021-02-22 DIAGNOSIS — E6609 Other obesity due to excess calories: Secondary | ICD-10-CM | POA: Diagnosis not present

## 2021-02-22 DIAGNOSIS — I129 Hypertensive chronic kidney disease with stage 1 through stage 4 chronic kidney disease, or unspecified chronic kidney disease: Secondary | ICD-10-CM | POA: Diagnosis not present

## 2021-02-22 DIAGNOSIS — E876 Hypokalemia: Secondary | ICD-10-CM | POA: Diagnosis not present

## 2021-02-22 DIAGNOSIS — E782 Mixed hyperlipidemia: Secondary | ICD-10-CM | POA: Diagnosis not present

## 2021-02-22 DIAGNOSIS — R7301 Impaired fasting glucose: Secondary | ICD-10-CM | POA: Diagnosis not present

## 2021-02-22 DIAGNOSIS — R002 Palpitations: Secondary | ICD-10-CM | POA: Diagnosis not present

## 2021-02-22 DIAGNOSIS — R7303 Prediabetes: Secondary | ICD-10-CM | POA: Diagnosis not present

## 2021-03-24 IMAGING — US US BREAST*R* LIMITED INC AXILLA
1 series · 14 of 22 positions shown · non-contrast
Comparison: Previous exam(s).

CLINICAL DATA: 85-year-old female for further evaluation of
possible RIGHT breast mass on screening mammogram

EXAM:
DIGITAL DIAGNOSTIC RIGHT MAMMOGRAM WITH CAD AND TOMO
ULTRASOUND RIGHT BREAST

[Series 1: us breast*right* limited inc axilla · 0.05mm/px · 14 of 22 slices shown]
[im 1/22]
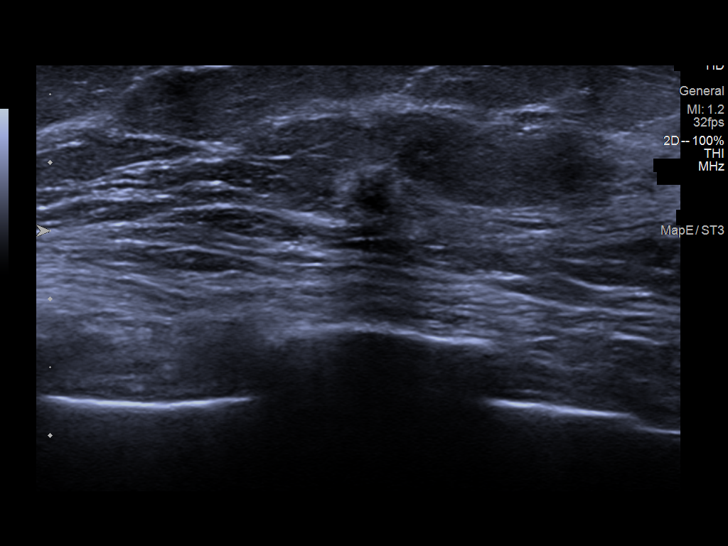
[im 3/22]
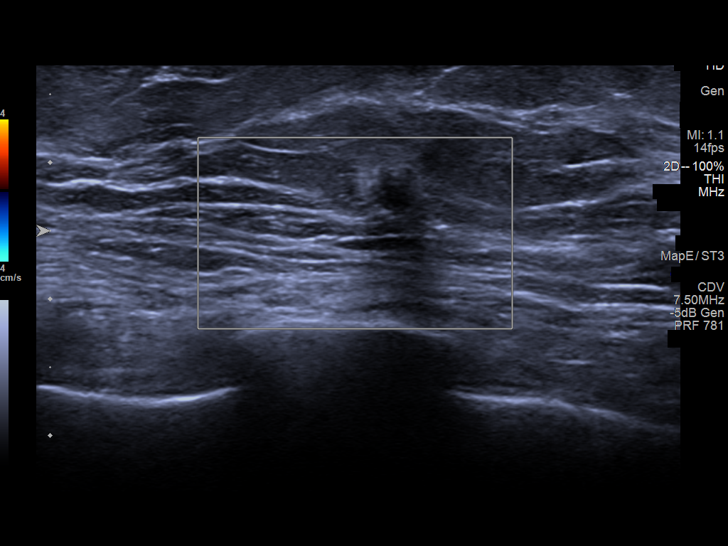
[im 4/22]
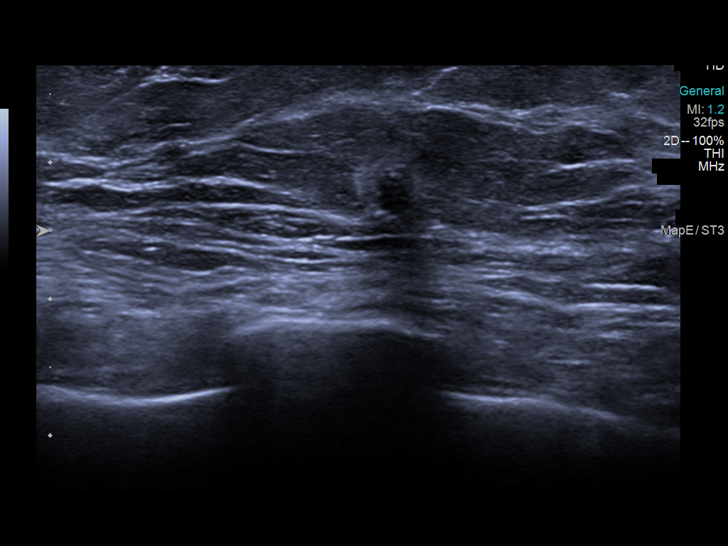
[im 6/22]
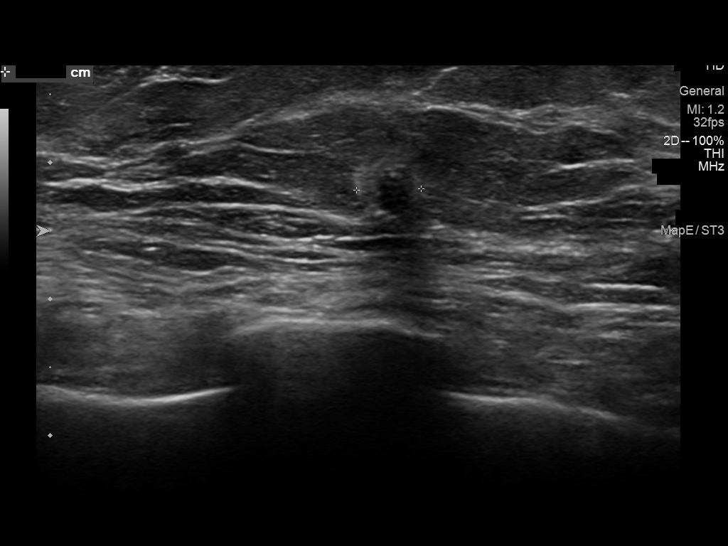
[im 8/22]
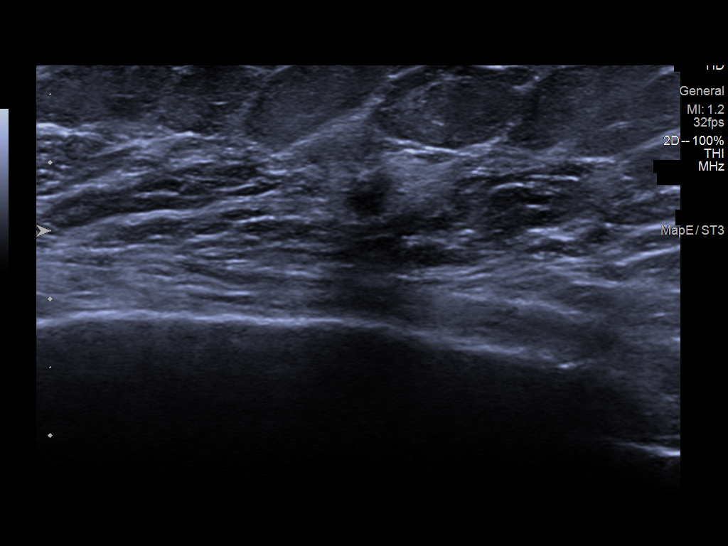
[im 9/22]
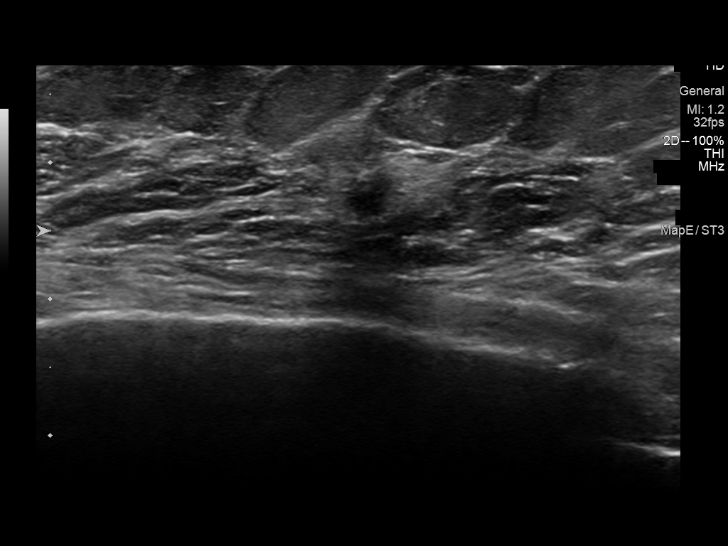
[im 11/22]
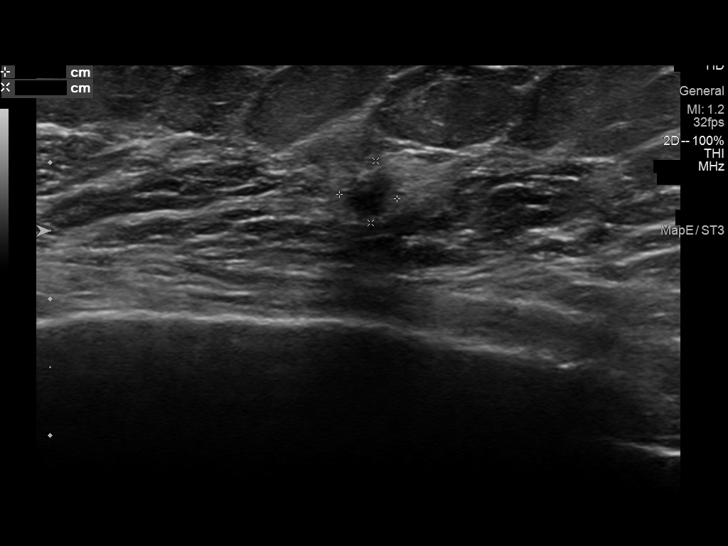
[im 12/22]
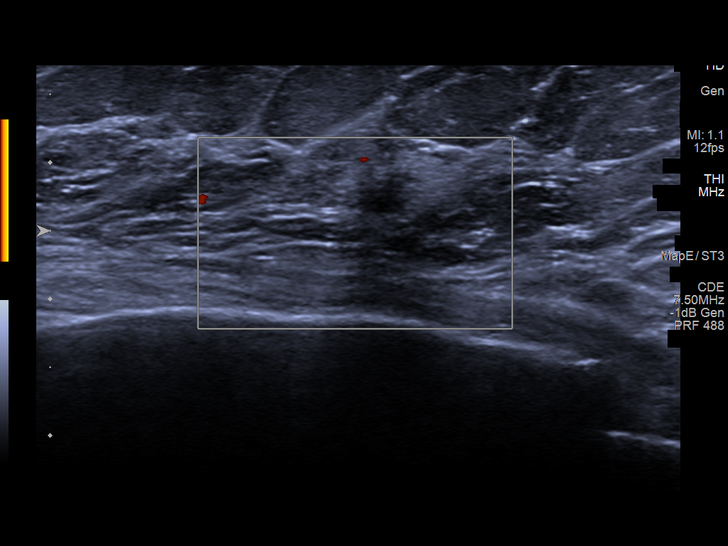
[im 14/22]
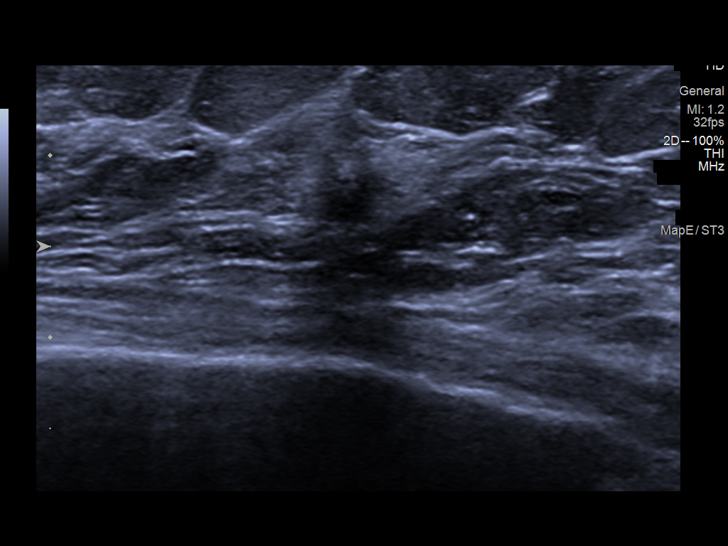
[im 15/22]
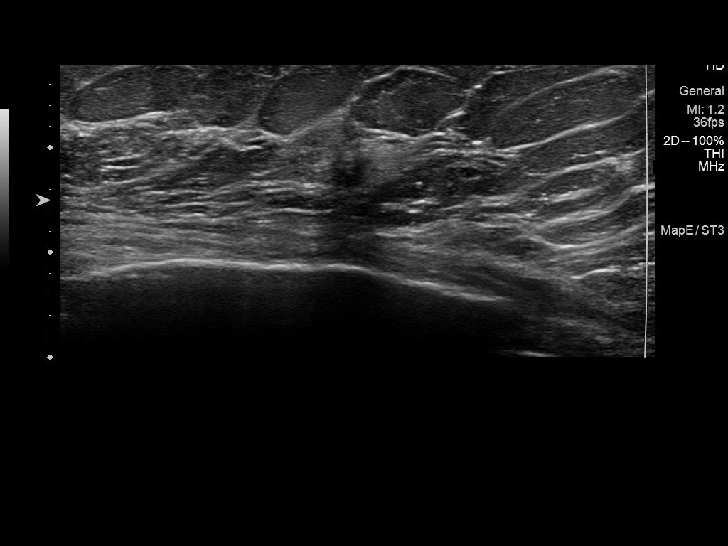
[im 17/22]
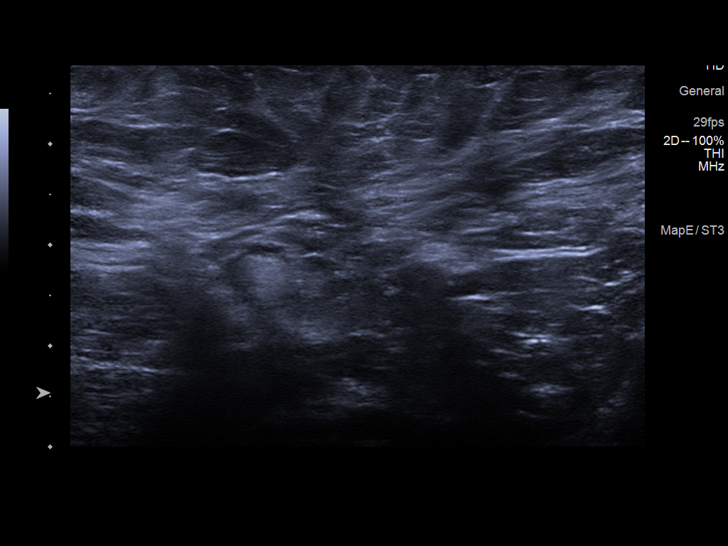
[im 19/22]
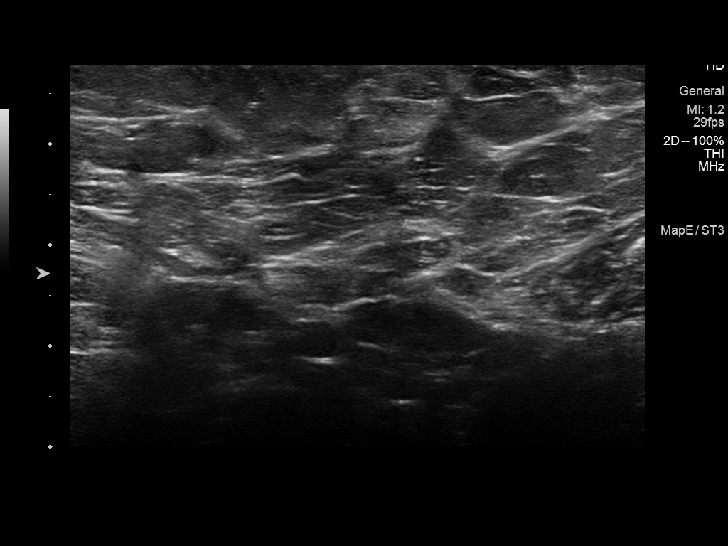
[im 20/22]
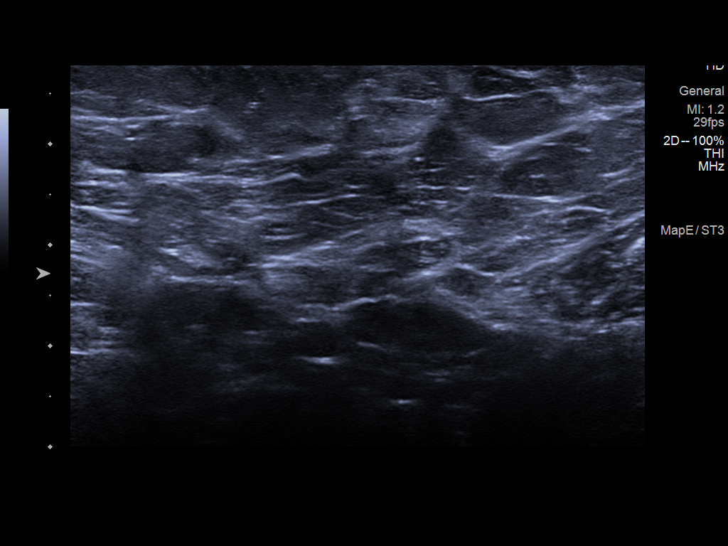
[im 22/22]
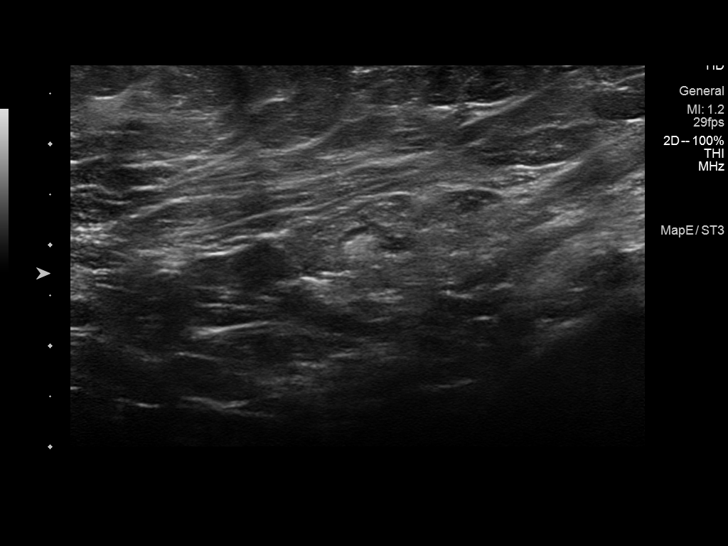

[14 of 22 positions shown; findings below may reference images not displayed]

ACR Breast Density Category b: There are scattered areas of
fibroglandular density.
FINDINGS: 2D/3D spot compression views of the RIGHT breast demonstrate a
persistent 0.6 cm obscured mass within the posterior LOWER OUTER
LEFT breast.

Mammographic images were processed with CAD.

Targeted ultrasound is performed, showing a 0.4 x 0.5 x 0.5 cm
irregular hypoechoic mass at the 8 o'clock position of the RIGHT
breast 9 cm from the nipple.

No abnormal RIGHT axillary lymph nodes are identified.
IMPRESSION: 1. Suspicious 0.5 cm mass within the LOWER OUTER RIGHT breast.
Tissue sampling recommended. No abnormal RIGHT axillary lymph nodes.

RECOMMENDATION:
Ultrasound-guided RIGHT breast biopsy, which will be scheduled.

I have discussed the findings and recommendations with the patient.
If applicable, a reminder letter will be sent to the patient
regarding the next appointment.

BI-RADS CATEGORY  4: Suspicious.

## 2021-03-24 IMAGING — MG DIGITAL DIAGNOSTIC UNILATERAL RIGHT MAMMOGRAM WITH TOMO AND CAD
4 series · 4 of 12 positions shown · non-contrast
Comparison: Previous exam(s).

CLINICAL DATA: 85-year-old female for further evaluation of
possible RIGHT breast mass on screening mammogram

EXAM:
DIGITAL DIAGNOSTIC RIGHT MAMMOGRAM WITH CAD AND TOMO
ULTRASOUND RIGHT BREAST

[R CC synth-2D]
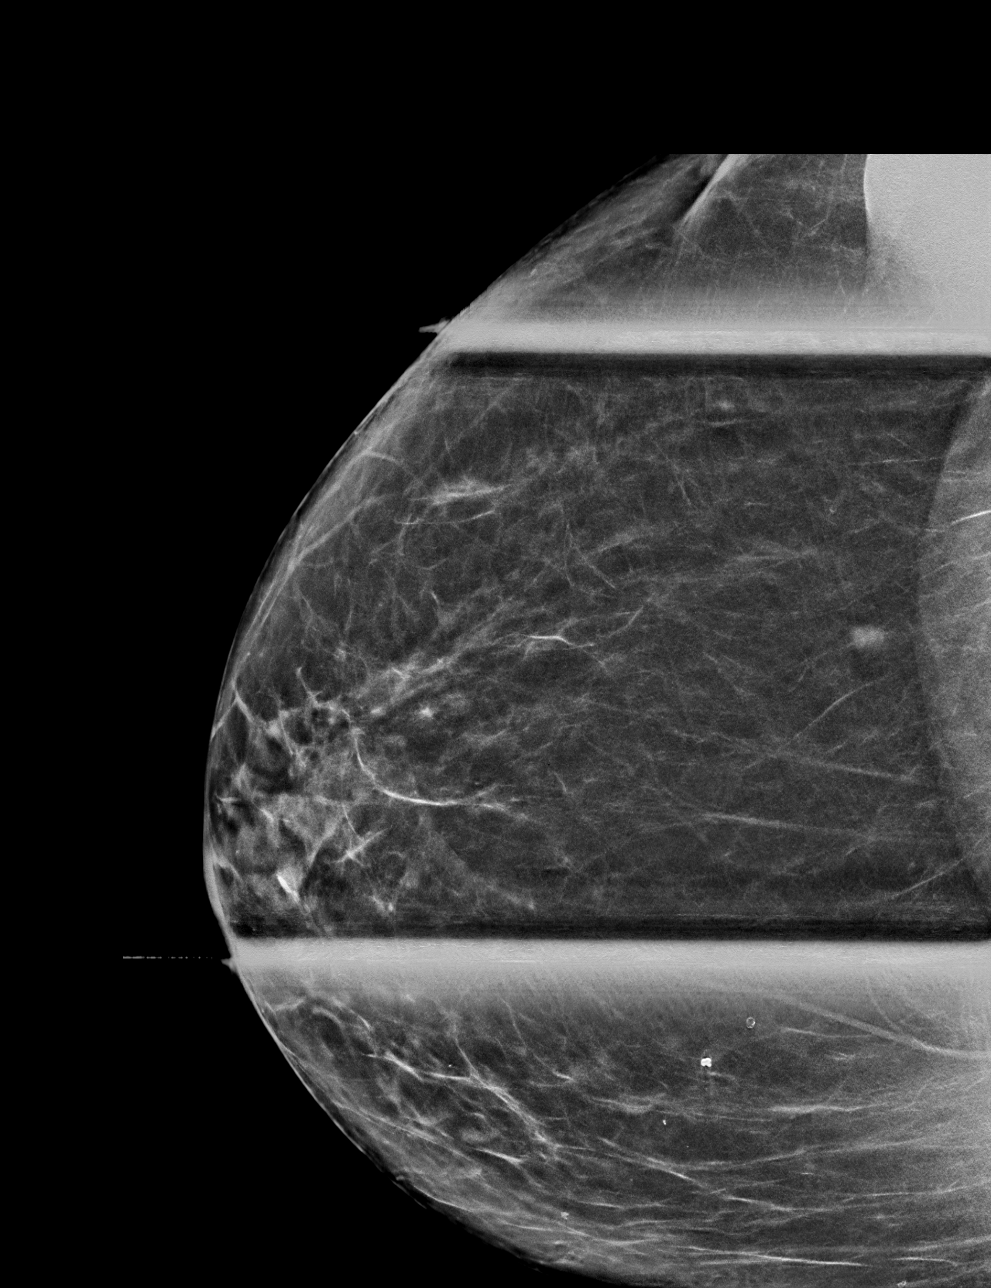

[R MLO synth-2D]
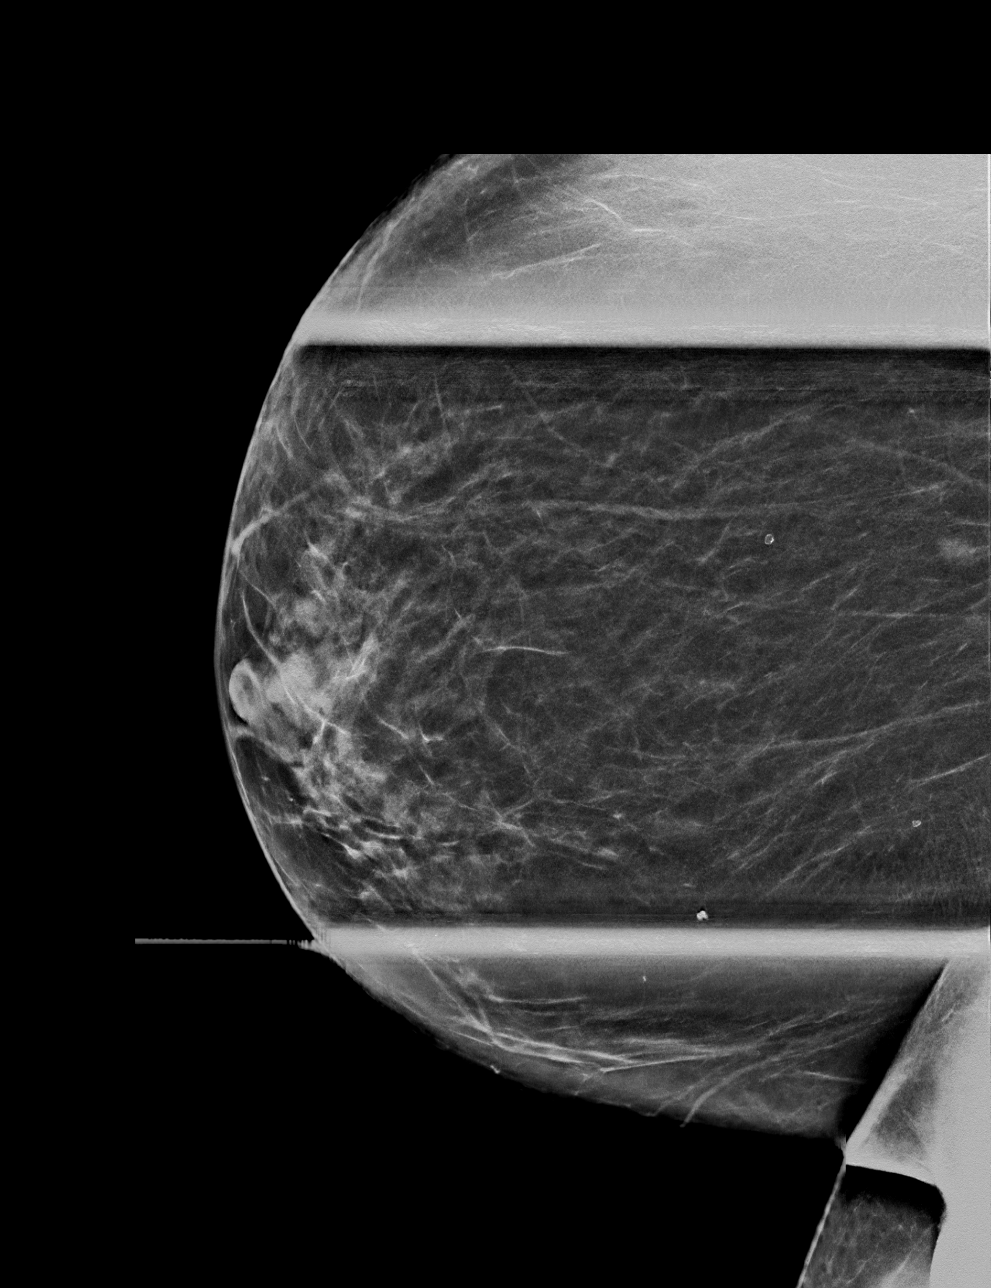

[R CC tomo · tomo slice 33/66.0]
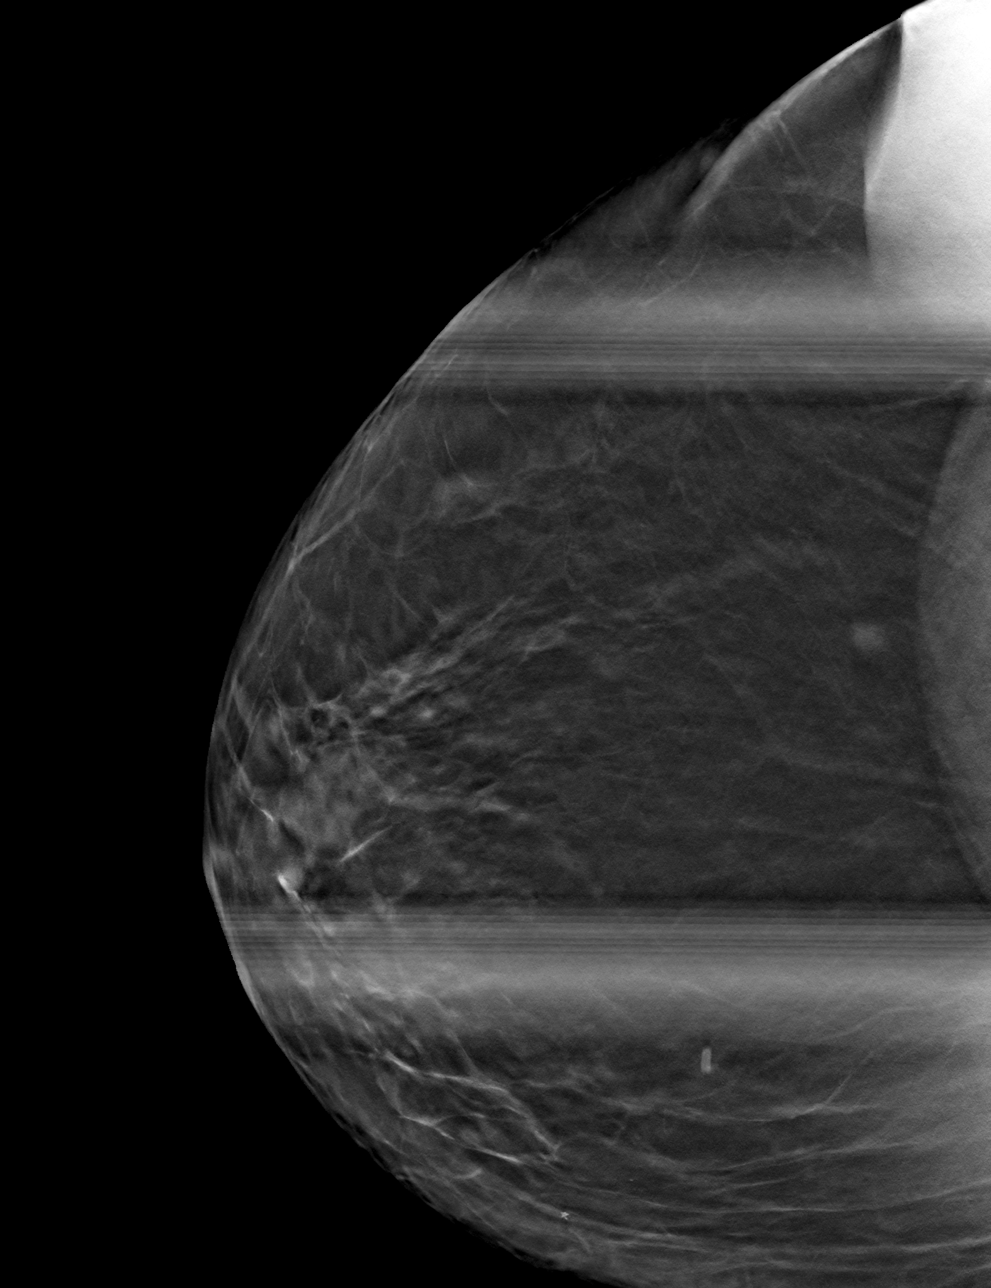

[R MLO tomo · tomo slice 31/61.0]
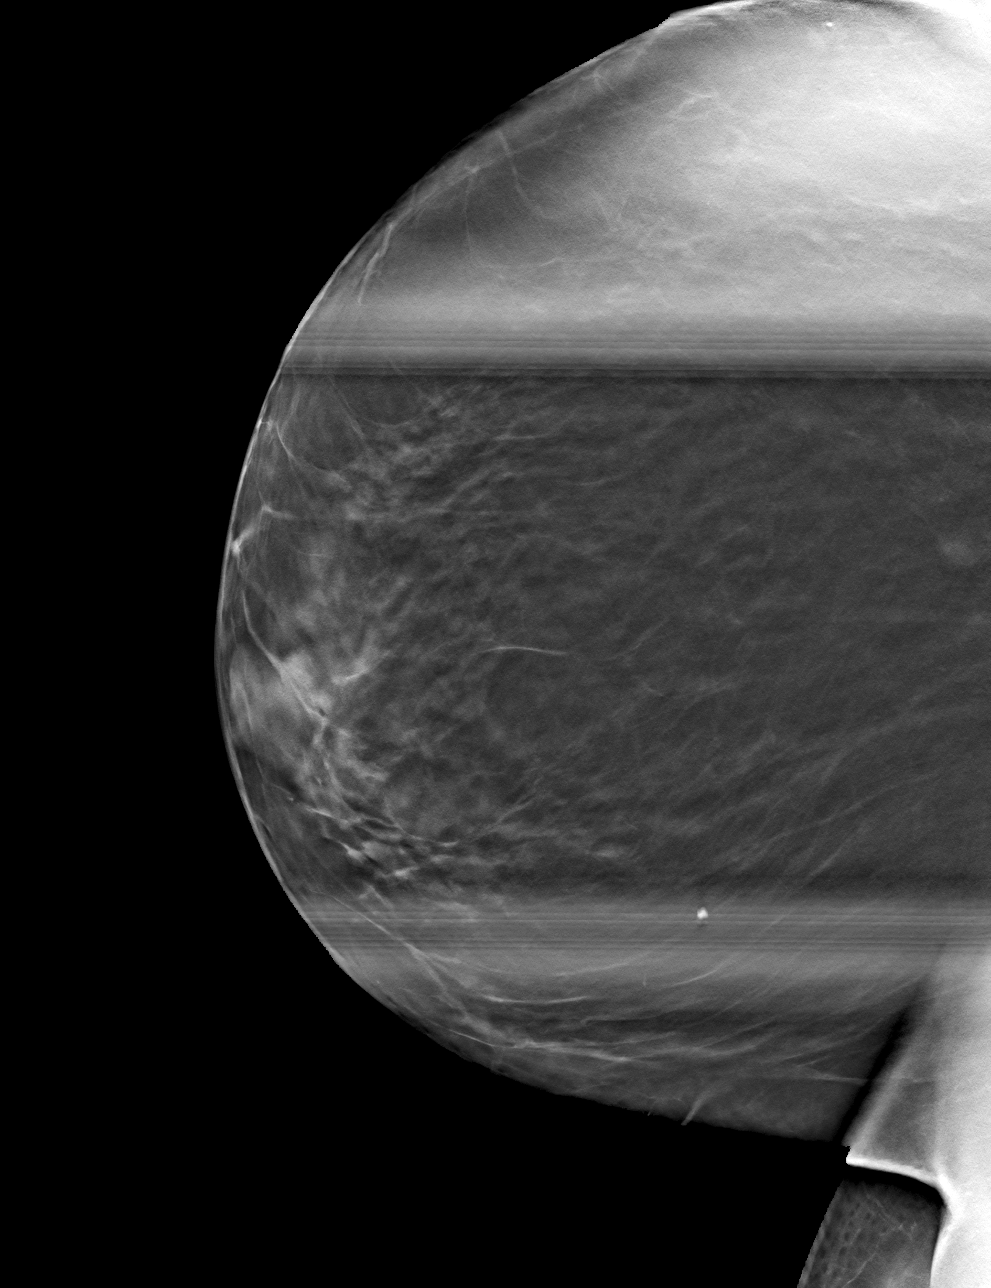

[4 of 12 positions shown; findings below may reference images not displayed]

ACR Breast Density Category b: There are scattered areas of
fibroglandular density.
FINDINGS: 2D/3D spot compression views of the RIGHT breast demonstrate a
persistent 0.6 cm obscured mass within the posterior LOWER OUTER
LEFT breast.

Mammographic images were processed with CAD.

Targeted ultrasound is performed, showing a 0.4 x 0.5 x 0.5 cm
irregular hypoechoic mass at the 8 o'clock position of the RIGHT
breast 9 cm from the nipple.

No abnormal RIGHT axillary lymph nodes are identified.
IMPRESSION: 1. Suspicious 0.5 cm mass within the LOWER OUTER RIGHT breast.
Tissue sampling recommended. No abnormal RIGHT axillary lymph nodes.

RECOMMENDATION:
Ultrasound-guided RIGHT breast biopsy, which will be scheduled.

I have discussed the findings and recommendations with the patient.
If applicable, a reminder letter will be sent to the patient
regarding the next appointment.

BI-RADS CATEGORY  4: Suspicious.

## 2021-03-31 DIAGNOSIS — Z23 Encounter for immunization: Secondary | ICD-10-CM | POA: Diagnosis not present

## 2021-05-12 ENCOUNTER — Other Ambulatory Visit: Payer: Self-pay | Admitting: Hematology and Oncology

## 2021-05-26 ENCOUNTER — Telehealth: Payer: Self-pay | Admitting: Hematology and Oncology

## 2021-05-26 ENCOUNTER — Other Ambulatory Visit: Payer: Self-pay | Admitting: *Deleted

## 2021-05-26 ENCOUNTER — Other Ambulatory Visit: Payer: Self-pay | Admitting: Hematology and Oncology

## 2021-05-26 DIAGNOSIS — Z170 Estrogen receptor positive status [ER+]: Secondary | ICD-10-CM

## 2021-05-26 NOTE — Telephone Encounter (Signed)
Scheduled appt per 6/30 sch msg. Pt aware.

## 2021-06-02 DIAGNOSIS — H35033 Hypertensive retinopathy, bilateral: Secondary | ICD-10-CM | POA: Diagnosis not present

## 2021-06-02 DIAGNOSIS — H402231 Chronic angle-closure glaucoma, bilateral, mild stage: Secondary | ICD-10-CM | POA: Diagnosis not present

## 2021-06-02 DIAGNOSIS — H02403 Unspecified ptosis of bilateral eyelids: Secondary | ICD-10-CM | POA: Diagnosis not present

## 2021-06-02 DIAGNOSIS — H04123 Dry eye syndrome of bilateral lacrimal glands: Secondary | ICD-10-CM | POA: Diagnosis not present

## 2021-06-03 ENCOUNTER — Ambulatory Visit (HOSPITAL_COMMUNITY)
Admission: RE | Admit: 2021-06-03 | Discharge: 2021-06-03 | Disposition: A | Discharge status: Home / Self Care | Payer: Medicare Other | Source: Ambulatory Visit | Attending: Student | Admitting: Student

## 2021-06-03 ENCOUNTER — Other Ambulatory Visit (HOSPITAL_COMMUNITY): Payer: Self-pay | Admitting: Student

## 2021-06-03 ENCOUNTER — Other Ambulatory Visit: Payer: Self-pay

## 2021-06-03 DIAGNOSIS — M79642 Pain in left hand: Secondary | ICD-10-CM | POA: Insufficient documentation

## 2021-06-03 DIAGNOSIS — R200 Anesthesia of skin: Secondary | ICD-10-CM

## 2021-06-03 DIAGNOSIS — M25532 Pain in left wrist: Secondary | ICD-10-CM | POA: Diagnosis not present

## 2021-06-10 DIAGNOSIS — Z20822 Contact with and (suspected) exposure to covid-19: Secondary | ICD-10-CM | POA: Diagnosis not present

## 2021-06-23 ENCOUNTER — Other Ambulatory Visit: Payer: Self-pay

## 2021-06-23 ENCOUNTER — Encounter: Payer: Self-pay | Admitting: Orthopedic Surgery

## 2021-06-23 ENCOUNTER — Telehealth: Payer: Self-pay | Admitting: Orthopedic Surgery

## 2021-06-23 ENCOUNTER — Ambulatory Visit (INDEPENDENT_AMBULATORY_CARE_PROVIDER_SITE_OTHER): Payer: Medicare Other | Admitting: Orthopedic Surgery

## 2021-06-23 VITALS — BP 164/87 | HR 64 | Ht 60.0 in | Wt 162.0 lb

## 2021-06-23 DIAGNOSIS — G5602 Carpal tunnel syndrome, left upper limb: Secondary | ICD-10-CM

## 2021-06-23 DIAGNOSIS — M5442 Lumbago with sciatica, left side: Secondary | ICD-10-CM

## 2021-06-23 MED ORDER — IBUPROFEN 800 MG PO TABS
800.0000 mg | ORAL_TABLET | Freq: Three times a day (TID) | ORAL | 1 refills | Status: DC | PRN
Start: 2021-06-23 — End: 2021-09-15

## 2021-06-23 NOTE — Addendum Note (Signed)
Addended by: Carole Civil on: 06/23/2021 04:31 PM   Modules accepted: Orders

## 2021-06-23 NOTE — Patient Instructions (Signed)
Driving Directions to [employer] from [employer] address is [place] The phone number is [phone]  Dr Ernestina Patches  1. Start out going [place] on [address] toward [address].  Then 0.02 miles0.02 total miles 2. Take the 1st right onto [address]. Continue to follow [address].  If you reach [facility] you've gone a little too far  Then 0.58 miles0.60 total miles 3. Turn right onto [address] is just past [facility]  Then 2.25 miles2.85 total miles 4. Take the [address] ramp toward [place].  Then 0.25 miles3.10 total miles 5. Merge onto [address]  Then 18.17 miles21.28 total miles 6. Merge onto [address]  Then 1.47 miles22.74 total miles 7. Turn right onto [address] is just past [place]  Then 0.11 miles22.85 total miles  8. [address] [place], [place] [address], [place] is on the left.

## 2021-06-23 NOTE — Telephone Encounter (Signed)
Ms. Miranda [name] called and stated that she was told today that there was a prescription for Ibuprofen 800 mgs. Being sent to [place] for her.  She says this has not been done.    Can you check on this for her please?  Thanks

## 2021-06-23 NOTE — Progress Notes (Addendum)
Patient ID: Miranda [name], female   DOB: 04-14-1934, 87 y.o.   MRN: [id number]  ASSESSMENT AND PLAN   NCS/EMG  Encounter Diagnosis  Name Primary?   Carpal tunnel syndrome of left wrist Yes     Chief Complaint  Patient presents with   Hand Pain    Left hand going numb and sometimes painful/wears brace during the day and a different brace at night and that seems to help    HPI Miranda [name] is a 87 y.o. female.  RHD PAIN NUMBNESS NAD TINGLING LEFT HAND STARTED IN April , SHE WORE A BRACE BUT HASNT SEEN MUCH RELIEF. SHE NOW NOTICES WEAKNESS IN THE LEFT HAND MORE NOCTURNAL SYMPTOMS   Review of Systems Review of Systems  Musculoskeletal:  Positive for arthralgias and back pain.    Past Medical History:  Diagnosis Date   Asthma    Cancer ([place])    Essential hypertension, benign    Mixed hyperlipidemia    Nephrolithiasis     Past Surgical History:  Procedure Laterality Date   ABDOMINAL HYSTERECTOMY     excessive bleeding, no cancer    BREAST LUMPECTOMY Right 2020   Left knee arthroscopy due to torn ligament and cartiallege  2006   [name]    Left lung - lobectomy lower lobe - lung cancer     RADIOACTIVE SEED GUIDED PARTIAL MASTECTOMY WITH AXILLARY SENTINEL LYMPH NODE BIOPSY Right 06/24/2019   Procedure: RADIOACTIVE SEED GUIDED RIGHT BREAST PARTIAL MASTECTOMY WITH  SENTINEL LYMPH NODE BIOPSY;  Surgeon: Coralie Keens, MD;  Location: [facility];  Service: General;  Laterality: Right;    Family History  Problem Relation Age of Onset   Colon cancer Father    Heart attack Mother    Anuerysm Mother        Brain    Stroke Sister    Hypertension Sister    Melanoma Sister    Hypertension Sister    Hypertension Son    Cancer Other        Family history    Arthritis Other        Family history      Social History   Tobacco Use   Smoking status: Never   Smokeless tobacco: Never  Vaping Use   Vaping Use: Never used  Substance Use Topics   Alcohol use: Yes     Comment: occasional   Drug use: No    Allergies  Allergen Reactions   Bee Venom Swelling    Current Outpatient Medications  Medication Sig Dispense Refill   acetaminophen (TYLENOL) 500 MG tablet Take 500 mg by mouth every 6 (six) hours as needed for moderate pain or headache.     anastrozole (ARIMIDEX) 1 MG tablet TAKE 1 TABLET BY MOUTH EVERY DAY 30 tablet 0   aspirin 81 MG EC tablet Take 81 mg by mouth daily.       cetirizine (ZYRTEC) 10 MG tablet Take 10 mg by mouth daily as needed for allergies.     diclofenac (VOLTAREN) 50 MG EC tablet Take 50 mg by mouth 2 (two) times daily.     fluocinonide cream (LIDEX) 0.13 % Apply 1 application topically See admin instructions. Apply topically twice a day for 2 weeks, then off 1 week. Repeat once  5   fluticasone (FLONASE) 50 MCG/ACT nasal spray Place 1 spray into both nostrils daily as needed for allergies or rhinitis.     ibuprofen (ADVIL) 800 MG tablet Take 1 tablet (800 mg total)  by mouth every 8 (eight) hours as needed. 90 tablet 1   KLOR-CON M20 20 MEQ tablet Take 20 mEq by mouth daily.   5   latanoprost (XALATAN) 0.005 % ophthalmic solution Place 1 drop into both eyes at bedtime.  3   metoprolol tartrate (LOPRESSOR) 25 MG tablet Take 25 mg by mouth daily as needed (rapid heart rate).   3   Polyvinyl Alcohol-Povidone (REFRESH OP) Place 1 drop into both eyes daily as needed (dry eyes).     rosuvastatin (CRESTOR) 10 MG tablet Take 10 mg by mouth daily.     rosuvastatin (CRESTOR) 5 MG tablet Take 5 mg by mouth daily.     telmisartan-hydrochlorothiazide (MICARDIS HCT) 80-25 MG tablet Take 1 tablet by mouth daily.  2   timolol (TIMOPTIC) 0.5 % ophthalmic solution 1 drop daily.     torsemide (DEMADEX) 20 MG tablet Take 20 mg by mouth daily as needed (swelling).     No current facility-administered medications for this visit.     Physical Exam BP (!) 164/87   Pulse 64   Ht 5' (1.524 m)   Wt 162 lb (73.5 kg)   BMI 31.64 kg/m   The  patient is well developed well nourished and well groomed.  Orientation to person place and time is normal  Mood is pleasant.  Ambulatory status normal gait and stance   LEFT Upper extremity examination reveals the following:  SWELLING NO TENDERNESS OVER THE CARPAL TUNNEL NO  Range of motion of the wrist NL  Motor exam NL , BUT IT LOOKS LIKE ATROPHY IN THE THENAR EMINENCE  Wrist joint is stable  Provocative tests for carpal tunnel Phalen's test  NEG Carpal tunnel compression test NEG Tinel's test NEG  Pulses are normal in the radial and ulnar artery with a normal Allen's test.  SENSORY EXAM:   Opposite extremity NL   Plan   MEDICAL DECISION SECTION   XRAYS: N/A  Encounter Diagnosis  Name Primary?   Carpal tunnel syndrome of left wrist Yes     PLAN:   Wrist splint NCS  F/U AFTER NCS FOR RESULTS AND PREOP FOR CTR LEFT HAND  Meds ordered this encounter  Medications   ibuprofen (ADVIL) 800 MG tablet    Sig: Take 1 tablet (800 mg total) by mouth every 8 (eight) hours as needed.    Dispense:  90 tablet    Refill:  1

## 2021-06-24 ENCOUNTER — Other Ambulatory Visit: Payer: Self-pay | Admitting: Hematology and Oncology

## 2021-06-24 DIAGNOSIS — Z170 Estrogen receptor positive status [ER+]: Secondary | ICD-10-CM

## 2021-07-19 ENCOUNTER — Other Ambulatory Visit (HOSPITAL_COMMUNITY): Payer: Self-pay | Admitting: Hematology and Oncology

## 2021-07-26 ENCOUNTER — Ambulatory Visit (HOSPITAL_COMMUNITY)
Admission: RE | Admit: 2021-07-26 | Discharge: 2021-07-26 | Disposition: A | Discharge status: Home / Self Care | Payer: Medicare Other | Source: Ambulatory Visit | Attending: Hematology and Oncology | Admitting: Hematology and Oncology

## 2021-07-26 ENCOUNTER — Other Ambulatory Visit: Payer: Self-pay

## 2021-07-26 DIAGNOSIS — Z170 Estrogen receptor positive status [ER+]: Secondary | ICD-10-CM | POA: Insufficient documentation

## 2021-07-26 DIAGNOSIS — Z853 Personal history of malignant neoplasm of breast: Secondary | ICD-10-CM | POA: Diagnosis not present

## 2021-07-26 DIAGNOSIS — R922 Inconclusive mammogram: Secondary | ICD-10-CM | POA: Diagnosis not present

## 2021-07-26 DIAGNOSIS — C50311 Malignant neoplasm of lower-inner quadrant of right female breast: Secondary | ICD-10-CM

## 2021-08-03 NOTE — Progress Notes (Signed)
Patient Care Team: Celene Squibb, MD as PCP - General (Internal Medicine) Nicholas Lose, MD as Consulting Physician (Hematology and Oncology) Coralie Keens, MD as Consulting Physician (General Surgery)  DIAGNOSIS:    ICD-10-CM   1. Malignant neoplasm of lower-inner quadrant of right breast of female, estrogen receptor positive ([place])  C50.311    Z17.0       SUMMARY OF ONCOLOGIC HISTORY: Oncology History  Malignant neoplasm of lower-inner quadrant of right breast of female, estrogen receptor positive ([place])  06/12/2019 Initial Diagnosis   Screening mammogram detected right breast asymmetry. Diagnostic mammogram and US showed 0.5cm right breast mass. Biopsy showed IDC, grade 1, HER-2 - (0), ER+ 100%, PR+ 70%, Ki67 5%.    06/12/2019 Cancer Staging   Staging form: Breast, AJCC 8th Edition - Clinical stage from 06/12/2019: Stage IA (cT1a, cN0, cM0, G2, ER+, PR+, HER2-) - Signed by Nicholas Lose, MD on 06/17/2019   06/24/2019 Surgery   Right lumpectomy Ninfa Linden): IDC with intermediate grade DCIS, 0.6cm, grade 2, lymphovascular space invasion present, clear margins, and one right axillary lymph node negative.    06/24/2019 Cancer Staging   Staging form: Breast, AJCC 8th Edition - Pathologic stage from 06/24/2019: Stage IA (pT1a, pN0, cM0, G2, ER+, PR+, HER2-) - Signed by Gardenia Phlegm, NP on 10/01/2019   06/2019 -  Anti-estrogen oral therapy   Anastrozole daily     CHIEF COMPLIANT: Follow-up of right breast cancer  INTERVAL HISTORY: Miranda [name] is a 87 y.o. with above-mentioned history of right breast cancer having undergone lumpectomy, currently on antiestrogen therapy with anastrozole. Mammogram on 07/16/2021 showed no evidence of malignancy. She presents to the clinic today for follow-up.  She is tolerating anastrozole extremely well without any problems or concerns.  Denies any lumps or nodules in the breast.  ALLERGIES:  is allergic to bee venom.  MEDICATIONS:   Current Outpatient Medications  Medication Sig Dispense Refill   acetaminophen (TYLENOL) 500 MG tablet Take 500 mg by mouth every 6 (six) hours as needed for moderate pain or headache.     anastrozole (ARIMIDEX) 1 MG tablet TAKE 1 TABLET BY MOUTH EVERY DAY 30 tablet 0   aspirin 81 MG EC tablet Take 81 mg by mouth daily.       cetirizine (ZYRTEC) 10 MG tablet Take 10 mg by mouth daily as needed for allergies.     diclofenac (VOLTAREN) 50 MG EC tablet Take 50 mg by mouth 2 (two) times daily.     fluocinonide cream (LIDEX) 6.06 % Apply 1 application topically See admin instructions. Apply topically twice a day for 2 weeks, then off 1 week. Repeat once  5   fluticasone (FLONASE) 50 MCG/ACT nasal spray Place 1 spray into both nostrils daily as needed for allergies or rhinitis.     ibuprofen (ADVIL) 800 MG tablet Take 1 tablet (800 mg total) by mouth every 8 (eight) hours as needed. 90 tablet 1   ibuprofen (ADVIL) 800 MG tablet Take 1 tablet (800 mg total) by mouth every 8 (eight) hours as needed. 90 tablet 1   KLOR-CON M20 20 MEQ tablet Take 20 mEq by mouth daily.   5   latanoprost (XALATAN) 0.005 % ophthalmic solution Place 1 drop into both eyes at bedtime.  3   metoprolol tartrate (LOPRESSOR) 25 MG tablet Take 25 mg by mouth daily as needed (rapid heart rate).   3   Polyvinyl Alcohol-Povidone (REFRESH OP) Place 1 drop into both eyes daily as needed (  dry eyes).     rosuvastatin (CRESTOR) 10 MG tablet Take 10 mg by mouth daily.     rosuvastatin (CRESTOR) 5 MG tablet Take 5 mg by mouth daily.     telmisartan-hydrochlorothiazide (MICARDIS HCT) 80-25 MG tablet Take 1 tablet by mouth daily.  2   timolol (TIMOPTIC) 0.5 % ophthalmic solution 1 drop daily.     torsemide (DEMADEX) 20 MG tablet Take 20 mg by mouth daily as needed (swelling).     No current facility-administered medications for this visit.    PHYSICAL EXAMINATION: ECOG PERFORMANCE STATUS: 1 - Symptomatic but completely  ambulatory  Vitals:   08/04/21 1045  BP: (!) 175/96  Pulse: 94  Resp: 18  Temp: 97.9 F (36.6 C)  SpO2: 96%   Filed Weights   08/04/21 1045  Weight: 160 lb 12.8 oz (72.9 kg)    BREAST: No palpable masses or nodules in either right or left breasts. No palpable axillary supraclavicular or infraclavicular adenopathy no breast tenderness or nipple discharge. (exam performed in the presence of a chaperone)  LABORATORY DATA:  I have reviewed the data as listed CMP Latest Ref Rng & Units 06/19/2019 03/22/2017 07/09/2014  Glucose 70 - 99 mg/dL 128(H) 167(H) -  BUN 8 - 23 mg/dL 12 11 -  Creatinine 0.44 - 1.00 mg/dL 0.97 0.86 1.00  Sodium 135 - 145 mmol/L 138 140 -  Potassium 3.5 - 5.1 mmol/L 3.0(L) 2.9(L) -  Chloride 98 - 111 mmol/L 98 99(L) -  CO2 22 - 32 mmol/L 29 31 -  Calcium 8.9 - 10.3 mg/dL 9.0 8.8(L) -  Total Protein 6.5 - 8.1 g/dL - 7.3 -  Total Bilirubin 0.3 - 1.2 mg/dL - 1.0 -  Alkaline Phos 38 - 126 U/L - 71 -  AST 15 - 41 U/L - 25 -  ALT 14 - 54 U/L - 26 -    Lab Results  Component Value Date   WBC 5.8 06/19/2019   HGB 13.4 06/19/2019   HCT 42.0 06/19/2019   MCV 83.5 06/19/2019   PLT 232 06/19/2019   NEUTROABS 2.7 12/19/2008    ASSESSMENT & PLAN:  Malignant neoplasm of lower-inner quadrant of right breast of female, estrogen receptor positive ([place]) 06/16/2019: Right lumpectomy:Right lumpectomy Ninfa Linden): IDC with intermediate grade DCIS, 0.6cm, grade 2, lymphovascular space invasion present, clear margins, and one right axillary lymph node negative. grade 1, HER-2 - (0), ER+ 100%, PR+ 70%, Ki67 5%. T1BN0 stage Ia  Current treatment: Anastrozole 1 mg daily started 07/01/2019 Anastrozole toxicities: Denies any adverse effects to anastrozole therapy  Breast cancer surveillance: 1.  Breast exam:  08/04/2021: Benign 2. mammogram and ultrasound 07/26/2021: Benign breast density category B  Return to clinic in 1 year for follow-up    No orders of the defined types  were placed in this encounter.  The patient has a good understanding of the overall plan. she agrees with it. she will call with any problems that may develop before the next visit here.  Total time spent: 20 mins including face to face time and time spent for planning, charting and coordination of care  Rulon Eisenmenger, MD, MPH 08/04/2021  I, Thana Ates, am acting as scribe for Dr. Nicholas Lose.  I have reviewed the above documentation for accuracy and completeness, and I agree with the above.

## 2021-08-04 ENCOUNTER — Inpatient Hospital Stay: Payer: Medicare Other | Attending: Hematology and Oncology | Admitting: Hematology and Oncology

## 2021-08-04 ENCOUNTER — Other Ambulatory Visit: Payer: Self-pay

## 2021-08-04 DIAGNOSIS — C50311 Malignant neoplasm of lower-inner quadrant of right female breast: Secondary | ICD-10-CM | POA: Insufficient documentation

## 2021-08-04 DIAGNOSIS — Z170 Estrogen receptor positive status [ER+]: Secondary | ICD-10-CM | POA: Diagnosis not present

## 2021-08-04 DIAGNOSIS — Z79811 Long term (current) use of aromatase inhibitors: Secondary | ICD-10-CM | POA: Diagnosis not present

## 2021-08-04 MED ORDER — ANASTROZOLE 1 MG PO TABS: 1.0000 mg | ORAL_TABLET | Freq: Every day | ORAL | 3 refills | Status: DC

## 2021-08-04 NOTE — Assessment & Plan Note (Signed)
06/16/2019: Right lumpectomy:Right lumpectomy Ninfa Linden): IDC with intermediate grade DCIS, 0.6cm, grade 2, lymphovascular space invasion present, clear margins, and one right axillary lymph node negative. grade 1, HER-2 - (0), ER+ 100%, PR+ 70%, Ki67 5%. T1BN0 stage Ia  Current treatment: Anastrozole 1 mg daily started 07/01/2019 Anastrozole toxicities:  Breast cancer surveillance: 1.  Breast exam:  08/04/2021: Benign 2. mammogram and ultrasound 07/26/2021: Benign breast density category B  Return to clinic in 1 year for follow-up

## 2021-08-12 ENCOUNTER — Other Ambulatory Visit: Payer: Self-pay

## 2021-08-12 ENCOUNTER — Ambulatory Visit (INDEPENDENT_AMBULATORY_CARE_PROVIDER_SITE_OTHER): Payer: Medicare Other | Admitting: Physical Medicine and Rehabilitation

## 2021-08-12 ENCOUNTER — Encounter: Payer: Self-pay | Admitting: Physical Medicine and Rehabilitation

## 2021-08-12 DIAGNOSIS — R202 Paresthesia of skin: Secondary | ICD-10-CM

## 2021-08-12 NOTE — Progress Notes (Signed)
Pt state she has pain and numbness in her left hand. Pt state her hand feels weak and pain in her fingers. Pt state she feels pain in the palm of her hand when she lift items up. Pt is right handed.  Numeric Pain Rating Scale and Functional Assessment Average Pain 7   In the last MONTH (on 0-10 scale) has pain interfered with the following?  1. General activity like being  able to carry out your everyday physical activities such as walking, climbing stairs, carrying groceries, or moving a chair?  Rating(9)

## 2021-08-14 NOTE — Progress Notes (Signed)
Miranda [name] - 87 y.o. female MRN [id number]  Date of birth: [birth year]  Office Visit Note: Visit Date: 08/12/2021 PCP: Celene Squibb, MD Referred by: Celene Squibb, MD  Subjective: Chief Complaint  Patient presents with   Left Hand - Pain, Numbness, Weakness   HPI:  Miranda [name] is a 87 y.o. female who comes in today at the request of Dr. Arther Abbott for electrodiagnostic study of the Left upper extremities.  Patient is Right hand dominant.  She reports pretty constant now pain numbness and tingling in the left hand which is somewhat nondermatomal.  She gets some symptoms more into the fifth digit but also some symptoms into the middle digit.  She denies any right-sided complaints.  She rates her pain as an average of 7 out of 10.  This is despite medication management and using braces.  She has had no prior history of carpal tunnel release for electrodiagnostic study.  She does not endorse any specific radicular complaints overall.  She is not diabetic and has no history of peripheral polyneuropathy.   ROS Otherwise per HPI.  Assessment & Plan: Visit Diagnoses:    ICD-10-CM   1. Paresthesia of skin  R20.2 NCV with EMG (electromyography)      Plan: Impression: The above electrodiagnostic study is ABNORMAL and reveals evidence of:  A severe left ulnar nerve entrapment at the elbow (cubital tunnel syndrome) affecting sensory and motor components.   A moderate left median nerve entrapment at the wrist (carpal tunnel syndrome) affecting sensory and motor components.   There is no significant electrodiagnostic evidence of any other focal nerve entrapment, brachial plexopathy or cervical radiculopathy.  Recommendations: 1.  Follow-up with referring physician. 2.  Suggest surgical evaluation.   Meds & Orders: No orders of the defined types were placed in this encounter.   Orders Placed This Encounter  Procedures   NCV with EMG (electromyography)    Follow-up: Return  in about 2 weeks (around 08/26/2021) for Arther Abbott, MD.   Procedures: No procedures performed  EMG & NCV Findings: Evaluation of the left median motor nerve showed prolonged distal onset latency (5.7 ms) and decreased conduction velocity (Elbow-Wrist, 45 m/s).  The left ulnar motor nerve showed decreased conduction velocity (A Elbow-B Elbow, 40 m/s).  The left median (across palm) sensory nerve showed prolonged distal peak latency (Wrist, 6.1 ms) and prolonged distal peak latency (Palm, 2.4 ms).  The left ulnar sensory nerve showed prolonged distal peak latency (5.1 ms), reduced amplitude (6.9 V), and decreased conduction velocity (Wrist-5th Digit, 27 m/s).  All remaining nerves (as indicated in the following tables) were within normal limits.    Needle evaluation of the left first dorsal interosseous and the left flexor digitorum profundus muscles showed increased insertional activity, increased spontaneous activity, and diminished recruitment.  All remaining muscles (as indicated in the following table) showed no evidence of electrical instability.    Impression: The above electrodiagnostic study is ABNORMAL and reveals evidence of:  A severe left ulnar nerve entrapment at the elbow (cubital tunnel syndrome) affecting sensory and motor components.   A moderate left median nerve entrapment at the wrist (carpal tunnel syndrome) affecting sensory and motor components.   There is no significant electrodiagnostic evidence of any other focal nerve entrapment, brachial plexopathy or cervical radiculopathy.  Recommendations: 1.  Follow-up with referring physician. 2.  Suggest surgical evaluation.  ___________________________ Wonda Olds Board Certified, American Board of Physical Medicine and Rehabilitation  Nerve Conduction Studies Anti Sensory Summary Table   Stim Site NR Peak (ms) Norm Peak (ms) P-T Amp (V) Norm P-T Amp Site1 Site2 Delta-P (ms) Dist (cm) Vel (m/s) Norm Vel  (m/s)  Left Median Acr Palm Anti Sensory (2nd Digit)  31C  Wrist    *6.1 <3.6 21.6 >10 Wrist Palm 3.7 0.0    Palm    *2.4 <2.0 22.7         Left Radial Anti Sensory (Base 1st Digit)  30.8C  Wrist    2.2 <3.1 27.1  Wrist Base 1st Digit 2.2 0.0    Left Ulnar Anti Sensory (5th Digit)  31.2C  Wrist    *5.1 <3.7 *6.9 >15.0 Wrist 5th Digit 5.1 14.0 *27 >38   Motor Summary Table   Stim Site NR Onset (ms) Norm Onset (ms) O-P Amp (mV) Norm O-P Amp Site1 Site2 Delta-0 (ms) Dist (cm) Vel (m/s) Norm Vel (m/s)  Left Median Motor (Abd Poll Brev)  30.8C  Wrist    *5.7 <4.2 7.7 >5 Elbow Wrist 4.4 20.0 *45 >50  Elbow    10.1  6.6         Left Ulnar Motor (Abd Dig Min)  30.9C  Wrist    3.5 <4.2 4.6 >3 B Elbow Wrist 3.3 18.5 56 >53  B Elbow    6.8  2.6  A Elbow B Elbow 2.5 10.0 *40 >53  A Elbow    9.3  2.6          EMG   Side Muscle Nerve Root Ins Act Fibs Psw Amp Dur Poly Recrt Int Fraser Din Comment  Left Abd Poll Brev Median C8-T1 Nml Nml Nml Nml Nml 0 Nml Nml   Left 1stDorInt Ulnar C8-T1 *Incr *3+ *3+ Nml Nml 0 *Reduced Nml   Left PronatorTeres Median C6-7 Nml Nml Nml Nml Nml 0 Nml Nml   Left Biceps Musculocut C5-6 Nml Nml Nml Nml Nml 0 Nml Nml   Left Deltoid Axillary C5-6 Nml Nml Nml Nml Nml 0 Nml Nml   Left FlexDigProf Ulnar C8,T1 *Incr *3+ *3+ Nml Nml 0 *Reduced Nml     Nerve Conduction Studies Anti Sensory Left/Right Comparison   Stim Site L Lat (ms) R Lat (ms) L-R Lat (ms) L Amp (V) R Amp (V) L-R Amp (%) Site1 Site2 L Vel (m/s) R Vel (m/s) L-R Vel (m/s)  Median Acr Palm Anti Sensory (2nd Digit)  31C  Wrist *6.1   21.6   Wrist Palm     Palm *2.4   22.7         Radial Anti Sensory (Base 1st Digit)  30.8C  Wrist 2.2   27.1   Wrist Base 1st Digit     Ulnar Anti Sensory (5th Digit)  31.2C  Wrist *5.1   *6.9   Wrist 5th Digit *27     Motor Left/Right Comparison   Stim Site L Lat (ms) R Lat (ms) L-R Lat (ms) L Amp (mV) R Amp (mV) L-R Amp (%) Site1 Site2 L Vel (m/s) R Vel (m/s) L-R Vel  (m/s)  Median Motor (Abd Poll Brev)  30.8C  Wrist *5.7   7.7   Elbow Wrist *45    Elbow 10.1   6.6         Ulnar Motor (Abd Dig Min)  30.9C  Wrist 3.5   4.6   B Elbow Wrist 56    B Elbow 6.8   2.6   A Elbow B Elbow *40  A Elbow 9.3   2.6            Waveforms:            Clinical History: No specialty comments available.     Objective:  VS:  HT:    WT:   BMI:     BP:   HR: bpm  TEMP: ( )  RESP:  Physical Exam Musculoskeletal:        General: No swelling, tenderness or deformity.     Comments: Inspection reveals no atrophy of the bilateral APB or FDI or hand intrinsics. There is no swelling, color changes, allodynia or dystrophic changes. There is 5 out of 5 strength in the bilateral wrist extension, finger abduction and long finger flexion. There is DC sensation to light touch and more of a left ulnar nerve distribution. There is a positive Froment's test on the left.  Equivocal Wartenberg's sign on the left. There is a negative Hoffmann's test bilaterally.  Skin:    General: Skin is warm and dry.     Findings: No erythema or rash.  Neurological:     General: No focal deficit present.     Mental Status: She is alert and oriented to person, place, and time.     Motor: No weakness or abnormal muscle tone.     Coordination: Coordination normal.  Psychiatric:        Mood and Affect: Mood normal.        Behavior: Behavior normal.     Imaging: No results found.

## 2021-08-14 NOTE — Procedures (Signed)
EMG & NCV Findings: Evaluation of the left median motor nerve showed prolonged distal onset latency (5.7 ms) and decreased conduction velocity (Elbow-Wrist, 45 m/s).  The left ulnar motor nerve showed decreased conduction velocity (A Elbow-B Elbow, 40 m/s).  The left median (across palm) sensory nerve showed prolonged distal peak latency (Wrist, 6.1 ms) and prolonged distal peak latency (Palm, 2.4 ms).  The left ulnar sensory nerve showed prolonged distal peak latency (5.1 ms), reduced amplitude (6.9 V), and decreased conduction velocity (Wrist-5th Digit, 27 m/s).  All remaining nerves (as indicated in the following tables) were within normal limits.    Needle evaluation of the left first dorsal interosseous and the left flexor digitorum profundus muscles showed increased insertional activity, increased spontaneous activity, and diminished recruitment.  All remaining muscles (as indicated in the following table) showed no evidence of electrical instability.    Impression: The above electrodiagnostic study is ABNORMAL and reveals evidence of:  A severe left ulnar nerve entrapment at the elbow (cubital tunnel syndrome) affecting sensory and motor components.   A moderate left median nerve entrapment at the wrist (carpal tunnel syndrome) affecting sensory and motor components.   There is no significant electrodiagnostic evidence of any other focal nerve entrapment, brachial plexopathy or cervical radiculopathy.  Recommendations: 1.  Follow-up with referring physician. 2.  Suggest surgical evaluation.  ___________________________ Laurence Spates FAAPMR Board Certified, American Board of Physical Medicine and Rehabilitation    Nerve Conduction Studies Anti Sensory Summary Table   Stim Site NR Peak (ms) Norm Peak (ms) P-T Amp (V) Norm P-T Amp Site1 Site2 Delta-P (ms) Dist (cm) Vel (m/s) Norm Vel (m/s)  Left Median Acr Palm Anti Sensory (2nd Digit)  31C  Wrist    *6.1 <3.6 21.6 >10 Wrist Palm 3.7  0.0    Palm    *2.4 <2.0 22.7         Left Radial Anti Sensory (Base 1st Digit)  30.8C  Wrist    2.2 <3.1 27.1  Wrist Base 1st Digit 2.2 0.0    Left Ulnar Anti Sensory (5th Digit)  31.2C  Wrist    *5.1 <3.7 *6.9 >15.0 Wrist 5th Digit 5.1 14.0 *27 >38   Motor Summary Table   Stim Site NR Onset (ms) Norm Onset (ms) O-P Amp (mV) Norm O-P Amp Site1 Site2 Delta-0 (ms) Dist (cm) Vel (m/s) Norm Vel (m/s)  Left Median Motor (Abd Poll Brev)  30.8C  Wrist    *5.7 <4.2 7.7 >5 Elbow Wrist 4.4 20.0 *45 >50  Elbow    10.1  6.6         Left Ulnar Motor (Abd Dig Min)  30.9C  Wrist    3.5 <4.2 4.6 >3 B Elbow Wrist 3.3 18.5 56 >53  B Elbow    6.8  2.6  A Elbow B Elbow 2.5 10.0 *40 >53  A Elbow    9.3  2.6          EMG   Side Muscle Nerve Root Ins Act Fibs Psw Amp Dur Poly Recrt Int Fraser Din Comment  Left Abd Poll Brev Median C8-T1 Nml Nml Nml Nml Nml 0 Nml Nml   Left 1stDorInt Ulnar C8-T1 *Incr *3+ *3+ Nml Nml 0 *Reduced Nml   Left PronatorTeres Median C6-7 Nml Nml Nml Nml Nml 0 Nml Nml   Left Biceps Musculocut C5-6 Nml Nml Nml Nml Nml 0 Nml Nml   Left Deltoid Axillary C5-6 Nml Nml Nml Nml Nml 0 Nml Nml   Left FlexDigProf  Ulnar C8,T1 *Incr *3+ *3+ Nml Nml 0 *Reduced Nml     Nerve Conduction Studies Anti Sensory Left/Right Comparison   Stim Site L Lat (ms) R Lat (ms) L-R Lat (ms) L Amp (V) R Amp (V) L-R Amp (%) Site1 Site2 L Vel (m/s) R Vel (m/s) L-R Vel (m/s)  Median Acr Palm Anti Sensory (2nd Digit)  31C  Wrist *6.1   21.6   Wrist Palm     Palm *2.4   22.7         Radial Anti Sensory (Base 1st Digit)  30.8C  Wrist 2.2   27.1   Wrist Base 1st Digit     Ulnar Anti Sensory (5th Digit)  31.2C  Wrist *5.1   *6.9   Wrist 5th Digit *27     Motor Left/Right Comparison   Stim Site L Lat (ms) R Lat (ms) L-R Lat (ms) L Amp (mV) R Amp (mV) L-R Amp (%) Site1 Site2 L Vel (m/s) R Vel (m/s) L-R Vel (m/s)  Median Motor (Abd Poll Brev)  30.8C  Wrist *5.7   7.7   Elbow Wrist *45    Elbow 10.1   6.6          Ulnar Motor (Abd Dig Min)  30.9C  Wrist 3.5   4.6   B Elbow Wrist 56    B Elbow 6.8   2.6   A Elbow B Elbow *40    A Elbow 9.3   2.6            Waveforms:

## 2021-08-22 ENCOUNTER — Encounter: Payer: Self-pay | Admitting: Orthopedic Surgery

## 2021-08-22 ENCOUNTER — Ambulatory Visit (INDEPENDENT_AMBULATORY_CARE_PROVIDER_SITE_OTHER): Payer: Medicare Other | Admitting: Orthopedic Surgery

## 2021-08-22 ENCOUNTER — Other Ambulatory Visit: Payer: Self-pay

## 2021-08-22 VITALS — BP 136/83 | HR 67 | Ht 60.0 in | Wt 160.0 lb

## 2021-08-22 DIAGNOSIS — G5622 Lesion of ulnar nerve, left upper limb: Secondary | ICD-10-CM | POA: Diagnosis not present

## 2021-08-22 DIAGNOSIS — G5602 Carpal tunnel syndrome, left upper limb: Secondary | ICD-10-CM | POA: Diagnosis not present

## 2021-08-22 NOTE — Patient Instructions (Signed)
Your surgery will be at [facility] by Dr Aline Brochure  The hospital will contact you with a preoperative appointment to discuss Anesthesia. The number they will call you from is [phone]. Please bring your medications with you for the appointment. They will tell you the arrival time and medication instructions when you have your preoperative evaluation. Do not wear nail polish the day of your surgery and if you take Phentermine you need to stop this medication ONE WEEK prior to your surgery.

## 2021-08-22 NOTE — Progress Notes (Signed)
Chief Complaint  Patient presents with   Results    Review nerve study/ left hand     Miranda [name] is continuing to complain of pain in her left hand with the 4 fingers going numb and itching of the thumb she has pain deep down in the palm despite bracing  She did have the nerve conduction study and she has an abnormal test with severe left ulnar neuropathy despite having no elbow pain  She has moderate left median neuropathy at the wrist consistent with carpal tunnel syndrome  She does report weakness and no strength in the hand which could be carpal tunnel or cubital tunnel  I recommended we go ahead with left carpal tunnel release I explained to her and discussed with her that to get complete relief we may need to send her to a hand specialist for the cubital tunnel release as I do not do that procedure I do think that we should proceed with the surgery to ensure that we get the best possible chance at recovery as her symptoms have been present less than a year  Plan left carpal tunnel release

## 2021-08-23 DIAGNOSIS — R7301 Impaired fasting glucose: Secondary | ICD-10-CM | POA: Diagnosis not present

## 2021-08-23 DIAGNOSIS — I10 Essential (primary) hypertension: Secondary | ICD-10-CM | POA: Diagnosis not present

## 2021-08-29 DIAGNOSIS — M5442 Lumbago with sciatica, left side: Secondary | ICD-10-CM | POA: Diagnosis not present

## 2021-08-29 DIAGNOSIS — I129 Hypertensive chronic kidney disease with stage 1 through stage 4 chronic kidney disease, or unspecified chronic kidney disease: Secondary | ICD-10-CM | POA: Diagnosis not present

## 2021-08-29 DIAGNOSIS — Z0001 Encounter for general adult medical examination with abnormal findings: Secondary | ICD-10-CM | POA: Diagnosis not present

## 2021-08-29 DIAGNOSIS — Z23 Encounter for immunization: Secondary | ICD-10-CM | POA: Diagnosis not present

## 2021-08-29 DIAGNOSIS — R7301 Impaired fasting glucose: Secondary | ICD-10-CM | POA: Diagnosis not present

## 2021-08-29 DIAGNOSIS — Z85118 Personal history of other malignant neoplasm of bronchus and lung: Secondary | ICD-10-CM | POA: Diagnosis not present

## 2021-08-29 DIAGNOSIS — R002 Palpitations: Secondary | ICD-10-CM | POA: Diagnosis not present

## 2021-08-29 DIAGNOSIS — Z853 Personal history of malignant neoplasm of breast: Secondary | ICD-10-CM | POA: Diagnosis not present

## 2021-08-29 DIAGNOSIS — E669 Obesity, unspecified: Secondary | ICD-10-CM | POA: Diagnosis not present

## 2021-08-29 DIAGNOSIS — E876 Hypokalemia: Secondary | ICD-10-CM | POA: Diagnosis not present

## 2021-08-29 DIAGNOSIS — N182 Chronic kidney disease, stage 2 (mild): Secondary | ICD-10-CM | POA: Diagnosis not present

## 2021-08-29 DIAGNOSIS — E782 Mixed hyperlipidemia: Secondary | ICD-10-CM | POA: Diagnosis not present

## 2021-08-30 NOTE — Patient Instructions (Signed)
Miranda [name]  08/30/2021     @PREFPERIOPPHARMACY @   Your procedure is scheduled on 09/02/2021.   Report to [facility] at 0700 A.M.   Call this number if you have problems the morning of surgery:  [phone]   Remember:  Do not eat or drink after midnight.      Take these medicines the morning of surgery with A SIP OF WATER                       armidex, metoprolol.     Do not wear jewelry, make-up or nail polish.  Do not wear lotions, powders, or perfumes, or deodorant.  Do not shave 48 hours prior to surgery.  Men may shave face and neck.  Do not bring valuables to the hospital.  [facility] is not responsible for any belongings or valuables.  Contacts, dentures or bridgework may not be worn into surgery.  Leave your suitcase in the car.  After surgery it may be brought to your room.  For patients admitted to the hospital, discharge time will be determined by your treatment team.  Patients discharged the day of surgery will not be allowed to drive home and must have someone with them for 24 hours.    Special instructions:   DO NOT smoke tobacco or vape for 24 hours before your procedure  Please read over the following fact sheets that you were given. Coughing and Deep Breathing, Surgical Site Infection Prevention, Anesthesia Post-op Instructions, and Care and Recovery After Surgery      Open Carpal Tunnel Release Open carpal tunnel release is a surgery to relieve symptoms caused by carpal tunnel syndrome. The carpal tunnel is a narrow, rigid space in the wrist. It is located between the wrist bones and a band of connective tissue (transverse carpal ligament). The nerve that supplies most of the hand (median nerve) passes through the carpal tunnel, and so do tissues that connect bones to muscles (tendons) in the hand and arm. Carpal tunnel syndrome makes this space swell and become narrow. The swelling pinches the median nerve and causes pain, numbness,  tingling, and weakness in the hand. During carpal tunnel release surgery, the transverse carpal ligament is cut to make more room in the carpal tunnel space. This also lessens the pressure on the median nerve. You may have this surgery if other types of treatment have not relieved your carpal tunnel symptoms. This surgery is usually done only for the hand that you use more often (dominant hand), but it may be done for both hands depending on your symptoms. Tell a health care provider about: Any allergies you have. All medicines you are taking, including vitamins, herbs, eye drops, creams, and over-the-counter medicines. Any problems you or family members have had with anesthetic medicines. Any blood disorders you have. Any surgeries you have had. Any medical conditions you have. Whether you are pregnant or may be pregnant. What are the risks? Generally, this is a safe procedure. However, problems may occur, including: Infection. Bleeding. Injury to the median nerve. Allergic reactions to medicines. The surgery failing to relieve your symptoms, or making your symptoms worse. What happens before the procedure? Staying hydrated Follow instructions from your health care provider about hydration, which may include: Up to 2 hours before the procedure - you may continue to drink clear liquids, such as water, clear fruit juice, black coffee, and plain tea.  Eating and  drinking restrictions Follow instructions from your health care provider about eating and drinking, which may include: 8 hours before the procedure - stop eating heavy meals or foods such as meat, fried foods, or fatty foods. 6 hours before the procedure - stop eating light meals or foods, such as toast or cereal. 6 hours before the procedure - stop drinking milk or drinks that contain milk. 2 hours before the procedure - stop drinking clear liquids. Medicines Ask your health care provider about: Changing or stopping your regular  medicines. This is especially important if you are taking diabetes medicines or blood thinners. Taking medicines such as aspirin and ibuprofen. These medicines can thin your blood. Do not take these medicines unless your health care provider tells you to take them. Taking over-the-counter medicines, vitamins, herbs, and supplements. General instructions Do not use any products that contain nicotine or tobacco for at least 4 weeks before the procedure. These products include cigarettes, e-cigarettes, and chewing tobacco. If you need help quitting, ask your health care provider. Plan to have a responsible adult take you home from the hospital or clinic. If you will be going home right after the procedure, plan to have a responsible adult care for you for the time you are told. This is important. Ask your health care provider: How your surgery site will be marked. What steps will be taken to help prevent infection. These steps may include washing the skin with a germ-killing soap. What happens during the procedure?  An IV will be inserted into one of your veins. You will be given one or more of the following: A medicine to help you relax (sedative). A medicine to numb the wrist area (local anesthetic). A medicine that is injected to numb everything below the injection site (regional anesthetic). An incision will be made in your wrist, on the same side as your palm. The skin of your wrist will be spread to expose the transverse carpal ligament. The transverse carpal ligament will be cut to make more room in the carpal tunnel space. Your incision will be closed with stitches (sutures) or staples. A compression bandage (dressing) will be wrapped around your hand and wrist. The procedure may vary among health care providers and hospitals. What happens after the procedure? Your blood pressure, heart rate, breathing rate, and blood oxygen level will be monitored until you leave the hospital or  clinic. You will be given pain medicine as needed. A splint or brace may be placed over your dressing, to hold your hand and wrist in place while you heal. Do not drive until your health care provider approves. Summary Carpal tunnel release is a surgery to relieve pain and numbness in the hand caused by swelling around a nerve (carpal tunnel syndrome). You may have this surgery if other types of treatment have not relieved your carpal tunnel symptoms. During carpal tunnel release surgery, a band of connective tissue (transverse carpal ligament) is cut to make more room in the carpal tunnel space. This information is not intended to replace advice given to you by your health care provider. Make sure you discuss any questions you have with your health care provider. Document Revised: 03/30/2020 Document Reviewed: 03/18/2020 Elsevier Patient Education  2022 [employer] After This sheet gives you information about how to care for yourself after your procedure. Your health care provider may also give you more specific instructions. If you have problems or questions, contact your health care provider. What can I  expect after the procedure? After the procedure, it is common to have: Tiredness. Forgetfulness about what happened after the procedure. Impaired judgment for important decisions. Nausea or vomiting. Some difficulty with balance. Follow these instructions at home: For the time period you were told by your health care provider:   Rest as needed. Do not participate in activities where you could fall or become injured. Do not drive or use machinery. Do not drink alcohol. Do not take sleeping pills or medicines that cause drowsiness. Do not make important decisions or sign legal documents. Do not take care of children on your own. Eating and drinking Follow the diet that is recommended by your health care provider. Drink enough fluid to keep your urine  pale yellow. If you vomit: Drink water, juice, or soup when you can drink without vomiting. Make sure you have little or no nausea before eating solid foods. General instructions Have a responsible adult stay with you for the time you are told. It is important to have someone help care for you until you are awake and alert. Take over-the-counter and prescription medicines only as told by your health care provider. If you have sleep apnea, surgery and certain medicines can increase your risk for breathing problems. Follow instructions from your health care provider about wearing your sleep device: Anytime you are sleeping, including during daytime naps. While taking prescription pain medicines, sleeping medicines, or medicines that make you drowsy. Avoid smoking. Keep all follow-up visits as told by your health care provider. This is important. Contact a health care provider if: You keep feeling nauseous or you keep vomiting. You feel light-headed. You are still sleepy or having trouble with balance after 24 hours. You develop a rash. You have a fever. You have redness or swelling around the IV site. Get help right away if: You have trouble breathing. You have new-onset confusion at home. Summary For several hours after your procedure, you may feel tired. You may also be forgetful and have poor judgment. Have a responsible adult stay with you for the time you are told. It is important to have someone help care for you until you are awake and alert. Rest as told. Do not drive or operate machinery. Do not drink alcohol or take sleeping pills. Get help right away if you have trouble breathing, or if you suddenly become confused. This information is not intended to replace advice given to you by your health care provider. Make sure you discuss any questions you have with your health care provider. Document Revised: 07/29/2020 Document Reviewed: 10/16/2019 Elsevier Patient Education  2022  [place]. How to Use Chlorhexidine for Bathing Chlorhexidine gluconate (CHG) is a germ-killing (antiseptic) solution that is used to clean the skin. It can get rid of the bacteria that normally live on the skin and can keep them away for about 24 hours. To clean your skin with CHG, you may be given: A CHG solution to use in the shower or as part of a sponge bath. A prepackaged cloth that contains CHG. Cleaning your skin with CHG may help lower the risk for infection: While you are staying in the intensive care unit of the hospital. If you have a vascular access, such as a central line, to provide short-term or long-term access to your veins. If you have a catheter to drain urine from your bladder. If you are on a ventilator. A ventilator is a machine that helps you breathe by moving air in and out of your  lungs. After surgery. What are the risks? Risks of using CHG include: A skin reaction. Hearing loss, if CHG gets in your ears and you have a perforated eardrum. Eye injury, if CHG gets in your eyes and is not rinsed out. The CHG product catching fire. Make sure that you avoid smoking and flames after applying CHG to your skin. Do not use CHG: If you have a chlorhexidine allergy or have previously reacted to chlorhexidine. On babies younger than 12 months of age. How to use CHG solution Use CHG only as told by your health care provider, and follow the instructions on the label. Use the full amount of CHG as directed. Usually, this is one bottle. During a shower Follow these steps when using CHG solution during a shower (unless your health care provider gives you different instructions): Start the shower. Use your normal soap and shampoo to wash your face and hair. Turn off the shower or move out of the shower stream. Pour the CHG onto a clean washcloth. Do not use any type of brush or rough-edged sponge. Starting at your neck, lather your body down to your toes. Make sure you follow  these instructions: If you will be having surgery, pay special attention to the part of your body where you will be having surgery. Scrub this area for at least 1 minute. Do not use CHG on your head or face. If the solution gets into your ears or eyes, rinse them well with water. Avoid your genital area. Avoid any areas of skin that have broken skin, cuts, or scrapes. Scrub your back and under your arms. Make sure to wash skin folds. Let the lather sit on your skin for 1-2 minutes or as long as told by your health care provider. Thoroughly rinse your entire body in the shower. Make sure that all body creases and crevices are rinsed well. Dry off with a clean towel. Do not put any substances on your body afterward--such as powder, lotion, or perfume--unless you are told to do so by your health care provider. Only use lotions that are recommended by the manufacturer. Put on clean clothes or pajamas. If it is the night before your surgery, sleep in clean sheets.  During a sponge bath Follow these steps when using CHG solution during a sponge bath (unless your health care provider gives you different instructions): Use your normal soap and shampoo to wash your face and hair. Pour the CHG onto a clean washcloth. Starting at your neck, lather your body down to your toes. Make sure you follow these instructions: If you will be having surgery, pay special attention to the part of your body where you will be having surgery. Scrub this area for at least 1 minute. Do not use CHG on your head or face. If the solution gets into your ears or eyes, rinse them well with water. Avoid your genital area. Avoid any areas of skin that have broken skin, cuts, or scrapes. Scrub your back and under your arms. Make sure to wash skin folds. Let the lather sit on your skin for 1-2 minutes or as long as told by your health care provider. Using a different clean, wet washcloth, thoroughly rinse your entire body. Make sure  that all body creases and crevices are rinsed well. Dry off with a clean towel. Do not put any substances on your body afterward--such as powder, lotion, or perfume--unless you are told to do so by your health care provider. Only use lotions  that are recommended by the manufacturer. Put on clean clothes or pajamas. If it is the night before your surgery, sleep in clean sheets. How to use CHG prepackaged cloths Only use CHG cloths as told by your health care provider, and follow the instructions on the label. Use the CHG cloth on clean, dry skin. Do not use the CHG cloth on your head or face unless your health care provider tells you to. When washing with the CHG cloth: Avoid your genital area. Avoid any areas of skin that have broken skin, cuts, or scrapes. Before surgery Follow these steps when using a CHG cloth to clean before surgery (unless your health care provider gives you different instructions): Using the CHG cloth, vigorously scrub the part of your body where you will be having surgery. Scrub using a back-and-forth motion for 3 minutes. The area on your body should be completely wet with CHG when you are done scrubbing. Do not rinse. Discard the cloth and let the area air-dry. Do not put any substances on the area afterward, such as powder, lotion, or perfume. Put on clean clothes or pajamas. If it is the night before your surgery, sleep in clean sheets.  For general bathing Follow these steps when using CHG cloths for general bathing (unless your health care provider gives you different instructions). Use a separate CHG cloth for each area of your body. Make sure you wash between any folds of skin and between your fingers and toes. Wash your body in the following order, switching to a new cloth after each step: The front of your neck, shoulders, and chest. Both of your arms, under your arms, and your hands. Your stomach and groin area, avoiding the genitals. Your right leg and  foot. Your left leg and foot. The back of your neck, your back, and your buttocks. Do not rinse. Discard the cloth and let the area air-dry. Do not put any substances on your body afterward--such as powder, lotion, or perfume--unless you are told to do so by your health care provider. Only use lotions that are recommended by the manufacturer. Put on clean clothes or pajamas. Contact a health care provider if: Your skin gets irritated after scrubbing. You have questions about using your solution or cloth. You swallow any chlorhexidine. Call your local poison control center (1-973-008-7014 in the [place] Get help right away if: Your eyes itch badly, or they become very red or swollen. Your skin itches badly and is red or swollen. Your hearing changes. You have trouble seeing. You have swelling or tingling in your mouth or throat. You have trouble breathing. These symptoms may represent a serious problem that is an emergency. Do not wait to see if the symptoms will go away. Get medical help right away. Call your local emergency services (911 in the [place] Do not drive yourself to the hospital. Summary Chlorhexidine gluconate (CHG) is a germ-killing (antiseptic) solution that is used to clean the skin. Cleaning your skin with CHG may help to lower your risk for infection. You may be given CHG to use for bathing. It may be in a bottle or in a prepackaged cloth to use on your skin. Carefully follow your health care provider's instructions and the instructions on the product label. Do not use CHG if you have a chlorhexidine allergy. Contact your health care provider if your skin gets irritated after scrubbing. This information is not intended to replace advice given to you by your health care provider. Make sure you  discuss any questions you have with your health care provider. Document Revised: 01/24/2021 Document Reviewed: 01/24/2021 Elsevier Patient Education  2022 [employer].

## 2021-08-31 ENCOUNTER — Encounter (HOSPITAL_COMMUNITY)
Admission: RE | Admit: 2021-08-31 | Discharge: 2021-08-31 | Disposition: A | Discharge status: Home / Self Care | Payer: Medicare Other | Source: Ambulatory Visit | Attending: Orthopedic Surgery | Admitting: Orthopedic Surgery

## 2021-08-31 ENCOUNTER — Encounter (HOSPITAL_COMMUNITY): Payer: Self-pay

## 2021-08-31 ENCOUNTER — Other Ambulatory Visit: Payer: Self-pay

## 2021-08-31 DIAGNOSIS — Z01812 Encounter for preprocedural laboratory examination: Secondary | ICD-10-CM | POA: Insufficient documentation

## 2021-08-31 HISTORY — DX: Personal history of urinary calculi: Z87.442

## 2021-09-02 ENCOUNTER — Telehealth: Payer: Self-pay | Admitting: Orthopedic Surgery

## 2021-09-02 NOTE — Telephone Encounter (Signed)
Miranda [name],   Patient called and left a voicemail stating her surgery got rescheduled and we need to change her appt for 09/07/21.  I looked at the surgeries tab and it only shows 09/06/21 it doesn't show a cancellation.  Please review and call the patient.

## 2021-09-05 ENCOUNTER — Other Ambulatory Visit: Payer: Self-pay

## 2021-09-05 ENCOUNTER — Encounter (HOSPITAL_COMMUNITY)
Admission: RE | Admit: 2021-09-05 | Discharge: 2021-09-05 | Disposition: A | Discharge status: Home / Self Care | Payer: Medicare Other | Source: Ambulatory Visit | Attending: Orthopedic Surgery | Admitting: Orthopedic Surgery

## 2021-09-05 NOTE — H&P (Signed)
Chief Complaint  Patient presents with   Results      Review nerve study/ left hand       Miranda [name] is continuing to complain of pain in her left hand with the 4 fingers going numb and itching of the thumb she has pain deep down in the palm despite bracing  She did have the nerve conduction study and she has an abnormal test with severe left ulnar neuropathy despite having no elbow pain  She has moderate left median neuropathy at the wrist consistent with carpal tunnel syndrome  She does report weakness and no strength in the hand which could be carpal tunnel or cubital tunnel  Review of systems nothing acute.  No chest pain or shortness of breath no GI symptoms no excessive urination no fevers chills malaise   Past Medical History:  Diagnosis Date   Asthma    Cancer ([place])    Essential hypertension, benign    History of kidney stones    Mixed hyperlipidemia     Past Surgical History:  Procedure Laterality Date   ABDOMINAL HYSTERECTOMY     excessive bleeding, no cancer    BREAST LUMPECTOMY Right 2020   Left knee arthroscopy due to torn ligament and cartiallege  2006   [facility]    Left lung - lobectomy lower lobe - lung cancer     RADIOACTIVE SEED GUIDED PARTIAL MASTECTOMY WITH AXILLARY SENTINEL LYMPH NODE BIOPSY Right 06/24/2019   Procedure: RADIOACTIVE SEED GUIDED RIGHT BREAST PARTIAL MASTECTOMY WITH  SENTINEL LYMPH NODE BIOPSY;  Surgeon: Coralie Keens, MD;  Location: [facility];  Service: General;  Laterality: Right;    Social History   Tobacco Use   Smoking status: Never   Smokeless tobacco: Never  Vaping Use   Vaping Use: Never used  Substance Use Topics   Alcohol use: Yes    Comment: occasional   Drug use: No    Family History  Problem Relation Age of Onset   Colon cancer Father    Heart attack Mother    Anuerysm Mother        Brain    Stroke Sister    Hypertension Sister    Melanoma Sister    Hypertension Sister    Hypertension Son    Cancer Other         Family history    Arthritis Other        Family history    General appearance the patient is well-developed and well-nourished grooming and hygiene are normal She is oriented x3 Pleasant mood normal affect Gait and station are normal without limp  The left hand does show there is decreased sensation in the index long and ring finger but not the thumb but the thumb itches color capillary refill normal she has weakness in grip wrist is stable motion is slightly diminished but this is thought to be secondary to arthritis  The skin is intact pulses are good color is normal lymph nodes are negative sensory exam as described there are no pathologic reflexes coordination is normal  Impression she has carpal tunnel syndrome and cubital tunnel syndrome  She is presenting for left carpal tunnel release understanding she may have to go to hand surgery for the cubital tunnel syndrome.  I recommended we go ahead with left carpal tunnel release I explained to her and discussed with her that to get complete relief we may need to send her to a hand specialist for the cubital tunnel release as I do  not do that procedure I do think that we should proceed with the surgery to ensure that we get the best possible chance at recovery as her symptoms have been present less than a year  Plan left carpal tunnel release

## 2021-09-06 ENCOUNTER — Other Ambulatory Visit: Payer: Self-pay

## 2021-09-06 ENCOUNTER — Other Ambulatory Visit: Payer: Self-pay | Admitting: Orthopedic Surgery

## 2021-09-06 ENCOUNTER — Encounter (HOSPITAL_COMMUNITY)
Admission: RE | Disposition: A | Discharge status: Home / Self Care | Payer: Self-pay | Source: Home / Self Care | Attending: Orthopedic Surgery

## 2021-09-06 ENCOUNTER — Encounter (HOSPITAL_COMMUNITY): Payer: Self-pay | Admitting: Orthopedic Surgery

## 2021-09-06 ENCOUNTER — Ambulatory Visit (HOSPITAL_COMMUNITY): Payer: Medicare Other | Admitting: Anesthesiology

## 2021-09-06 ENCOUNTER — Ambulatory Visit (HOSPITAL_COMMUNITY)
Admission: RE | Admit: 2021-09-06 | Discharge: 2021-09-06 | Disposition: A | Discharge status: Home / Self Care | Payer: Medicare Other | Attending: Orthopedic Surgery | Admitting: Orthopedic Surgery

## 2021-09-06 DIAGNOSIS — G5602 Carpal tunnel syndrome, left upper limb: Secondary | ICD-10-CM | POA: Diagnosis not present

## 2021-09-06 DIAGNOSIS — G5622 Lesion of ulnar nerve, left upper limb: Secondary | ICD-10-CM | POA: Diagnosis not present

## 2021-09-06 DIAGNOSIS — C3492 Malignant neoplasm of unspecified part of left bronchus or lung: Secondary | ICD-10-CM | POA: Diagnosis not present

## 2021-09-06 DIAGNOSIS — G8918 Other acute postprocedural pain: Secondary | ICD-10-CM

## 2021-09-06 HISTORY — PX: CARPAL TUNNEL RELEASE: SHX101

## 2021-09-06 SURGERY — CARPAL TUNNEL RELEASE
Anesthesia: General | Site: Wrist | Laterality: Left

## 2021-09-06 MED ORDER — CEFAZOLIN SODIUM-DEXTROSE 2-4 GM/100ML-% IV SOLN
2.0000 g | INTRAVENOUS | Status: AC
  Administered 2021-09-06: 2 g via INTRAVENOUS

## 2021-09-06 MED ORDER — FENTANYL CITRATE (PF) 100 MCG/2ML IJ SOLN
INTRAMUSCULAR | Status: DC | PRN
  Administered 2021-09-06: 25 ug via INTRAVENOUS
  Administered 2021-09-06: 50 ug via INTRAVENOUS
  Administered 2021-09-06: 25 ug via INTRAVENOUS

## 2021-09-06 MED ORDER — CEFAZOLIN SODIUM-DEXTROSE 2-4 GM/100ML-% IV SOLN
INTRAVENOUS | Status: AC
  Filled 2021-09-06: qty 100

## 2021-09-06 MED ORDER — CHLORHEXIDINE GLUCONATE 0.12 % MT SOLN
15.0000 mL | Freq: Once | OROMUCOSAL | Status: AC
  Administered 2021-09-06: 15 mL via OROMUCOSAL

## 2021-09-06 MED ORDER — BUPIVACAINE HCL (PF) 0.5 % IJ SOLN
INTRAMUSCULAR | Status: DC | PRN
  Administered 2021-09-06 (×2): 10 mL

## 2021-09-06 MED ORDER — LIDOCAINE HCL (PF) 2 % IJ SOLN
INTRAMUSCULAR | Status: AC
  Filled 2021-09-06: qty 10

## 2021-09-06 MED ORDER — PROPOFOL 500 MG/50ML IV EMUL
INTRAVENOUS | Status: DC | PRN
  Administered 2021-09-06: 40 ug/kg/min via INTRAVENOUS

## 2021-09-06 MED ORDER — LACTATED RINGERS IV SOLN
INTRAVENOUS | Status: DC
  Administered 2021-09-06: 1000 mL via INTRAVENOUS

## 2021-09-06 MED ORDER — ONDANSETRON HCL 4 MG/2ML IJ SOLN
INTRAMUSCULAR | Status: AC
  Filled 2021-09-06: qty 4

## 2021-09-06 MED ORDER — LIDOCAINE 2% (20 MG/ML) 5 ML SYRINGE
INTRAMUSCULAR | Status: DC | PRN
  Administered 2021-09-06: 100 mg via INTRAVENOUS

## 2021-09-06 MED ORDER — ORAL CARE MOUTH RINSE: 15.0000 mL | Freq: Once | OROMUCOSAL | Status: AC

## 2021-09-06 MED ORDER — PHENYLEPHRINE 40 MCG/ML (10ML) SYRINGE FOR IV PUSH (FOR BLOOD PRESSURE SUPPORT)
PREFILLED_SYRINGE | INTRAVENOUS | Status: AC
  Filled 2021-09-06: qty 10

## 2021-09-06 MED ORDER — ONDANSETRON HCL 4 MG/2ML IJ SOLN
INTRAMUSCULAR | Status: DC | PRN
  Administered 2021-09-06: 4 mg via INTRAVENOUS

## 2021-09-06 MED ORDER — FENTANYL CITRATE (PF) 100 MCG/2ML IJ SOLN
INTRAMUSCULAR | Status: AC
  Filled 2021-09-06: qty 2

## 2021-09-06 MED ORDER — 0.9 % SODIUM CHLORIDE (POUR BTL) OPTIME
TOPICAL | Status: DC | PRN
  Administered 2021-09-06: 1000 mL

## 2021-09-06 MED ORDER — BUPIVACAINE HCL (PF) 0.5 % IJ SOLN
INTRAMUSCULAR | Status: AC
  Filled 2021-09-06: qty 30

## 2021-09-06 MED ORDER — PROPOFOL 10 MG/ML IV BOLUS
INTRAVENOUS | Status: DC | PRN
  Administered 2021-09-06: 50 mg via INTRAVENOUS
  Administered 2021-09-06: 10 mg via INTRAVENOUS

## 2021-09-06 MED ORDER — ACETAMINOPHEN-CODEINE #3 300-30 MG PO TABS
1.0000 | ORAL_TABLET | Freq: Four times a day (QID) | ORAL | 0 refills | Status: DC | PRN
Start: 2021-09-06 — End: 2021-09-14

## 2021-09-06 MED ORDER — DEXAMETHASONE SODIUM PHOSPHATE 10 MG/ML IJ SOLN
INTRAMUSCULAR | Status: AC
  Filled 2021-09-06: qty 2

## 2021-09-06 MED ORDER — ONDANSETRON HCL 4 MG/2ML IJ SOLN: 4.0000 mg | Freq: Once | INTRAMUSCULAR | Status: DC | PRN

## 2021-09-06 MED ORDER — FENTANYL CITRATE PF 50 MCG/ML IJ SOSY: 25.0000 ug | PREFILLED_SYRINGE | INTRAMUSCULAR | Status: DC | PRN

## 2021-09-06 MED ORDER — LIDOCAINE HCL (PF) 2 % IJ SOLN
INTRAMUSCULAR | Status: AC
  Filled 2021-09-06: qty 5

## 2021-09-06 MED ORDER — PROPOFOL 10 MG/ML IV BOLUS
INTRAVENOUS | Status: AC
  Filled 2021-09-06: qty 20

## 2021-09-06 SURGICAL SUPPLY — 42 items
APL PRP STRL LF DISP 70% ISPRP (MISCELLANEOUS) ×1
BANDAGE ESMARK 4X12 BL STRL LF (DISPOSABLE) ×1 IMPLANT
BLADE SURG 15 STRL LF DISP TIS (BLADE) ×1 IMPLANT
BLADE SURG 15 STRL SS (BLADE) ×2
BNDG CMPR 12X4 ELC STRL LF (DISPOSABLE) ×1
BNDG CMPR STD VLCR NS LF 5.8X3 (GAUZE/BANDAGES/DRESSINGS) ×1
BNDG COHESIVE 4X5 TAN STRL (GAUZE/BANDAGES/DRESSINGS) ×2 IMPLANT
BNDG ELASTIC 3X5.8 VLCR NS LF (GAUZE/BANDAGES/DRESSINGS) ×2 IMPLANT
BNDG ESMARK 4X12 BLUE STRL LF (DISPOSABLE) ×2
BNDG GAUZE ELAST 4 BULKY (GAUZE/BANDAGES/DRESSINGS) ×2 IMPLANT
CHLORAPREP W/TINT 26 (MISCELLANEOUS) ×2 IMPLANT
CLOTH BEACON ORANGE TIMEOUT ST (SAFETY) ×2 IMPLANT
COVER LIGHT HANDLE STERIS (MISCELLANEOUS) ×4 IMPLANT
CUFF TOURN SGL QUICK 18X4 (TOURNIQUET CUFF) ×2 IMPLANT
DRAPE HALF SHEET 40X57 (DRAPES) ×2 IMPLANT
ELECT NDL TIP 2.8 STRL (NEEDLE) ×1 IMPLANT
ELECT NEEDLE TIP 2.8 STRL (NEEDLE) ×2 IMPLANT
ELECT REM PT RETURN 9FT ADLT (ELECTROSURGICAL) ×2
ELECTRODE REM PT RTRN 9FT ADLT (ELECTROSURGICAL) ×1 IMPLANT
GAUZE 4X4 16PLY ~~LOC~~+RFID DBL (SPONGE) ×1 IMPLANT
GAUZE SPONGE 4X4 12PLY STRL (GAUZE/BANDAGES/DRESSINGS) ×2 IMPLANT
GAUZE XEROFORM 1X8 LF (GAUZE/BANDAGES/DRESSINGS) ×2 IMPLANT
GLOVE SS N UNI LF 8.5 STRL (GLOVE) ×2 IMPLANT
GLOVE SURG POLYISO LF SZ8 (GLOVE) ×2 IMPLANT
GLOVE SURG UNDER POLY LF SZ7 (GLOVE) ×4 IMPLANT
GOWN STRL REUS W/TWL LRG LVL3 (GOWN DISPOSABLE) ×2 IMPLANT
GOWN STRL REUS W/TWL XL LVL3 (GOWN DISPOSABLE) ×2 IMPLANT
KIT TURNOVER KIT A (KITS) ×2 IMPLANT
MANIFOLD NEPTUNE II (INSTRUMENTS) ×2 IMPLANT
NDL HYPO 18GX1.5 BLUNT FILL (NEEDLE) IMPLANT
NDL HYPO 21X1.5 SAFETY (NEEDLE) ×1 IMPLANT
NEEDLE HYPO 18GX1.5 BLUNT FILL (NEEDLE) ×2 IMPLANT
NEEDLE HYPO 21X1.5 SAFETY (NEEDLE) ×4 IMPLANT
NS IRRIG 1000ML POUR BTL (IV SOLUTION) ×2 IMPLANT
PACK BASIC LIMB (CUSTOM PROCEDURE TRAY) ×2 IMPLANT
PAD ARMBOARD 7.5X6 YLW CONV (MISCELLANEOUS) ×2 IMPLANT
POSITIONER HAND ALUMI XLG (MISCELLANEOUS) ×2 IMPLANT
SET BASIN LINEN APH (SET/KITS/TRAYS/PACK) ×2 IMPLANT
SUT ETHILON 3 0 FSL (SUTURE) ×2 IMPLANT
SYR 20ML LL LF (SYRINGE) ×1 IMPLANT
SYR BULB IRRIG 60ML STRL (SYRINGE) ×2 IMPLANT
SYR CONTROL 10ML LL (SYRINGE) ×2 IMPLANT

## 2021-09-06 NOTE — Op Note (Signed)
Date 09/06/2021   PRE-OPERATIVE DIAGNOSIS:  LEFT CARPAL TUNNEL SYNDROME  POST-OPERATIVE DIAGNOSIS:  LEFT CARPAL TUNNEL SYNDROME  PROCEDURE:  Procedure(s): LEFT CARPAL TUNNEL RELEASE (Right)   FINDINGS: Stenotic carpal tunnel flattened median nerve discolored median nerve no masses or synovitis in the carpal tunnel  SURGEON:  Surgeon(s) and Role:    Carole Civil, MD - Primary  PHYSICIAN ASSISTANT:   ASSISTANTS: none   ANESTHESIA:   regional  BLOOD ADMINISTERED:none  DRAINS: none   LOCAL MEDICATIONS USED:  MARCAINE     SPECIMEN:  No Specimen  DISPOSITION OF SPECIMEN:  N/A  COUNTS:  YES  TOURNIQUET: 15 minutes to 25 mmHg  DICTATION: .Dragon Dictation   Carpal tunnel release left wrist   Surgeon Aline Brochure  Anesthesia IV sedation with half percent Marcaine plain   Indications failure of conservative treatment to relieve pain and paresthesias and numbness and tingling of the left hand  The patient was identified in the preop area we confirm the surgical site marked as left wrist chart update completed. Patient taken to surgery.  Antibiotic Ancef 2 g after establishing a successful anesthesia the   left arm was prepped with ChloraPrep  Timeout executed completed and confirmed site.  Left wrist  A straight incision was made over the left carpal tunnel in line with the radial border of the ring finger. Blunt dissection was carried out to find the distal aspect of the carpal tunnel. A blunted instrument was passed beneath the carpal tunnel. Sharp incision was then used to release the transverse carpal ligament. The contents of the carpal tunnel were inspected. The median nerve was flat and discolored  The wound was irrigated and then closed with 3-0 nylon suture. We injected 10 mL of plain Marcaine on the radial side of the incision  A sterile bandage was applied and the tourniquet was released the color of the hand and capillary refill were normal  The patient  was taken to the recovery room in stable condition  PLAN OF CARE: Discharge to home after PACU  PATIENT DISPOSITION:  PACU - hemodynamically stable.   Delay start of Pharmacological VTE agent (>24hrs) due to surgical blood loss or risk of bleeding: not applicable

## 2021-09-06 NOTE — Transfer of Care (Signed)
Immediate Anesthesia Transfer of Care Note  Patient: Miranda [name]  Procedure(s) Performed: CARPAL TUNNEL RELEASE (Left: Wrist)  Patient Location: Short Stay  Anesthesia Type:General  Level of Consciousness: awake, alert  and oriented  Airway & Oxygen Therapy: Patient Spontanous Breathing  Post-op Assessment: Report given to RN and Post -op Vital signs reviewed and stable  Post vital signs: Reviewed and stable  Last Vitals:  Vitals Value Taken Time  BP    Temp    Pulse    Resp    SpO2      Last Pain:  Vitals:   09/06/21 1133  TempSrc: Oral  PainSc: 0-No pain      Patients Stated Pain Goal: 5 (00/34/91 7915)  Complications: No notable events documented.

## 2021-09-06 NOTE — Brief Op Note (Signed)
09/06/2021  2:43 PM  PATIENT:  Miranda [name]  87 y.o. female  PRE-OPERATIVE DIAGNOSIS:  left carpal tunnel syndrome  POST-OPERATIVE DIAGNOSIS:  left carpal tunnel syndrome  PROCEDURE:  Procedure(s): CARPAL TUNNEL RELEASE (Left)  SURGEON:  Surgeon(s) and Role:    Carole Civil, MD - Primary  PHYSICIAN ASSISTANT:   ASSISTANTS: none   ANESTHESIA:   local and IV sedation  EBL:  0 mL   BLOOD ADMINISTERED:none  DRAINS: none   LOCAL MEDICATIONS USED:  MARCAINE     SPECIMEN:  No Specimen  DISPOSITION OF SPECIMEN:  N/A  COUNTS:  YES  TOURNIQUET:   Total Tourniquet Time Documented: Upper Arm (Left) - 15 minutes Total: Upper Arm (Left) - 15 minutes   DICTATION: .Viviann Spare Dictation  PLAN OF CARE: Discharge to home after PACU  PATIENT DISPOSITION:  PACU - hemodynamically stable.   Delay start of Pharmacological VTE agent (>24hrs) due to surgical blood loss or risk of bleeding: not applicable

## 2021-09-06 NOTE — Anesthesia Postprocedure Evaluation (Signed)
Anesthesia Post Note  Patient: Miranda [name]  Procedure(s) Performed: CARPAL TUNNEL RELEASE (Left: Wrist)  Patient location during evaluation: Phase II Anesthesia Type: General Level of consciousness: awake and alert and oriented Pain management: pain level controlled Vital Signs Assessment: post-procedure vital signs reviewed and stable Respiratory status: spontaneous breathing and respiratory function stable Cardiovascular status: blood pressure returned to baseline and stable Postop Assessment: no apparent nausea or vomiting Anesthetic complications: no   No notable events documented.   Last Vitals:  Vitals:   09/06/21 1133 09/06/21 1446  BP: (!) 170/95 (!) 146/83  Pulse: (!) 57 (!) 52  Resp: 17 18  Temp: 36.7 C   SpO2: 97% 97%    Last Pain:  Vitals:   09/06/21 1446  TempSrc:   PainSc: 0-No pain                 Wrangler Penning C Alvaretta Eisenberger

## 2021-09-06 NOTE — Interval H&P Note (Signed)
History and Physical Interval Note:  09/06/2021 1:54 PM  Miranda [name]  has presented today for surgery, with the diagnosis of left carpal tunnel syndrome.  The various methods of treatment have been discussed with the patient and family. After consideration of risks, benefits and other options for treatment, the patient has consented to  Procedure(s): CARPAL TUNNEL RELEASE (Left) as a surgical intervention.  The patient's history has been reviewed, patient examined, no change in status, stable for surgery.  I have reviewed the patient's chart and labs.  Questions were answered to the patient's satisfaction.     Arther Abbott

## 2021-09-06 NOTE — Anesthesia Preprocedure Evaluation (Addendum)
Anesthesia Evaluation  Patient identified by MRN, date of birth, ID band Patient awake    Reviewed: Allergy & Precautions, NPO status , Patient's Chart, lab work & pertinent test results, reviewed documented beta blocker date and time   History of Anesthesia Complications Negative for: history of anesthetic complications  Airway Mallampati: II  TM Distance: >3 FB Neck ROM: Full    Dental  (+) Dental Advisory Given   Pulmonary shortness of breath and with exertion, asthma ,  Left lung cancer   Pulmonary exam normal breath sounds clear to auscultation       Cardiovascular Exercise Tolerance: Good hypertension, Pt. on medications and Pt. on home beta blockers + dysrhythmias Supra Ventricular Tachycardia  Rhythm:Irregular Rate:Normal - Systolic murmurs, - Diastolic murmurs, - Friction Rub, - Carotid Bruit, - Peripheral Edema and - Systolic Click [id number] 32:67:12 Schuyler System-AP-300 ROUTINE RECORD [birth year] (69 yr) Female Black Vent. rate 64 BPM PR interval 158 ms QRS duration 88 ms QT/QTcB 442/455 ms P-R-T axes -12 -11 139 Sinus rhythm with Premature atrial complexes ST & T wave abnormality, consider anterolateral ischemia Abnormal ECG   Neuro/Psych negative neurological ROS  negative psych ROS   GI/Hepatic negative GI ROS, Neg liver ROS,   Endo/Other  negative endocrine ROS  Renal/GU negative Renal ROS     Musculoskeletal  (+) Arthritis ,   Abdominal   Peds  Hematology negative hematology ROS (+)   Anesthesia Other Findings Left lung - lobectomy lower lobe - lung cancer  Reproductive/Obstetrics negative OB ROS                            Anesthesia Physical Anesthesia Plan  ASA: 3  Anesthesia Plan: General   Post-op Pain Management:    Induction: Intravenous  PONV Risk Score and Plan: 3 and TIVA and Ondansetron  Airway Management Planned: Nasal Cannula,  Natural Airway and Simple Face Mask  Additional Equipment:   Intra-op Plan:   Post-operative Plan:   Informed Consent: I have reviewed the patients History and Physical, chart, labs and discussed the procedure including the risks, benefits and alternatives for the proposed anesthesia with the patient or authorized representative who has indicated his/her understanding and acceptance.     Dental advisory given  Plan Discussed with: CRNA and Surgeon  Anesthesia Plan Comments: (IV sedation with local block by Dr. Aline Brochure)        Anesthesia Quick Evaluation

## 2021-09-06 NOTE — Progress Notes (Signed)
No chief complaint on file. Meds ordered this encounter  Medications   acetaminophen-codeine (TYLENOL #3) 300-30 MG tablet    Sig: Take 1 tablet by mouth every 6 (six) hours as needed for moderate pain.    Dispense:  30 tablet    Refill:  0

## 2021-09-07 ENCOUNTER — Ambulatory Visit: Payer: Medicare Other | Admitting: Orthopedic Surgery

## 2021-09-07 ENCOUNTER — Encounter (HOSPITAL_COMMUNITY): Payer: Self-pay | Admitting: Orthopedic Surgery

## 2021-09-14 ENCOUNTER — Encounter: Payer: Self-pay | Admitting: Orthopedic Surgery

## 2021-09-14 ENCOUNTER — Ambulatory Visit (INDEPENDENT_AMBULATORY_CARE_PROVIDER_SITE_OTHER): Payer: Medicare Other | Admitting: Orthopedic Surgery

## 2021-09-14 ENCOUNTER — Other Ambulatory Visit: Payer: Self-pay

## 2021-09-14 DIAGNOSIS — G5622 Lesion of ulnar nerve, left upper limb: Secondary | ICD-10-CM

## 2021-09-14 DIAGNOSIS — Z9889 Other specified postprocedural states: Secondary | ICD-10-CM

## 2021-09-14 NOTE — Progress Notes (Signed)
Chief Complaint  Patient presents with   Post-op Follow-up    Carpal tunnel release 09/06/21   Encounter Diagnoses  Name Primary?   Cubital tunnel syndrome, left Yes   S/P carpal tunnel release September 06 2021      PRE-OPERATIVE DIAGNOSIS:  LEFT CARPAL TUNNEL SYNDROME   POST-OPERATIVE DIAGNOSIS:  LEFT CARPAL TUNNEL SYNDROME   PROCEDURE:  Procedure(s): LEFT CARPAL TUNNEL RELEASE (Right)  Miranda [name] is doing well she says the numbness is gone in her hand  Her wound looks clean  Sutures out on October 26

## 2021-09-15 ENCOUNTER — Other Ambulatory Visit: Payer: Self-pay | Admitting: Orthopedic Surgery

## 2021-09-15 DIAGNOSIS — M5442 Lumbago with sciatica, left side: Secondary | ICD-10-CM

## 2021-09-21 ENCOUNTER — Other Ambulatory Visit: Payer: Self-pay

## 2021-09-21 ENCOUNTER — Ambulatory Visit (INDEPENDENT_AMBULATORY_CARE_PROVIDER_SITE_OTHER): Payer: Medicare Other | Admitting: Orthopedic Surgery

## 2021-09-21 ENCOUNTER — Encounter: Payer: Self-pay | Admitting: Orthopedic Surgery

## 2021-09-21 DIAGNOSIS — Z9889 Other specified postprocedural states: Secondary | ICD-10-CM

## 2021-09-21 NOTE — Progress Notes (Signed)
Chief Complaint  Patient presents with   Post-op Follow-up    09/06/21 left CTR     Encounter Diagnosis  Name Primary?   S/P carpal tunnel release September 06 [id number]  Yes   87 year old female 2 weeks postop left carpal tunnel release  She has some pain in the palm which started yesterday  Her incision looks good the sutures were taken out  All of the numbness is gone including the small finger  I told her to avoid water submersion for couple of days and then activities as tolerated and follow-up as needed

## 2021-10-27 DIAGNOSIS — Z23 Encounter for immunization: Secondary | ICD-10-CM | POA: Diagnosis not present

## 2021-10-28 DIAGNOSIS — H402231 Chronic angle-closure glaucoma, bilateral, mild stage: Secondary | ICD-10-CM | POA: Diagnosis not present

## 2021-10-28 DIAGNOSIS — H02403 Unspecified ptosis of bilateral eyelids: Secondary | ICD-10-CM | POA: Diagnosis not present

## 2021-11-03 DIAGNOSIS — Z20822 Contact with and (suspected) exposure to covid-19: Secondary | ICD-10-CM | POA: Diagnosis not present

## 2021-11-17 ENCOUNTER — Other Ambulatory Visit: Payer: Self-pay | Admitting: Orthopedic Surgery

## 2021-11-17 DIAGNOSIS — M5442 Lumbago with sciatica, left side: Secondary | ICD-10-CM

## 2022-01-17 DIAGNOSIS — Z20822 Contact with and (suspected) exposure to covid-19: Secondary | ICD-10-CM | POA: Diagnosis not present

## 2022-02-11 DIAGNOSIS — Z20822 Contact with and (suspected) exposure to covid-19: Secondary | ICD-10-CM | POA: Diagnosis not present

## 2022-02-23 DIAGNOSIS — R7301 Impaired fasting glucose: Secondary | ICD-10-CM | POA: Diagnosis not present

## 2022-02-23 DIAGNOSIS — E782 Mixed hyperlipidemia: Secondary | ICD-10-CM | POA: Diagnosis not present

## 2022-02-27 DIAGNOSIS — E876 Hypokalemia: Secondary | ICD-10-CM | POA: Diagnosis not present

## 2022-02-27 DIAGNOSIS — M79604 Pain in right leg: Secondary | ICD-10-CM | POA: Diagnosis not present

## 2022-02-27 DIAGNOSIS — E669 Obesity, unspecified: Secondary | ICD-10-CM | POA: Diagnosis not present

## 2022-02-27 DIAGNOSIS — G5602 Carpal tunnel syndrome, left upper limb: Secondary | ICD-10-CM | POA: Diagnosis not present

## 2022-02-27 DIAGNOSIS — R7301 Impaired fasting glucose: Secondary | ICD-10-CM | POA: Diagnosis not present

## 2022-02-27 DIAGNOSIS — C50311 Malignant neoplasm of lower-inner quadrant of right female breast: Secondary | ICD-10-CM | POA: Diagnosis not present

## 2022-02-27 DIAGNOSIS — Z85118 Personal history of other malignant neoplasm of bronchus and lung: Secondary | ICD-10-CM | POA: Diagnosis not present

## 2022-02-27 DIAGNOSIS — M5442 Lumbago with sciatica, left side: Secondary | ICD-10-CM | POA: Diagnosis not present

## 2022-02-27 DIAGNOSIS — E782 Mixed hyperlipidemia: Secondary | ICD-10-CM | POA: Diagnosis not present

## 2022-02-27 DIAGNOSIS — N182 Chronic kidney disease, stage 2 (mild): Secondary | ICD-10-CM | POA: Diagnosis not present

## 2022-02-27 DIAGNOSIS — R002 Palpitations: Secondary | ICD-10-CM | POA: Diagnosis not present

## 2022-02-27 DIAGNOSIS — I129 Hypertensive chronic kidney disease with stage 1 through stage 4 chronic kidney disease, or unspecified chronic kidney disease: Secondary | ICD-10-CM | POA: Diagnosis not present

## 2022-03-09 DIAGNOSIS — R059 Cough, unspecified: Secondary | ICD-10-CM | POA: Diagnosis not present

## 2022-03-09 DIAGNOSIS — Z20822 Contact with and (suspected) exposure to covid-19: Secondary | ICD-10-CM | POA: Diagnosis not present

## 2022-03-09 DIAGNOSIS — R051 Acute cough: Secondary | ICD-10-CM | POA: Diagnosis not present

## 2022-03-13 DIAGNOSIS — Z20822 Contact with and (suspected) exposure to covid-19: Secondary | ICD-10-CM | POA: Diagnosis not present

## 2022-04-01 DIAGNOSIS — Z20822 Contact with and (suspected) exposure to covid-19: Secondary | ICD-10-CM | POA: Diagnosis not present

## 2022-04-03 DIAGNOSIS — Z20822 Contact with and (suspected) exposure to covid-19: Secondary | ICD-10-CM | POA: Diagnosis not present

## 2022-04-18 ENCOUNTER — Other Ambulatory Visit: Payer: Self-pay | Admitting: Orthopedic Surgery

## 2022-04-18 DIAGNOSIS — M5442 Lumbago with sciatica, left side: Secondary | ICD-10-CM

## 2022-06-08 DIAGNOSIS — H26492 Other secondary cataract, left eye: Secondary | ICD-10-CM | POA: Diagnosis not present

## 2022-06-08 DIAGNOSIS — H402231 Chronic angle-closure glaucoma, bilateral, mild stage: Secondary | ICD-10-CM | POA: Diagnosis not present

## 2022-06-08 DIAGNOSIS — M794 Hypertrophy of (infrapatellar) fat pad: Secondary | ICD-10-CM | POA: Diagnosis not present

## 2022-06-08 DIAGNOSIS — H04123 Dry eye syndrome of bilateral lacrimal glands: Secondary | ICD-10-CM | POA: Diagnosis not present

## 2022-06-08 DIAGNOSIS — H35033 Hypertensive retinopathy, bilateral: Secondary | ICD-10-CM | POA: Diagnosis not present

## 2022-06-08 DIAGNOSIS — H35013 Changes in retinal vascular appearance, bilateral: Secondary | ICD-10-CM | POA: Diagnosis not present

## 2022-06-09 ENCOUNTER — Other Ambulatory Visit (HOSPITAL_COMMUNITY): Payer: Self-pay | Admitting: Internal Medicine

## 2022-06-14 DIAGNOSIS — N6320 Unspecified lump in the left breast, unspecified quadrant: Secondary | ICD-10-CM | POA: Diagnosis not present

## 2022-06-15 ENCOUNTER — Other Ambulatory Visit (HOSPITAL_COMMUNITY): Payer: Self-pay | Admitting: Nurse Practitioner

## 2022-06-15 DIAGNOSIS — N6320 Unspecified lump in the left breast, unspecified quadrant: Secondary | ICD-10-CM

## 2022-06-30 ENCOUNTER — Inpatient Hospital Stay (HOSPITAL_COMMUNITY): Admission: RE | Admit: 2022-06-30 | Payer: Medicare Other | Source: Ambulatory Visit

## 2022-06-30 ENCOUNTER — Ambulatory Visit (HOSPITAL_COMMUNITY): Admission: RE | Admit: 2022-06-30 | Payer: Medicare Other | Source: Ambulatory Visit

## 2022-06-30 ENCOUNTER — Encounter (HOSPITAL_COMMUNITY): Payer: Self-pay

## 2022-07-03 ENCOUNTER — Other Ambulatory Visit (HOSPITAL_COMMUNITY): Payer: Self-pay | Admitting: Nurse Practitioner

## 2022-07-03 DIAGNOSIS — N6320 Unspecified lump in the left breast, unspecified quadrant: Secondary | ICD-10-CM

## 2022-07-12 ENCOUNTER — Telehealth: Payer: Self-pay | Admitting: Hematology and Oncology

## 2022-07-12 NOTE — Telephone Encounter (Signed)
Rescheduled appointment per room/resource. Patient is aware of the changes made to her upcoming appointment.

## 2022-07-19 ENCOUNTER — Other Ambulatory Visit: Payer: Self-pay | Admitting: Orthopedic Surgery

## 2022-07-26 ENCOUNTER — Ambulatory Visit (HOSPITAL_COMMUNITY)
Admission: RE | Admit: 2022-07-26 | Discharge: 2022-07-26 | Disposition: A | Discharge status: Home / Self Care | Payer: Medicare Other | Source: Ambulatory Visit | Attending: Nurse Practitioner | Admitting: Nurse Practitioner

## 2022-07-26 DIAGNOSIS — N6320 Unspecified lump in the left breast, unspecified quadrant: Secondary | ICD-10-CM | POA: Insufficient documentation

## 2022-07-26 DIAGNOSIS — Z1239 Encounter for other screening for malignant neoplasm of breast: Secondary | ICD-10-CM | POA: Diagnosis not present

## 2022-07-26 DIAGNOSIS — R922 Inconclusive mammogram: Secondary | ICD-10-CM | POA: Diagnosis not present

## 2022-07-26 DIAGNOSIS — R928 Other abnormal and inconclusive findings on diagnostic imaging of breast: Secondary | ICD-10-CM | POA: Diagnosis not present

## 2022-07-26 DIAGNOSIS — N6321 Unspecified lump in the left breast, upper outer quadrant: Secondary | ICD-10-CM | POA: Diagnosis not present

## 2022-08-10 ENCOUNTER — Ambulatory Visit: Payer: Medicare Other | Admitting: Hematology and Oncology

## 2022-08-15 NOTE — Progress Notes (Signed)
Patient Care Team: Celene Squibb, MD as PCP - General (Internal Medicine) Nicholas Lose, MD as Consulting Physician (Hematology and Oncology) Coralie Keens, MD as Consulting Physician (General Surgery)  DIAGNOSIS: No diagnosis found.  SUMMARY OF ONCOLOGIC HISTORY: Oncology History  Malignant neoplasm of lower-inner quadrant of right breast of female, estrogen receptor positive ([place])  06/12/2019 Initial Diagnosis   Screening mammogram detected right breast asymmetry. Diagnostic mammogram and US showed 0.5cm right breast mass. Biopsy showed IDC, grade 1, HER-2 - (0), ER+ 100%, PR+ 70%, Ki67 5%.    06/12/2019 Cancer Staging   Staging form: Breast, AJCC 8th Edition - Clinical stage from 06/12/2019: Stage IA (cT1a, cN0, cM0, G2, ER+, PR+, HER2-) - Signed by Nicholas Lose, MD on 06/17/2019   06/24/2019 Surgery   Right lumpectomy Ninfa Linden): IDC with intermediate grade DCIS, 0.6cm, grade 2, lymphovascular space invasion present, clear margins, and one right axillary lymph node negative.    06/24/2019 Cancer Staging   Staging form: Breast, AJCC 8th Edition - Pathologic stage from 06/24/2019: Stage IA (pT1a, pN0, cM0, G2, ER+, PR+, HER2-) - Signed by Gardenia Phlegm, NP on 10/01/2019   06/2019 -  Anti-estrogen oral therapy   Anastrozole daily     CHIEF COMPLIANT: Follow-up of right breast cancer  INTERVAL HISTORY: Miranda [name] is a 88 y.o. with above-mentioned history of right breast cancer having undergone lumpectomy. She presents to the clinic today for a follow-up.    ALLERGIES:  is allergic to bee venom.  MEDICATIONS:  Current Outpatient Medications  Medication Sig Dispense Refill   ibuprofen (ADVIL) 800 MG tablet TAKE 1 TABLET BY MOUTH EVERY 8 HOURS AS NEEDED 90 tablet 4   anastrozole (ARIMIDEX) 1 MG tablet Take 1 tablet (1 mg total) by mouth daily. 90 tablet 3   aspirin 81 MG EC tablet Take 81 mg by mouth daily.       cetirizine (ZYRTEC) 10 MG tablet Take 10 mg by  mouth daily as needed for allergies.     fluticasone (FLONASE) 50 MCG/ACT nasal spray Place 1 spray into both nostrils daily as needed for allergies or rhinitis.     gabapentin (NEURONTIN) 100 MG capsule TAKE 1 CAPSULE BY MOUTH THREE TIMES A DAY 90 capsule 2   KLOR-CON M20 20 MEQ tablet Take 20 mEq by mouth daily.   5   latanoprost (XALATAN) 0.005 % ophthalmic solution Place 1 drop into both eyes at bedtime.  3   metoprolol tartrate (LOPRESSOR) 25 MG tablet Take 25 mg by mouth daily as needed (rapid heart rate).   3   Polyvinyl Alcohol-Povidone (REFRESH OP) Place 1 drop into both eyes daily as needed (dry eyes).     rosuvastatin (CRESTOR) 10 MG tablet Take 10 mg by mouth daily.     telmisartan-hydrochlorothiazide (MICARDIS HCT) 80-25 MG tablet Take 1 tablet by mouth daily.  2   timolol (TIMOPTIC) 0.5 % ophthalmic solution Place 1 drop into both eyes daily.     torsemide (DEMADEX) 20 MG tablet Take 20 mg by mouth daily as needed (swelling).     No current facility-administered medications for this visit.    PHYSICAL EXAMINATION: ECOG PERFORMANCE STATUS: {CHL ONC ECOG [phone]}  There were no vitals filed for this visit. There were no vitals filed for this visit.  BREAST:*** No palpable masses or nodules in either right or left breasts. No palpable axillary supraclavicular or infraclavicular adenopathy no breast tenderness or nipple discharge. (exam performed in the presence of a chaperone)  LABORATORY DATA:  I have reviewed the data as listed    Latest Ref Rng & Units 06/19/2019    8:46 AM 03/22/2017   10:20 AM 07/09/2014   10:04 AM  CMP  Glucose 70 - 99 mg/dL 128  167    BUN 8 - 23 mg/dL 12  11    Creatinine 0.44 - 1.00 mg/dL 0.97  0.86  1.00   Sodium 135 - 145 mmol/L 138  140    Potassium 3.5 - 5.1 mmol/L 3.0  2.9    Chloride 98 - 111 mmol/L 98  99    CO2 22 - 32 mmol/L 29  31    Calcium 8.9 - 10.3 mg/dL 9.0  8.8    Total Protein 6.5 - 8.1 g/dL  7.3    Total Bilirubin 0.3 -  1.2 mg/dL  1.0    Alkaline Phos 38 - 126 U/L  71    AST 15 - 41 U/L  25    ALT 14 - 54 U/L  26      Lab Results  Component Value Date   WBC 5.8 06/19/2019   HGB 13.4 06/19/2019   HCT 42.0 06/19/2019   MCV 83.5 06/19/2019   PLT 232 06/19/2019   NEUTROABS 2.7 12/19/2008    ASSESSMENT & PLAN:  No problem-specific Assessment & Plan notes found for this encounter.    No orders of the defined types were placed in this encounter.  The patient has a good understanding of the overall plan. she agrees with it. she will call with any problems that may develop before the next visit here. Total time spent: 30 mins including face to face time and time spent for planning, charting and co-ordination of care   Suzzette Righter, [place] 08/15/22    I Gardiner Coins am scribing for Dr. Lindi Adie  ***

## 2022-08-16 ENCOUNTER — Other Ambulatory Visit: Payer: Self-pay

## 2022-08-16 ENCOUNTER — Inpatient Hospital Stay: Payer: Medicare Other | Attending: Hematology and Oncology | Admitting: Hematology and Oncology

## 2022-08-16 DIAGNOSIS — C50311 Malignant neoplasm of lower-inner quadrant of right female breast: Secondary | ICD-10-CM

## 2022-08-16 DIAGNOSIS — Z170 Estrogen receptor positive status [ER+]: Secondary | ICD-10-CM | POA: Diagnosis not present

## 2022-08-16 DIAGNOSIS — Z853 Personal history of malignant neoplasm of breast: Secondary | ICD-10-CM | POA: Insufficient documentation

## 2022-08-16 DIAGNOSIS — Z79811 Long term (current) use of aromatase inhibitors: Secondary | ICD-10-CM | POA: Insufficient documentation

## 2022-08-16 MED ORDER — ANASTROZOLE 1 MG PO TABS: 1.0000 mg | ORAL_TABLET | Freq: Every day | ORAL | 3 refills | Status: DC

## 2022-08-16 NOTE — Assessment & Plan Note (Signed)
06/16/2019: Right lumpectomy:Right lumpectomy Miranda [name]): IDC with intermediate grade DCIS, 0.6cm, grade 2, lymphovascular space invasion present, clear margins, and one right axillary lymph node negative.grade 1, HER-2 - (0), ER+ 100%, PR+ 70%, Ki67 5%. T1BN0 stage Ia  Current treatment: Anastrozole 1 mg daily started 07/01/2019 Anastrozole toxicities: Denies any adverse effects to anastrozole therapy  Breast cancer surveillance: 1.  Breast exam:   08/16/2022: Benign 2. mammogram and ultrasound 07/26/2022: Benign breast density category B  Return to clinic in 1 year for follow-up

## 2022-08-23 DIAGNOSIS — E782 Mixed hyperlipidemia: Secondary | ICD-10-CM | POA: Diagnosis not present

## 2022-08-23 DIAGNOSIS — R7301 Impaired fasting glucose: Secondary | ICD-10-CM | POA: Diagnosis not present

## 2022-08-29 ENCOUNTER — Other Ambulatory Visit (HOSPITAL_COMMUNITY): Payer: Self-pay | Admitting: Internal Medicine

## 2022-08-29 DIAGNOSIS — N182 Chronic kidney disease, stage 2 (mild): Secondary | ICD-10-CM | POA: Diagnosis not present

## 2022-08-29 DIAGNOSIS — Z13820 Encounter for screening for osteoporosis: Secondary | ICD-10-CM

## 2022-08-29 DIAGNOSIS — E876 Hypokalemia: Secondary | ICD-10-CM | POA: Diagnosis not present

## 2022-08-29 DIAGNOSIS — E782 Mixed hyperlipidemia: Secondary | ICD-10-CM | POA: Diagnosis not present

## 2022-08-29 DIAGNOSIS — R002 Palpitations: Secondary | ICD-10-CM | POA: Diagnosis not present

## 2022-08-29 DIAGNOSIS — I129 Hypertensive chronic kidney disease with stage 1 through stage 4 chronic kidney disease, or unspecified chronic kidney disease: Secondary | ICD-10-CM | POA: Diagnosis not present

## 2022-08-29 DIAGNOSIS — R7301 Impaired fasting glucose: Secondary | ICD-10-CM | POA: Diagnosis not present

## 2022-08-29 DIAGNOSIS — Z85118 Personal history of other malignant neoplasm of bronchus and lung: Secondary | ICD-10-CM | POA: Diagnosis not present

## 2022-08-29 DIAGNOSIS — Z23 Encounter for immunization: Secondary | ICD-10-CM | POA: Diagnosis not present

## 2022-08-29 DIAGNOSIS — M5442 Lumbago with sciatica, left side: Secondary | ICD-10-CM | POA: Diagnosis not present

## 2022-08-29 DIAGNOSIS — E669 Obesity, unspecified: Secondary | ICD-10-CM | POA: Diagnosis not present

## 2022-08-29 DIAGNOSIS — Z0000 Encounter for general adult medical examination without abnormal findings: Secondary | ICD-10-CM | POA: Diagnosis not present

## 2022-08-29 DIAGNOSIS — C50311 Malignant neoplasm of lower-inner quadrant of right female breast: Secondary | ICD-10-CM | POA: Diagnosis not present

## 2022-09-12 ENCOUNTER — Ambulatory Visit (HOSPITAL_COMMUNITY)
Admission: RE | Admit: 2022-09-12 | Discharge: 2022-09-12 | Disposition: A | Discharge status: Home / Self Care | Payer: Medicare Other | Source: Ambulatory Visit | Attending: Internal Medicine | Admitting: Internal Medicine

## 2022-09-12 DIAGNOSIS — Z23 Encounter for immunization: Secondary | ICD-10-CM | POA: Diagnosis not present

## 2022-09-12 DIAGNOSIS — Z13820 Encounter for screening for osteoporosis: Secondary | ICD-10-CM | POA: Insufficient documentation

## 2022-09-12 DIAGNOSIS — M85851 Other specified disorders of bone density and structure, right thigh: Secondary | ICD-10-CM | POA: Diagnosis not present

## 2022-09-12 DIAGNOSIS — Z853 Personal history of malignant neoplasm of breast: Secondary | ICD-10-CM | POA: Diagnosis not present

## 2022-09-12 DIAGNOSIS — Z780 Asymptomatic menopausal state: Secondary | ICD-10-CM | POA: Insufficient documentation

## 2022-11-02 DIAGNOSIS — H04123 Dry eye syndrome of bilateral lacrimal glands: Secondary | ICD-10-CM | POA: Diagnosis not present

## 2022-11-02 DIAGNOSIS — H02403 Unspecified ptosis of bilateral eyelids: Secondary | ICD-10-CM | POA: Diagnosis not present

## 2022-11-02 DIAGNOSIS — H02831 Dermatochalasis of right upper eyelid: Secondary | ICD-10-CM | POA: Diagnosis not present

## 2022-11-02 DIAGNOSIS — H402231 Chronic angle-closure glaucoma, bilateral, mild stage: Secondary | ICD-10-CM | POA: Diagnosis not present

## 2023-01-25 ENCOUNTER — Encounter: Payer: Self-pay | Admitting: Radiology

## 2023-02-22 DIAGNOSIS — E782 Mixed hyperlipidemia: Secondary | ICD-10-CM | POA: Diagnosis not present

## 2023-02-22 DIAGNOSIS — R7301 Impaired fasting glucose: Secondary | ICD-10-CM | POA: Diagnosis not present

## 2023-02-22 DIAGNOSIS — E876 Hypokalemia: Secondary | ICD-10-CM | POA: Diagnosis not present

## 2023-02-28 DIAGNOSIS — E876 Hypokalemia: Secondary | ICD-10-CM | POA: Diagnosis not present

## 2023-02-28 DIAGNOSIS — E782 Mixed hyperlipidemia: Secondary | ICD-10-CM | POA: Diagnosis not present

## 2023-02-28 DIAGNOSIS — G5602 Carpal tunnel syndrome, left upper limb: Secondary | ICD-10-CM | POA: Diagnosis not present

## 2023-02-28 DIAGNOSIS — N182 Chronic kidney disease, stage 2 (mild): Secondary | ICD-10-CM | POA: Diagnosis not present

## 2023-02-28 DIAGNOSIS — I129 Hypertensive chronic kidney disease with stage 1 through stage 4 chronic kidney disease, or unspecified chronic kidney disease: Secondary | ICD-10-CM | POA: Diagnosis not present

## 2023-02-28 DIAGNOSIS — C50919 Malignant neoplasm of unspecified site of unspecified female breast: Secondary | ICD-10-CM | POA: Diagnosis not present

## 2023-02-28 DIAGNOSIS — E669 Obesity, unspecified: Secondary | ICD-10-CM | POA: Diagnosis not present

## 2023-02-28 DIAGNOSIS — R002 Palpitations: Secondary | ICD-10-CM | POA: Diagnosis not present

## 2023-02-28 DIAGNOSIS — R7303 Prediabetes: Secondary | ICD-10-CM | POA: Diagnosis not present

## 2023-02-28 DIAGNOSIS — M5442 Lumbago with sciatica, left side: Secondary | ICD-10-CM | POA: Diagnosis not present

## 2023-02-28 DIAGNOSIS — M8580 Other specified disorders of bone density and structure, unspecified site: Secondary | ICD-10-CM | POA: Diagnosis not present

## 2023-02-28 DIAGNOSIS — R600 Localized edema: Secondary | ICD-10-CM | POA: Diagnosis not present

## 2023-03-20 ENCOUNTER — Encounter (HOSPITAL_COMMUNITY): Payer: Self-pay | Admitting: Emergency Medicine

## 2023-03-20 ENCOUNTER — Inpatient Hospital Stay (HOSPITAL_COMMUNITY)
Admission: EM | Admit: 2023-03-20 | Discharge: 2023-03-29 | DRG: 326 | Disposition: A | Discharge status: Home Health | Payer: Medicare Other | Attending: Internal Medicine | Admitting: Internal Medicine

## 2023-03-20 ENCOUNTER — Other Ambulatory Visit: Payer: Self-pay

## 2023-03-20 ENCOUNTER — Emergency Department (HOSPITAL_COMMUNITY): Payer: Medicare Other

## 2023-03-20 DIAGNOSIS — Z90710 Acquired absence of both cervix and uterus: Secondary | ICD-10-CM

## 2023-03-20 DIAGNOSIS — E669 Obesity, unspecified: Secondary | ICD-10-CM | POA: Diagnosis present

## 2023-03-20 DIAGNOSIS — K275 Chronic or unspecified peptic ulcer, site unspecified, with perforation: Secondary | ICD-10-CM | POA: Insufficient documentation

## 2023-03-20 DIAGNOSIS — I4719 Other supraventricular tachycardia: Secondary | ICD-10-CM | POA: Diagnosis not present

## 2023-03-20 DIAGNOSIS — Z85118 Personal history of other malignant neoplasm of bronchus and lung: Secondary | ICD-10-CM | POA: Diagnosis not present

## 2023-03-20 DIAGNOSIS — I10 Essential (primary) hypertension: Secondary | ICD-10-CM | POA: Diagnosis present

## 2023-03-20 DIAGNOSIS — Z7982 Long term (current) use of aspirin: Secondary | ICD-10-CM

## 2023-03-20 DIAGNOSIS — K5780 Diverticulitis of intestine, part unspecified, with perforation and abscess without bleeding: Secondary | ICD-10-CM | POA: Diagnosis present

## 2023-03-20 DIAGNOSIS — N179 Acute kidney failure, unspecified: Secondary | ICD-10-CM | POA: Diagnosis not present

## 2023-03-20 DIAGNOSIS — Z87442 Personal history of urinary calculi: Secondary | ICD-10-CM

## 2023-03-20 DIAGNOSIS — K659 Peritonitis, unspecified: Secondary | ICD-10-CM | POA: Diagnosis present

## 2023-03-20 DIAGNOSIS — Z79811 Long term (current) use of aromatase inhibitors: Secondary | ICD-10-CM | POA: Diagnosis not present

## 2023-03-20 DIAGNOSIS — I700 Atherosclerosis of aorta: Secondary | ICD-10-CM | POA: Diagnosis not present

## 2023-03-20 DIAGNOSIS — I129 Hypertensive chronic kidney disease with stage 1 through stage 4 chronic kidney disease, or unspecified chronic kidney disease: Secondary | ICD-10-CM | POA: Diagnosis present

## 2023-03-20 DIAGNOSIS — I471 Supraventricular tachycardia, unspecified: Secondary | ICD-10-CM | POA: Diagnosis not present

## 2023-03-20 DIAGNOSIS — Z8249 Family history of ischemic heart disease and other diseases of the circulatory system: Secondary | ICD-10-CM

## 2023-03-20 DIAGNOSIS — E782 Mixed hyperlipidemia: Secondary | ICD-10-CM | POA: Diagnosis not present

## 2023-03-20 DIAGNOSIS — N1832 Chronic kidney disease, stage 3b: Secondary | ICD-10-CM | POA: Diagnosis not present

## 2023-03-20 DIAGNOSIS — E861 Hypovolemia: Secondary | ICD-10-CM

## 2023-03-20 DIAGNOSIS — E876 Hypokalemia: Secondary | ICD-10-CM | POA: Diagnosis not present

## 2023-03-20 DIAGNOSIS — Z6832 Body mass index (BMI) 32.0-32.9, adult: Secondary | ICD-10-CM | POA: Diagnosis not present

## 2023-03-20 DIAGNOSIS — Z823 Family history of stroke: Secondary | ICD-10-CM

## 2023-03-20 DIAGNOSIS — Z800 Family history of malignant neoplasm of digestive organs: Secondary | ICD-10-CM

## 2023-03-20 DIAGNOSIS — J90 Pleural effusion, not elsewhere classified: Secondary | ICD-10-CM | POA: Diagnosis not present

## 2023-03-20 DIAGNOSIS — Z853 Personal history of malignant neoplasm of breast: Secondary | ICD-10-CM | POA: Diagnosis not present

## 2023-03-20 DIAGNOSIS — K219 Gastro-esophageal reflux disease without esophagitis: Secondary | ICD-10-CM | POA: Diagnosis present

## 2023-03-20 DIAGNOSIS — K3189 Other diseases of stomach and duodenum: Secondary | ICD-10-CM | POA: Diagnosis not present

## 2023-03-20 DIAGNOSIS — J9690 Respiratory failure, unspecified, unspecified whether with hypoxia or hypercapnia: Secondary | ICD-10-CM | POA: Diagnosis not present

## 2023-03-20 DIAGNOSIS — R000 Tachycardia, unspecified: Secondary | ICD-10-CM | POA: Diagnosis not present

## 2023-03-20 DIAGNOSIS — J45909 Unspecified asthma, uncomplicated: Secondary | ICD-10-CM | POA: Diagnosis present

## 2023-03-20 DIAGNOSIS — R1084 Generalized abdominal pain: Secondary | ICD-10-CM | POA: Diagnosis not present

## 2023-03-20 DIAGNOSIS — R1030 Lower abdominal pain, unspecified: Principal | ICD-10-CM

## 2023-03-20 DIAGNOSIS — I9589 Other hypotension: Secondary | ICD-10-CM | POA: Diagnosis not present

## 2023-03-20 DIAGNOSIS — K255 Chronic or unspecified gastric ulcer with perforation: Principal | ICD-10-CM | POA: Diagnosis present

## 2023-03-20 DIAGNOSIS — R109 Unspecified abdominal pain: Secondary | ICD-10-CM | POA: Diagnosis not present

## 2023-03-20 DIAGNOSIS — Z79899 Other long term (current) drug therapy: Secondary | ICD-10-CM

## 2023-03-20 DIAGNOSIS — R918 Other nonspecific abnormal finding of lung field: Secondary | ICD-10-CM | POA: Diagnosis not present

## 2023-03-20 DIAGNOSIS — R198 Other specified symptoms and signs involving the digestive system and abdomen: Secondary | ICD-10-CM | POA: Diagnosis not present

## 2023-03-20 DIAGNOSIS — Z6833 Body mass index (BMI) 33.0-33.9, adult: Secondary | ICD-10-CM | POA: Diagnosis not present

## 2023-03-20 DIAGNOSIS — Z91030 Bee allergy status: Secondary | ICD-10-CM

## 2023-03-20 LAB — CBC
HCT: 46.8 % — ABNORMAL HIGH (ref 36.0–46.0)
Hemoglobin: 14.8 g/dL (ref 12.0–15.0)
MCH: 27.1 pg (ref 26.0–34.0)
MCHC: 31.6 g/dL (ref 30.0–36.0)
MCV: 85.7 fL (ref 80.0–100.0)
Platelets: 256 10*3/uL (ref 150–400)
RBC: 5.46 MIL/uL — ABNORMAL HIGH (ref 3.87–5.11)
RDW: 14.2 % (ref 11.5–15.5)
WBC: 11.9 10*3/uL — ABNORMAL HIGH (ref 4.0–10.5)
nRBC: 0 % (ref 0.0–0.2)

## 2023-03-20 LAB — COMPREHENSIVE METABOLIC PANEL
ALT: 15 U/L (ref 0–44)
AST: 21 U/L (ref 15–41)
Albumin: 4.1 g/dL (ref 3.5–5.0)
Alkaline Phosphatase: 53 U/L (ref 38–126)
Anion gap: 11 (ref 5–15)
BUN: 15 mg/dL (ref 8–23)
CO2: 25 mmol/L (ref 22–32)
Calcium: 9.5 mg/dL (ref 8.9–10.3)
Chloride: 101 mmol/L (ref 98–111)
Creatinine, Ser: 1.15 mg/dL — ABNORMAL HIGH (ref 0.44–1.00)
GFR, Estimated: 46 mL/min — ABNORMAL LOW (ref >60)
Glucose, Bld: 178 mg/dL — ABNORMAL HIGH (ref 70–99)
Potassium: 3.7 mmol/L (ref 3.5–5.1)
Sodium: 137 mmol/L (ref 135–145)
Total Bilirubin: 0.8 mg/dL (ref 0.3–1.2)
Total Protein: 7.2 g/dL (ref 6.5–8.1)

## 2023-03-20 LAB — LIPASE, BLOOD: Lipase: 78 U/L — ABNORMAL HIGH (ref 11–51)

## 2023-03-20 MED ORDER — FLEET ENEMA 7-19 GM/118ML RE ENEM: 1.0000 | ENEMA | Freq: Once | RECTAL | Status: DC

## 2023-03-20 MED ORDER — PIPERACILLIN-TAZOBACTAM 3.375 G IVPB 30 MIN
3.3750 g | Freq: Once | INTRAVENOUS | Status: AC
  Administered 2023-03-20: 3.375 g via INTRAVENOUS
  Filled 2023-03-20: qty 50

## 2023-03-20 MED ORDER — HYDROMORPHONE HCL 1 MG/ML IJ SOLN
0.5000 mg | Freq: Once | INTRAMUSCULAR | Status: AC
  Administered 2023-03-20: 0.5 mg via INTRAVENOUS
  Filled 2023-03-20: qty 0.5

## 2023-03-20 MED ORDER — ONDANSETRON HCL 4 MG/2ML IJ SOLN
4.0000 mg | Freq: Once | INTRAMUSCULAR | Status: AC
  Administered 2023-03-20: 4 mg via INTRAVENOUS
  Filled 2023-03-20: qty 2

## 2023-03-20 MED ORDER — SODIUM CHLORIDE 0.9 % IV BOLUS
500.0000 mL | Freq: Once | INTRAVENOUS | Status: AC
  Administered 2023-03-20: 500 mL via INTRAVENOUS

## 2023-03-20 MED ORDER — SODIUM CHLORIDE 0.9 % IV SOLN: INTRAVENOUS | Status: DC

## 2023-03-20 MED ORDER — IOHEXOL 300 MG/ML  SOLN
75.0000 mL | Freq: Once | INTRAMUSCULAR | Status: AC | PRN
  Administered 2023-03-20: 75 mL via INTRAVENOUS

## 2023-03-20 NOTE — ED Provider Notes (Signed)
[place] EMERGENCY DEPARTMENT AT [facility] Provider Note   CSN: [id number] Arrival date & time: 03/20/23  1900     History {Add pertinent medical, surgical, social history, OB history to HPI:1} Chief Complaint  Patient presents with   Abdominal Pain    Miranda [name] is a 89 y.o. female.  Patient complains of lower abdominal pain.  She has a past medical history of lung cancer and breast cancer.  The pain started today and has been severe  The history is provided by the patient and medical records. No language interpreter was used.  Abdominal Pain Pain location:  LLQ Pain quality: aching   Pain radiates to:  Does not radiate Pain severity:  Moderate Onset quality:  Sudden Timing:  Constant Progression:  Worsening Chronicity:  New Context: not alcohol use   Relieved by:  Nothing Worsened by:  Nothing Ineffective treatments:  None tried Associated symptoms: no chest pain, no cough, no diarrhea, no fatigue and no hematuria        Home Medications Prior to Admission medications   Medication Sig Start Date End Date Taking? Authorizing Provider  anastrozole (ARIMIDEX) 1 MG tablet Take 1 tablet (1 mg total) by mouth daily. 08/16/22   Serena Croissant, MD  aspirin 81 MG EC tablet Take 81 mg by mouth daily.      [provider]  cetirizine (ZYRTEC) 10 MG tablet Take 10 mg by mouth daily as needed for allergies.    [provider]  fluticasone (FLONASE) 50 MCG/ACT nasal spray Place 1 spray into both nostrils daily as needed for allergies or rhinitis.    [provider]  gabapentin (NEURONTIN) 100 MG capsule TAKE 1 CAPSULE BY MOUTH THREE TIMES A DAY 07/19/22   Vickki Hearing, MD  ibuprofen (ADVIL) 800 MG tablet TAKE 1 TABLET BY MOUTH EVERY 8 HOURS AS NEEDED 04/18/22   Vickki Hearing, MD  KLOR-CON M20 20 MEQ tablet Take 20 mEq by mouth daily.  01/30/17   [provider]  latanoprost (XALATAN) 0.005 % ophthalmic solution Place 1  drop into both eyes at bedtime. 02/20/17   [provider]  metoprolol tartrate (LOPRESSOR) 25 MG tablet Take 25 mg by mouth daily as needed (rapid heart rate).  12/04/17   [provider]  Polyvinyl Alcohol-Povidone (REFRESH OP) Place 1 drop into both eyes daily as needed (dry eyes).    [provider]  rosuvastatin (CRESTOR) 10 MG tablet Take 10 mg by mouth daily. 05/23/21   [provider]  telmisartan-hydrochlorothiazide (MICARDIS HCT) 80-25 MG tablet Take 1 tablet by mouth daily. 11/06/17   [provider]  timolol (TIMOPTIC) 0.5 % ophthalmic solution Place 1 drop into both eyes daily. 05/07/21   [provider]  torsemide (DEMADEX) 20 MG tablet Take 20 mg by mouth daily as needed (swelling).    [provider]      Allergies    Bee venom    Review of Systems   Review of Systems  Constitutional:  Negative for appetite change and fatigue.  HENT:  Negative for congestion, ear discharge and sinus pressure.   Eyes:  Negative for discharge.  Respiratory:  Negative for cough.   Cardiovascular:  Negative for chest pain.  Gastrointestinal:  Positive for abdominal pain. Negative for diarrhea.  Genitourinary:  Negative for frequency and hematuria.  Musculoskeletal:  Negative for back pain.  Skin:  Negative for rash.  Neurological:  Negative for seizures and headaches.  Psychiatric/Behavioral:  Negative for hallucinations.     Physical Exam Updated Vital Signs BP 125/67 (BP Location: Left Arm)   Pulse 75   Resp 16   Ht 5' (1.524 m)   Wt 64.4 kg   SpO2 100%   BMI 27.73 kg/m  Physical Exam Vitals and nursing note reviewed.  Constitutional:      Appearance: She is well-developed.  HENT:     Head: Normocephalic.     Nose: Nose normal.  Eyes:     General: No scleral icterus.    Conjunctiva/sclera: Conjunctivae normal.  Neck:     Thyroid: No thyromegaly.  Cardiovascular:     Rate and Rhythm: Normal rate and regular  rhythm.     Heart sounds: No murmur heard.    No friction rub. No gallop.  Pulmonary:     Breath sounds: No stridor. No wheezing or rales.  Chest:     Chest wall: No tenderness.  Abdominal:     General: There is no distension.     Tenderness: There is abdominal tenderness. There is no rebound.  Musculoskeletal:        General: Normal range of motion.     Cervical back: Neck supple.  Lymphadenopathy:     Cervical: No cervical adenopathy.  Skin:    Findings: No erythema or rash.  Neurological:     Mental Status: She is oriented to person, place, and time.     Motor: No abnormal muscle tone.     Coordination: Coordination normal.  Psychiatric:        Behavior: Behavior normal.     ED Results / Procedures / Treatments   Labs (all labs ordered are listed, but only abnormal results are displayed) Labs Reviewed  LIPASE, BLOOD - Abnormal; Notable for the following components:      Result Value   Lipase 78 (*)    All other components within normal limits  COMPREHENSIVE METABOLIC PANEL - Abnormal; Notable for the following components:   Glucose, Bld 178 (*)    Creatinine, Ser 1.15 (*)    GFR, Estimated 46 (*)    All other components within normal limits  CBC - Abnormal; Notable for the following components:   WBC 11.9 (*)    RBC 5.46 (*)    HCT 46.8 (*)    All other components within normal limits  URINALYSIS, ROUTINE W REFLEX MICROSCOPIC    EKG None  Radiology CT ABDOMEN PELVIS W CONTRAST  Result Date: 03/20/2023 CLINICAL DATA:  Generalized abdominal pain EXAM: CT ABDOMEN AND PELVIS WITH CONTRAST TECHNIQUE: Multidetector CT imaging of the abdomen and pelvis was performed using the standard protocol following bolus administration of intravenous contrast. RADIATION DOSE REDUCTION: This exam was performed according to the departmental dose-optimization program which includes automated exposure control, adjustment of the mA and/or kV according to patient size and/or use of  iterative reconstruction technique. CONTRAST:  75mL OMNIPAQUE IOHEXOL 300 MG/ML  SOLN COMPARISON:  03/22/2017 FINDINGS: Lower chest: Trace right pleural effusion. No acute airspace disease. The heart is enlarged, with calcifications of the aortic and mitral valves. No pericardial effusion. Hepatobiliary: Either adherent 3 mm gallbladder calculus or mural calcification within the gallbladder fundus. No evidence of acute cholecystitis. The liver is unremarkable. Pancreas: Unremarkable. No pancreatic ductal dilatation or surrounding inflammatory changes. Spleen: Normal in size without focal abnormality. Adrenals/Urinary Tract: The kidneys enhance normally. No urinary tract calculi or obstructive uropathy. The adrenals are unremarkable. The bladder is minimally distended, which limits its evaluation. Stomach/Bowel: Pneumoperitoneum  is identified, greatest within the right hemiabdomen. Findings are consistent with perforated viscus. Extraluminal gas and fluid track along the right anterior abdomen to the region of the cecum and ascending colon, suspicious for ascending colonic perforation. Surgical consultation is recommended. There is segmental dilation of jejunal loops within the left mid abdomen, measuring up to 3 cm in diameter, favor regional ileus. No evidence of bowel obstruction. Distal colon is decompressed. Moderate retained stool within the rectal vault could reflect fecal impaction. Vascular/Lymphatic: Aortic atherosclerosis. No enlarged abdominal or pelvic lymph nodes. Reproductive: Status post hysterectomy. No adnexal masses. Other: Pneumoperitoneum and free fluid within the abdomen as above, greatest in the anterior right hemiabdomen. No loculated fluid collection or evidence of abscess at this time. Small fat containing right inguinal hernia. Musculoskeletal: No acute or destructive bony lesions. Reconstructed images demonstrate no additional findings. IMPRESSION: 1. Findings compatible with perforated  viscus, with free fluid and free intraperitoneal gas tracking within the right anterior hemiabdomen along the cecum and ascending colon, suspicious for colonic perforation. Surgical consultation is recommended. 2. Segmental jejunal dilation within the left mid abdomen, favor regional ileus. 3. Retained stool within the rectal vault suspicious for fecal impaction. 4. Trace right pleural effusion. 5. Adherent 3 mm gallbladder calculus versus focal mural calcification. No evidence of acute cholecystitis. 6.  Aortic Atherosclerosis (ICD10-I70.0). Critical Value/emergent results were called by telephone at the time of interpretation on 03/20/2023 at 10:32 pm to provider [facility] Sharline Lehane , who verbally acknowledged these results. Electronically Signed   By: Sharlet Salina M.D.   On: 03/20/2023 22:33    Procedures Procedures  {Document cardiac monitor, telemetry assessment procedure when appropriate:1}  Medications Ordered in ED Medications  HYDROmorphone (DILAUDID) injection 0.5 mg (has no administration in time range)  ondansetron (ZOFRAN) injection 4 mg (has no administration in time range)  sodium chloride 0.9 % bolus 500 mL (has no administration in time range)  piperacillin-tazobactam (ZOSYN) IVPB 3.375 g (has no administration in time range)  iohexol (OMNIPAQUE) 300 MG/ML solution 75 mL (75 mLs Intravenous Contrast Given 03/20/23 2213)    ED Course/ Medical Decision Making/ A&P   {  CRITICAL CARE Performed by: Bethann Berkshire Total critical care time: 45 minutes Critical care time was exclusive of separately billable procedures and treating other patients. Critical care was necessary to treat or prevent imminent or life-threatening deterioration. Critical care was time spent personally by me on the following activities: development of treatment plan with patient and/or surrogate as well as nursing, discussions with consultants, evaluation of patient's response to treatment, examination of patient,  obtaining history from patient or surrogate, ordering and performing treatments and interventions, ordering and review of laboratory studies, ordering and review of radiographic studies, pulse oximetry and re-evaluation of patient's condition.  Click here for ABCD2, HEART and other calculatorsREFRESH Note before signing :1}                          Medical Decision Making Amount and/or Complexity of Data Reviewed Labs: ordered. Radiology: ordered.  Risk Prescription drug management.   Patient with ruptured viscus.  General surgery Dr. Lovell Sheehan is evaluating the patient  {Document critical care time when appropriate:1} {Document review of labs and clinical decision tools ie heart score, Chads2Vasc2 etc:1}  {Document your independent review of radiology images, and any outside records:1} {Document your discussion with family members, caretakers, and with consultants:1} {Document social determinants of health affecting pt's care:1} {Document your decision making why or  why not admission, treatments were needed:1} Final Clinical Impression(s) / ED Diagnoses Final diagnoses:  None    Rx / DC Orders ED Discharge Orders     None

## 2023-03-20 NOTE — Consult Note (Signed)
Reason for Consult: Perforated viscus Referring Physician: Dr. Estell Harpin, ER  Miranda [name] is an 89 y.o. female.  HPI: Patient is an 89 year old black female who presented to the emergency room with diffuse abdominal pain.  It started earlier today around 3:00.  Patient has had some nausea but no vomiting.  She denies any diarrhea, fever, chills.  A CT scan of the abdomen pelvis revealed pneumoperitoneum with some free air along the right side of the abdomen extending up to the liver, consistent with a possible perforated colon.  She also has possible stool impaction.  Past Medical History:  Diagnosis Date   Asthma    Cancer    Essential hypertension, benign    History of kidney stones    Mixed hyperlipidemia     Past Surgical History:  Procedure Laterality Date   ABDOMINAL HYSTERECTOMY     excessive bleeding, no cancer    BREAST LUMPECTOMY Right 2020   CARPAL TUNNEL RELEASE Left 09/06/2021   Procedure: CARPAL TUNNEL RELEASE;  Surgeon: Vickki Hearing, MD;  Location: [facility];  Service: Orthopedics;  Laterality: Left;   Left knee arthroscopy due to torn ligament and cartiallege  2006   [facility]    Left lung - lobectomy lower lobe - lung cancer     RADIOACTIVE SEED GUIDED PARTIAL MASTECTOMY WITH AXILLARY SENTINEL LYMPH NODE BIOPSY Right 06/24/2019   Procedure: RADIOACTIVE SEED GUIDED RIGHT BREAST PARTIAL MASTECTOMY WITH  SENTINEL LYMPH NODE BIOPSY;  Surgeon: Abigail Miyamoto, MD;  Location: [facility];  Service: General;  Laterality: Right;    Family History  Problem Relation Age of Onset   Colon cancer Father    Heart attack Mother    Anuerysm Mother        Brain    Stroke Sister    Hypertension Sister    Melanoma Sister    Hypertension Sister    Hypertension Son    Cancer Other        Family history    Arthritis Other        Family history     Social History:  reports that she has never smoked. She has never used smokeless tobacco. She reports current alcohol use. She  reports that she does not use drugs.  Allergies:  Allergies  Allergen Reactions   Bee Venom Swelling    Medications: I have reviewed the patient's current medications. Prior to Admission: (Not in a hospital admission)   Results for orders placed or performed during the hospital encounter of 03/20/23 (from the past 48 hour(s))  Lipase, blood     Status: Abnormal   Collection Time: 03/20/23  8:08 PM  Result Value Ref Range   Lipase 78 (H) 11 - 51 U/L    Comment: Performed at [facility], [address] [place], [place] [address]  Comprehensive metabolic panel     Status: Abnormal   Collection Time: 03/20/23  8:08 PM  Result Value Ref Range   Sodium 137 135 - 145 mmol/L   Potassium 3.7 3.5 - 5.1 mmol/L   Chloride 101 98 - 111 mmol/L   CO2 25 22 - 32 mmol/L   Glucose, Bld 178 (H) 70 - 99 mg/dL    Comment: Glucose reference range applies only to samples taken after fasting for at least 8 hours.   BUN 15 8 - 23 mg/dL   Creatinine, Ser 6.04 (H) 0.44 - 1.00 mg/dL   Calcium 9.5 8.9 - 54.0 mg/dL   Total Protein 7.2 6.5 -  8.1 g/dL   Albumin 4.1 3.5 - 5.0 g/dL   AST 21 15 - 41 U/L   ALT 15 0 - 44 U/L   Alkaline Phosphatase 53 38 - 126 U/L   Total Bilirubin 0.8 0.3 - 1.2 mg/dL   GFR, Estimated 46 (L) >60 mL/min    Comment: (NOTE) Calculated using the CKD-EPI Creatinine Equation (2021)    Anion gap 11 5 - 15    Comment: Performed at [facility], [address], [place], [place] [address]  CBC     Status: Abnormal   Collection Time: 03/20/23  8:08 PM  Result Value Ref Range   WBC 11.9 (H) 4.0 - 10.5 K/uL   RBC 5.46 (H) 3.87 - 5.11 MIL/uL   Hemoglobin 14.8 12.0 - 15.0 g/dL   HCT 21.3 (H) 08.6 - 57.8 %   MCV 85.7 80.0 - 100.0 fL   MCH 27.1 26.0 - 34.0 pg   MCHC 31.6 30.0 - 36.0 g/dL   RDW 46.9 62.9 - 52.8 %   Platelets 256 150 - 400 K/uL   nRBC 0.0 0.0 - 0.2 %    Comment: Performed at [facility], [address], [place], [place] [address]    CT ABDOMEN PELVIS W  CONTRAST  Result Date: 03/20/2023 CLINICAL DATA:  Generalized abdominal pain EXAM: CT ABDOMEN AND PELVIS WITH CONTRAST TECHNIQUE: Multidetector CT imaging of the abdomen and pelvis was performed using the standard protocol following bolus administration of intravenous contrast. RADIATION DOSE REDUCTION: This exam was performed according to the departmental dose-optimization program which includes automated exposure control, adjustment of the mA and/or kV according to patient size and/or use of iterative reconstruction technique. CONTRAST:  75mL OMNIPAQUE IOHEXOL 300 MG/ML  SOLN COMPARISON:  03/22/2017 FINDINGS: Lower chest: Trace right pleural effusion. No acute airspace disease. The heart is enlarged, with calcifications of the aortic and mitral valves. No pericardial effusion. Hepatobiliary: Either adherent 3 mm gallbladder calculus or mural calcification within the gallbladder fundus. No evidence of acute cholecystitis. The liver is unremarkable. Pancreas: Unremarkable. No pancreatic ductal dilatation or surrounding inflammatory changes. Spleen: Normal in size without focal abnormality. Adrenals/Urinary Tract: The kidneys enhance normally. No urinary tract calculi or obstructive uropathy. The adrenals are unremarkable. The bladder is minimally distended, which limits its evaluation. Stomach/Bowel: Pneumoperitoneum is identified, greatest within the right hemiabdomen. Findings are consistent with perforated viscus. Extraluminal gas and fluid track along the right anterior abdomen to the region of the cecum and ascending colon, suspicious for ascending colonic perforation. Surgical consultation is recommended. There is segmental dilation of jejunal loops within the left mid abdomen, measuring up to 3 cm in diameter, favor regional ileus. No evidence of bowel obstruction. Distal colon is decompressed. Moderate retained stool within the rectal vault could reflect fecal impaction. Vascular/Lymphatic: Aortic  atherosclerosis. No enlarged abdominal or pelvic lymph nodes. Reproductive: Status post hysterectomy. No adnexal masses. Other: Pneumoperitoneum and free fluid within the abdomen as above, greatest in the anterior right hemiabdomen. No loculated fluid collection or evidence of abscess at this time. Small fat containing right inguinal hernia. Musculoskeletal: No acute or destructive bony lesions. Reconstructed images demonstrate no additional findings. IMPRESSION: 1. Findings compatible with perforated viscus, with free fluid and free intraperitoneal gas tracking within the right anterior hemiabdomen along the cecum and ascending colon, suspicious for colonic perforation. Surgical consultation is recommended. 2. Segmental jejunal dilation within the left mid abdomen, favor regional ileus. 3. Retained stool within the rectal vault suspicious for fecal impaction. 4. Trace  right pleural effusion. 5. Adherent 3 mm gallbladder calculus versus focal mural calcification. No evidence of acute cholecystitis. 6.  Aortic Atherosclerosis (ICD10-I70.0). Critical Value/emergent results were called by telephone at the time of interpretation on 03/20/2023 at 10:32 pm to provider [facility] ZAMMIT , who verbally acknowledged these results. Electronically Signed   By: Sharlet Salina M.D.   On: 03/20/2023 22:33    ROS:  Pertinent items are noted in HPI.  Blood pressure (!) 163/94, pulse 92, resp. rate 20, height 5' (1.524 m), weight 64.4 kg, SpO2 94 %. Physical Exam: Well-developed well-nourished black female who looks her age-appropriate with mild discomfort Head is normocephalic, atraumatic Lungs clear to auscultation with equal breath sounds bilaterally Heart examination reveals regular rate and rhythm without S3, S4, murmurs Abdomen is soft with tenderness along the right side of the abdomen.  She does not have diffuse peritonitis.  CT scan images personally reviewed  Assessment/Plan: Impression: Perforated viscus which  appears to be localized to the right side of the abdomen History of SVT for which she takes Lopressor. History of breast cancer and lung cancer Plan: Patient will be admitted for IV fluids, IV antibiotics.  She is stable at the present time.  Will reassess to judge.  Suspect she may need exploratory laparotomy if her condition continues.  This has been explained to the patient and family.  Will monitor expectantly with you.  Franky Macho 03/20/2023, 11:41 PM

## 2023-03-20 NOTE — ED Triage Notes (Signed)
Pt c/o generalized abdominal pain since 1500 today. Denies N/V/D or urinary symptoms.

## 2023-03-21 ENCOUNTER — Encounter (HOSPITAL_COMMUNITY)
Admission: EM | Disposition: A | Discharge status: Home Health | Payer: Self-pay | Source: Home / Self Care | Attending: Internal Medicine

## 2023-03-21 ENCOUNTER — Inpatient Hospital Stay (HOSPITAL_COMMUNITY): Payer: Medicare Other

## 2023-03-21 ENCOUNTER — Encounter (HOSPITAL_COMMUNITY): Payer: Self-pay | Admitting: Internal Medicine

## 2023-03-21 ENCOUNTER — Inpatient Hospital Stay (HOSPITAL_COMMUNITY): Payer: Medicare Other | Admitting: Certified Registered"

## 2023-03-21 DIAGNOSIS — E861 Hypovolemia: Secondary | ICD-10-CM

## 2023-03-21 DIAGNOSIS — E782 Mixed hyperlipidemia: Secondary | ICD-10-CM

## 2023-03-21 DIAGNOSIS — R198 Other specified symptoms and signs involving the digestive system and abdomen: Secondary | ICD-10-CM | POA: Diagnosis present

## 2023-03-21 DIAGNOSIS — I9589 Other hypotension: Secondary | ICD-10-CM | POA: Diagnosis not present

## 2023-03-21 DIAGNOSIS — Z87442 Personal history of urinary calculi: Secondary | ICD-10-CM | POA: Diagnosis not present

## 2023-03-21 DIAGNOSIS — I4719 Other supraventricular tachycardia: Secondary | ICD-10-CM | POA: Diagnosis present

## 2023-03-21 DIAGNOSIS — I10 Essential (primary) hypertension: Secondary | ICD-10-CM | POA: Diagnosis not present

## 2023-03-21 DIAGNOSIS — Z85118 Personal history of other malignant neoplasm of bronchus and lung: Secondary | ICD-10-CM | POA: Diagnosis not present

## 2023-03-21 DIAGNOSIS — I471 Supraventricular tachycardia, unspecified: Secondary | ICD-10-CM

## 2023-03-21 DIAGNOSIS — Z6833 Body mass index (BMI) 33.0-33.9, adult: Secondary | ICD-10-CM | POA: Diagnosis not present

## 2023-03-21 DIAGNOSIS — J45909 Unspecified asthma, uncomplicated: Secondary | ICD-10-CM | POA: Diagnosis present

## 2023-03-21 DIAGNOSIS — R918 Other nonspecific abnormal finding of lung field: Secondary | ICD-10-CM | POA: Diagnosis not present

## 2023-03-21 DIAGNOSIS — R109 Unspecified abdominal pain: Secondary | ICD-10-CM

## 2023-03-21 DIAGNOSIS — K219 Gastro-esophageal reflux disease without esophagitis: Secondary | ICD-10-CM | POA: Diagnosis present

## 2023-03-21 DIAGNOSIS — J9690 Respiratory failure, unspecified, unspecified whether with hypoxia or hypercapnia: Secondary | ICD-10-CM | POA: Diagnosis not present

## 2023-03-21 DIAGNOSIS — Z7982 Long term (current) use of aspirin: Secondary | ICD-10-CM | POA: Diagnosis not present

## 2023-03-21 DIAGNOSIS — K255 Chronic or unspecified gastric ulcer with perforation: Secondary | ICD-10-CM | POA: Diagnosis present

## 2023-03-21 DIAGNOSIS — Z853 Personal history of malignant neoplasm of breast: Secondary | ICD-10-CM | POA: Diagnosis not present

## 2023-03-21 DIAGNOSIS — K5780 Diverticulitis of intestine, part unspecified, with perforation and abscess without bleeding: Secondary | ICD-10-CM | POA: Diagnosis present

## 2023-03-21 DIAGNOSIS — Z79899 Other long term (current) drug therapy: Secondary | ICD-10-CM | POA: Diagnosis not present

## 2023-03-21 DIAGNOSIS — Z79811 Long term (current) use of aromatase inhibitors: Secondary | ICD-10-CM | POA: Diagnosis not present

## 2023-03-21 DIAGNOSIS — K275 Chronic or unspecified peptic ulcer, site unspecified, with perforation: Secondary | ICD-10-CM | POA: Diagnosis not present

## 2023-03-21 DIAGNOSIS — E876 Hypokalemia: Secondary | ICD-10-CM | POA: Diagnosis not present

## 2023-03-21 DIAGNOSIS — E669 Obesity, unspecified: Secondary | ICD-10-CM | POA: Diagnosis present

## 2023-03-21 DIAGNOSIS — K659 Peritonitis, unspecified: Secondary | ICD-10-CM | POA: Diagnosis present

## 2023-03-21 DIAGNOSIS — J90 Pleural effusion, not elsewhere classified: Secondary | ICD-10-CM | POA: Diagnosis not present

## 2023-03-21 DIAGNOSIS — K3189 Other diseases of stomach and duodenum: Secondary | ICD-10-CM | POA: Diagnosis not present

## 2023-03-21 DIAGNOSIS — I129 Hypertensive chronic kidney disease with stage 1 through stage 4 chronic kidney disease, or unspecified chronic kidney disease: Secondary | ICD-10-CM | POA: Diagnosis present

## 2023-03-21 DIAGNOSIS — N1832 Chronic kidney disease, stage 3b: Secondary | ICD-10-CM | POA: Diagnosis present

## 2023-03-21 DIAGNOSIS — N179 Acute kidney failure, unspecified: Secondary | ICD-10-CM | POA: Diagnosis not present

## 2023-03-21 HISTORY — PX: GASTRORRHAPHY: SHX6263

## 2023-03-21 HISTORY — PX: LAPAROTOMY: SHX154

## 2023-03-21 LAB — BLOOD GAS, ARTERIAL
Acid-base deficit: 5.6 mmol/L — ABNORMAL HIGH (ref 0.0–2.0)
Bicarbonate: 22.9 mmol/L (ref 20.0–28.0)
Drawn by: 35043
FIO2: 100 %
O2 Saturation: 99.4 %
Patient temperature: 36.4
pCO2 arterial: 55 mmHg — ABNORMAL HIGH (ref 32–48)
pH, Arterial: 7.23 — ABNORMAL LOW (ref 7.35–7.45)
pO2, Arterial: 183 mmHg — ABNORMAL HIGH (ref 83–108)

## 2023-03-21 LAB — BASIC METABOLIC PANEL
Anion gap: 13 (ref 5–15)
BUN: 16 mg/dL (ref 8–23)
CO2: 20 mmol/L — ABNORMAL LOW (ref 22–32)
Calcium: 9.4 mg/dL (ref 8.9–10.3)
Chloride: 106 mmol/L (ref 98–111)
Creatinine, Ser: 1.1 mg/dL — ABNORMAL HIGH (ref 0.44–1.00)
GFR, Estimated: 49 mL/min — ABNORMAL LOW (ref >60)
Glucose, Bld: 145 mg/dL — ABNORMAL HIGH (ref 70–99)
Potassium: 4.1 mmol/L (ref 3.5–5.1)
Sodium: 139 mmol/L (ref 135–145)

## 2023-03-21 LAB — CBC
HCT: 49.4 % — ABNORMAL HIGH (ref 36.0–46.0)
Hemoglobin: 15.8 g/dL — ABNORMAL HIGH (ref 12.0–15.0)
MCH: 27.8 pg (ref 26.0–34.0)
MCHC: 32 g/dL (ref 30.0–36.0)
MCV: 87 fL (ref 80.0–100.0)
Platelets: 238 10*3/uL (ref 150–400)
RBC: 5.68 MIL/uL — ABNORMAL HIGH (ref 3.87–5.11)
RDW: 14.6 % (ref 11.5–15.5)
WBC: 8.8 10*3/uL (ref 4.0–10.5)
nRBC: 0 % (ref 0.0–0.2)

## 2023-03-21 LAB — MAGNESIUM: Magnesium: 2 mg/dL (ref 1.7–2.4)

## 2023-03-21 LAB — PHOSPHORUS: Phosphorus: 5.1 mg/dL — ABNORMAL HIGH (ref 2.5–4.6)

## 2023-03-21 LAB — MRSA NEXT GEN BY PCR, NASAL: MRSA by PCR Next Gen: NOT DETECTED

## 2023-03-21 LAB — CULTURE, BLOOD (ROUTINE X 2): Special Requests: ADEQUATE

## 2023-03-21 SURGERY — LAPAROTOMY, EXPLORATORY
Anesthesia: General | Site: Abdomen

## 2023-03-21 MED ORDER — DEXMEDETOMIDINE HCL IN NACL 400 MCG/100ML IV SOLN
INTRAVENOUS | Status: DC | PRN
  Administered 2023-03-21: 12 ug via INTRAVENOUS

## 2023-03-21 MED ORDER — LACTATED RINGERS IV BOLUS
1000.0000 mL | Freq: Once | INTRAVENOUS | Status: AC
  Administered 2023-03-21: 1000 mL via INTRAVENOUS

## 2023-03-21 MED ORDER — SODIUM CHLORIDE 0.9 % IV SOLN
25.0000 ug/h | INTRAVENOUS | Status: DC
  Filled 2023-03-21: qty 50

## 2023-03-21 MED ORDER — SUCCINYLCHOLINE CHLORIDE 200 MG/10ML IV SOSY
PREFILLED_SYRINGE | INTRAVENOUS | Status: DC | PRN
  Administered 2023-03-21: 100 mg via INTRAVENOUS

## 2023-03-21 MED ORDER — HYDROMORPHONE HCL 1 MG/ML IJ SOLN
0.5000 mg | INTRAMUSCULAR | Status: DC | PRN
  Administered 2023-03-21 (×3): 0.5 mg via INTRAVENOUS
  Filled 2023-03-21 (×3): qty 0.5

## 2023-03-21 MED ORDER — SUCCINYLCHOLINE CHLORIDE 200 MG/10ML IV SOSY
PREFILLED_SYRINGE | INTRAVENOUS | Status: AC
  Filled 2023-03-21: qty 10

## 2023-03-21 MED ORDER — PIPERACILLIN-TAZOBACTAM 3.375 G IVPB
3.3750 g | Freq: Three times a day (TID) | INTRAVENOUS | Status: AC
  Administered 2023-03-21 – 2023-03-28 (×24): 3.375 g via INTRAVENOUS
  Filled 2023-03-21 (×24): qty 50

## 2023-03-21 MED ORDER — FENTANYL CITRATE PF 50 MCG/ML IJ SOSY
PREFILLED_SYRINGE | INTRAMUSCULAR | Status: AC
  Filled 2023-03-21: qty 1

## 2023-03-21 MED ORDER — PHENYLEPHRINE 80 MCG/ML (10ML) SYRINGE FOR IV PUSH (FOR BLOOD PRESSURE SUPPORT)
PREFILLED_SYRINGE | INTRAVENOUS | Status: AC
  Filled 2023-03-21: qty 10

## 2023-03-21 MED ORDER — FENTANYL CITRATE PF 50 MCG/ML IJ SOSY
50.0000 ug | PREFILLED_SYRINGE | Freq: Once | INTRAMUSCULAR | Status: AC
  Administered 2023-03-21: 50 ug via INTRAVENOUS

## 2023-03-21 MED ORDER — PHENYLEPHRINE HCL-NACL 20-0.9 MG/250ML-% IV SOLN
INTRAVENOUS | Status: DC | PRN
  Administered 2023-03-21: 50 ug/min via INTRAVENOUS

## 2023-03-21 MED ORDER — ONDANSETRON HCL 4 MG PO TABS: 4.0000 mg | ORAL_TABLET | Freq: Four times a day (QID) | ORAL | Status: DC | PRN

## 2023-03-21 MED ORDER — SODIUM CHLORIDE 0.9 % IR SOLN
Status: DC | PRN
  Administered 2023-03-21 (×6): 1000 mL

## 2023-03-21 MED ORDER — FENTANYL BOLUS VIA INFUSION
25.0000 ug | INTRAVENOUS | Status: DC | PRN
  Administered 2023-03-22: 50 ug via INTRAVENOUS
  Administered 2023-03-23 (×2): 25 ug via INTRAVENOUS

## 2023-03-21 MED ORDER — ACETAMINOPHEN 650 MG RE SUPP: 650.0000 mg | Freq: Four times a day (QID) | RECTAL | Status: DC | PRN

## 2023-03-21 MED ORDER — ANASTROZOLE 1 MG PO TABS
1.0000 mg | ORAL_TABLET | Freq: Every day | ORAL | Status: DC
  Administered 2023-03-21: 1 mg via ORAL
  Filled 2023-03-21 (×3): qty 1

## 2023-03-21 MED ORDER — MIDAZOLAM HCL 2 MG/2ML IJ SOLN
1.0000 mg | INTRAMUSCULAR | Status: DC | PRN
  Administered 2023-03-21: 2 mg via INTRAVENOUS
  Administered 2023-03-22: 1 mg via INTRAVENOUS
  Administered 2023-03-25: 2 mg via INTRAVENOUS
  Filled 2023-03-21 (×4): qty 2

## 2023-03-21 MED ORDER — SODIUM CHLORIDE 0.9% FLUSH: 10.0000 mL | INTRAVENOUS | Status: DC | PRN

## 2023-03-21 MED ORDER — HYDROMORPHONE HCL 1 MG/ML IJ SOLN
INTRAMUSCULAR | Status: AC
  Filled 2023-03-21: qty 1

## 2023-03-21 MED ORDER — DEXAMETHASONE SODIUM PHOSPHATE 10 MG/ML IJ SOLN
8.0000 mg | Freq: Once | INTRAMUSCULAR | Status: AC
  Administered 2023-03-21: 8 mg via INTRAVENOUS
  Filled 2023-03-21: qty 1

## 2023-03-21 MED ORDER — LACTATED RINGERS IV SOLN: INTRAVENOUS | Status: DC | PRN

## 2023-03-21 MED ORDER — BUPIVACAINE HCL (PF) 0.5 % IJ SOLN
INTRAMUSCULAR | Status: DC | PRN
  Administered 2023-03-21: 30 mL

## 2023-03-21 MED ORDER — ACETAMINOPHEN 325 MG PO TABS: 650.0000 mg | ORAL_TABLET | Freq: Four times a day (QID) | ORAL | Status: DC | PRN

## 2023-03-21 MED ORDER — ENOXAPARIN SODIUM 40 MG/0.4ML IJ SOSY
40.0000 mg | PREFILLED_SYRINGE | INTRAMUSCULAR | Status: DC
  Administered 2023-03-22: 40 mg via SUBCUTANEOUS
  Filled 2023-03-21: qty 0.4

## 2023-03-21 MED ORDER — PROPOFOL 10 MG/ML IV BOLUS
INTRAVENOUS | Status: AC
  Filled 2023-03-21: qty 20

## 2023-03-21 MED ORDER — SODIUM CHLORIDE 0.9% FLUSH
10.0000 mL | Freq: Two times a day (BID) | INTRAVENOUS | Status: DC
  Administered 2023-03-21: 30 mL
  Administered 2023-03-22 – 2023-03-24 (×5): 10 mL

## 2023-03-21 MED ORDER — ONDANSETRON 4 MG PO TBDP: 4.0000 mg | ORAL_TABLET | Freq: Four times a day (QID) | ORAL | Status: DC | PRN

## 2023-03-21 MED ORDER — ASPIRIN 81 MG PO TBEC
81.0000 mg | DELAYED_RELEASE_TABLET | Freq: Every day | ORAL | Status: DC
  Administered 2023-03-21: 81 mg via ORAL
  Filled 2023-03-21: qty 1

## 2023-03-21 MED ORDER — FLEET ENEMA 7-19 GM/118ML RE ENEM
1.0000 | ENEMA | Freq: Once | RECTAL | Status: AC
  Administered 2023-03-21: 1 via RECTAL

## 2023-03-21 MED ORDER — FENTANYL CITRATE (PF) 100 MCG/2ML IJ SOLN
INTRAMUSCULAR | Status: AC
  Filled 2023-03-21: qty 2

## 2023-03-21 MED ORDER — FENTANYL CITRATE PF 50 MCG/ML IJ SOSY
25.0000 ug | PREFILLED_SYRINGE | Freq: Once | INTRAMUSCULAR | Status: AC
  Administered 2023-03-21: 25 ug via INTRAVENOUS
  Filled 2023-03-21: qty 1

## 2023-03-21 MED ORDER — LIDOCAINE HCL (PF) 2 % IJ SOLN
INTRAMUSCULAR | Status: AC
  Filled 2023-03-21: qty 5

## 2023-03-21 MED ORDER — BUPIVACAINE HCL (PF) 0.5 % IJ SOLN
INTRAMUSCULAR | Status: AC
  Filled 2023-03-21: qty 30

## 2023-03-21 MED ORDER — CHLORHEXIDINE GLUCONATE 0.12 % MT SOLN: 15.0000 mL | Freq: Once | OROMUCOSAL | Status: AC

## 2023-03-21 MED ORDER — CHLORHEXIDINE GLUCONATE CLOTH 2 % EX PADS: 6.0000 | MEDICATED_PAD | Freq: Once | CUTANEOUS | Status: DC

## 2023-03-21 MED ORDER — DEXAMETHASONE SODIUM PHOSPHATE 10 MG/ML IJ SOLN
INTRAMUSCULAR | Status: AC
  Filled 2023-03-21: qty 1

## 2023-03-21 MED ORDER — LIDOCAINE HCL (CARDIAC) PF 100 MG/5ML IV SOSY
PREFILLED_SYRINGE | INTRAVENOUS | Status: DC | PRN
  Administered 2023-03-21: 60 mg via INTRATRACHEAL

## 2023-03-21 MED ORDER — ALVIMOPAN 12 MG PO CAPS: 12.0000 mg | ORAL_CAPSULE | ORAL | Status: AC

## 2023-03-21 MED ORDER — PROPOFOL 10 MG/ML IV BOLUS
INTRAVENOUS | Status: DC | PRN
  Administered 2023-03-21: 60 mg via INTRAVENOUS

## 2023-03-21 MED ORDER — POLYETHYLENE GLYCOL 3350 17 G PO PACK
17.0000 g | PACK | Freq: Every day | ORAL | Status: DC
  Administered 2023-03-21: 17 g
  Filled 2023-03-21: qty 1

## 2023-03-21 MED ORDER — PANTOPRAZOLE SODIUM 40 MG IV SOLR
40.0000 mg | Freq: Every day | INTRAVENOUS | Status: DC
  Administered 2023-03-21 – 2023-03-28 (×8): 40 mg via INTRAVENOUS
  Filled 2023-03-21 (×8): qty 10

## 2023-03-21 MED ORDER — ACETAMINOPHEN 650 MG RE SUPP
650.0000 mg | Freq: Four times a day (QID) | RECTAL | Status: DC | PRN
  Administered 2023-03-24 – 2023-03-25 (×3): 650 mg via RECTAL
  Filled 2023-03-21 (×3): qty 1

## 2023-03-21 MED ORDER — SODIUM CHLORIDE 0.9 % IV SOLN: INTRAVENOUS | Status: DC

## 2023-03-21 MED ORDER — ONDANSETRON HCL 4 MG/2ML IJ SOLN: 4.0000 mg | Freq: Four times a day (QID) | INTRAMUSCULAR | Status: DC | PRN

## 2023-03-21 MED ORDER — ROSUVASTATIN CALCIUM 10 MG PO TABS
10.0000 mg | ORAL_TABLET | Freq: Every day | ORAL | Status: DC
  Administered 2023-03-21: 10 mg via ORAL
  Filled 2023-03-21: qty 1

## 2023-03-21 MED ORDER — HEPARIN SODIUM (PORCINE) 5000 UNIT/ML IJ SOLN
5000.0000 [IU] | Freq: Three times a day (TID) | INTRAMUSCULAR | Status: DC
  Administered 2023-03-21: 5000 [IU] via SUBCUTANEOUS
  Filled 2023-03-21: qty 1

## 2023-03-21 MED ORDER — LACTATED RINGERS IV SOLN: INTRAVENOUS | Status: DC

## 2023-03-21 MED ORDER — DOCUSATE SODIUM 50 MG/5ML PO LIQD
100.0000 mg | Freq: Two times a day (BID) | ORAL | Status: DC
  Administered 2023-03-21: 100 mg
  Filled 2023-03-21: qty 10

## 2023-03-21 MED ORDER — ENOXAPARIN SODIUM 30 MG/0.3ML IJ SOSY: 30.0000 mg | PREFILLED_SYRINGE | INTRAMUSCULAR | Status: DC

## 2023-03-21 MED ORDER — FENTANYL 2500MCG IN NS 250ML (10MCG/ML) PREMIX INFUSION
25.0000 ug/h | INTRAVENOUS | Status: DC
  Administered 2023-03-21: 25 ug/h via INTRAVENOUS
  Administered 2023-03-22: 75 ug/h via INTRAVENOUS
  Filled 2023-03-21 (×2): qty 250

## 2023-03-21 MED ORDER — CHLORHEXIDINE GLUCONATE CLOTH 2 % EX PADS
6.0000 | MEDICATED_PAD | Freq: Every day | CUTANEOUS | Status: DC
  Administered 2023-03-21 – 2023-03-29 (×9): 6 via TOPICAL

## 2023-03-21 MED ORDER — ORAL CARE MOUTH RINSE: 15.0000 mL | Freq: Once | OROMUCOSAL | Status: AC

## 2023-03-21 MED ORDER — CHLORHEXIDINE GLUCONATE 0.12 % MT SOLN
OROMUCOSAL | Status: AC
  Administered 2023-03-21: 15 mL via OROMUCOSAL
  Filled 2023-03-21: qty 15

## 2023-03-21 MED ORDER — ROCURONIUM BROMIDE 10 MG/ML (PF) SYRINGE
PREFILLED_SYRINGE | INTRAVENOUS | Status: DC | PRN
  Administered 2023-03-21: 60 mg via INTRAVENOUS
  Administered 2023-03-21: 30 mg via INTRAVENOUS

## 2023-03-21 MED ORDER — FENTANYL CITRATE (PF) 100 MCG/2ML IJ SOLN
INTRAMUSCULAR | Status: DC | PRN
  Administered 2023-03-21 (×4): 50 ug via INTRAVENOUS

## 2023-03-21 MED ORDER — FENTANYL CITRATE (PF) 250 MCG/5ML IJ SOLN
INTRAMUSCULAR | Status: AC
  Filled 2023-03-21: qty 5

## 2023-03-21 MED ORDER — METOPROLOL TARTRATE 25 MG PO TABS: 25.0000 mg | ORAL_TABLET | Freq: Every day | ORAL | Status: DC | PRN

## 2023-03-21 MED ORDER — PHENYLEPHRINE 80 MCG/ML (10ML) SYRINGE FOR IV PUSH (FOR BLOOD PRESSURE SUPPORT)
PREFILLED_SYRINGE | INTRAVENOUS | Status: DC | PRN
  Administered 2023-03-21 (×5): 160 ug via INTRAVENOUS

## 2023-03-21 MED ORDER — ALVIMOPAN 12 MG PO CAPS
ORAL_CAPSULE | ORAL | Status: AC
  Administered 2023-03-21: 12 mg via ORAL
  Filled 2023-03-21: qty 1

## 2023-03-21 MED ORDER — SODIUM CHLORIDE 0.9 % IV BOLUS
500.0000 mL | Freq: Once | INTRAVENOUS | Status: AC
  Administered 2023-03-22: 500 mL via INTRAVENOUS

## 2023-03-21 SURGICAL SUPPLY — 62 items
APL PRP STRL LF DISP 70% ISPRP (MISCELLANEOUS) ×4
APPLIER CLIP 11 MED OPEN (CLIP)
APPLIER CLIP 13 LRG OPEN (CLIP)
APR CLP LRG 13 20 CLIP (CLIP)
APR CLP MED 11 20 MLT OPN (CLIP)
BARRIER SKIN 2 3/4 (OSTOMY) IMPLANT
BARRIER SKIN OD2.25 2 3/4 FLNG (OSTOMY) IMPLANT
BRR SKN FLT 2.75X2.25 2 PC (OSTOMY)
CHLORAPREP W/TINT 26 (MISCELLANEOUS) ×3 IMPLANT
CLAMP POUCH DRAINAGE QUIET (OSTOMY) IMPLANT
CLIP APPLIE 11 MED OPEN (CLIP) IMPLANT
CLIP APPLIE 13 LRG OPEN (CLIP) IMPLANT
CLOTH BEACON ORANGE TIMEOUT ST (SAFETY) ×3 IMPLANT
COVER LIGHT HANDLE STERIS (MISCELLANEOUS) ×6 IMPLANT
DRAPE WARM FLUID 44X44 (DRAPES) ×3 IMPLANT
DRSG OPSITE POSTOP 4X10 (GAUZE/BANDAGES/DRESSINGS) IMPLANT
ELECT BLADE 6 FLAT ULTRCLN (ELECTRODE) IMPLANT
ELECT REM PT RETURN 9FT ADLT (ELECTROSURGICAL) ×2
ELECTRODE REM PT RTRN 9FT ADLT (ELECTROSURGICAL) ×3 IMPLANT
GLOVE BIOGEL PI IND STRL 7.0 (GLOVE) ×6 IMPLANT
GLOVE SURG SS PI 7.5 STRL IVOR (GLOVE) ×6 IMPLANT
GOWN STRL REUS W/TWL LRG LVL3 (GOWN DISPOSABLE) ×9 IMPLANT
HANDLE SUCTION POOLE (INSTRUMENTS) IMPLANT
INST SET MAJOR GENERAL (KITS) ×3 IMPLANT
KIT TURNOVER KIT A (KITS) ×3 IMPLANT
LIGASURE IMPACT 36 18CM CVD LR (INSTRUMENTS) ×3 IMPLANT
MANIFOLD NEPTUNE II (INSTRUMENTS) ×3 IMPLANT
NDL HYPO 18GX1.5 BLUNT FILL (NEEDLE) ×3 IMPLANT
NDL HYPO 21X1.5 SAFETY (NEEDLE) ×3 IMPLANT
NEEDLE HYPO 18GX1.5 BLUNT FILL (NEEDLE) ×2 IMPLANT
NEEDLE HYPO 21X1.5 SAFETY (NEEDLE) ×2 IMPLANT
NS IRRIG 1000ML POUR BTL (IV SOLUTION) ×6 IMPLANT
PACK ABDOMINAL MAJOR (CUSTOM PROCEDURE TRAY) ×3 IMPLANT
PAD ARMBOARD 7.5X6 YLW CONV (MISCELLANEOUS) ×3 IMPLANT
PENCIL SMOKE EVACUATOR (MISCELLANEOUS) ×3 IMPLANT
POUCH OSTOMY 2 3/4  H 3804 (WOUND CARE)
POUCH OSTOMY 2 3/4 H 3804 (WOUND CARE)
POUCH OSTOMY 2 PC DRNBL 2.75 (WOUND CARE) IMPLANT
RELOAD LINEAR CUT PROX 55 BLUE (ENDOMECHANICALS) IMPLANT
RELOAD PROXIMATE 75MM BLUE (ENDOMECHANICALS) IMPLANT
RELOAD STAPLE 55 3.8 BLU REG (ENDOMECHANICALS) IMPLANT
RELOAD STAPLE 75 3.8 BLU REG (ENDOMECHANICALS) IMPLANT
RETRACTOR WND ALEXIS-O 25 LRG (MISCELLANEOUS) IMPLANT
RETRACTOR WOUND ALXS 18CM MED (MISCELLANEOUS) IMPLANT
RTRCTR WOUND ALEXIS O 18CM MED (MISCELLANEOUS) ×2
RTRCTR WOUND ALEXIS O 25CM LRG (MISCELLANEOUS)
SET BASIN LINEN APH (SET/KITS/TRAYS/PACK) ×3 IMPLANT
SPONGE T-LAP 18X18 ~~LOC~~+RFID (SPONGE) ×3 IMPLANT
STAPLER GUN LINEAR PROX 60 (STAPLE) IMPLANT
STAPLER PROXIMATE 55 BLUE (STAPLE) IMPLANT
STAPLER PROXIMATE 75MM BLUE (STAPLE) IMPLANT
STAPLER VISISTAT (STAPLE) ×3 IMPLANT
SUCTION POOLE HANDLE (INSTRUMENTS) ×2
SUT CHROMIC 0 SH (SUTURE) IMPLANT
SUT CHROMIC 2 0 SH (SUTURE) IMPLANT
SUT CHROMIC 3 0 SH 27 (SUTURE) IMPLANT
SUT PDS AB 0 CTX 60 (SUTURE) IMPLANT
SUT SILK 2 0 (SUTURE)
SUT SILK 2-0 18XBRD TIE 12 (SUTURE) IMPLANT
SUT SILK 3 0 SH CR/8 (SUTURE) IMPLANT
SYR 30ML LL (SYRINGE) ×6 IMPLANT
TRAY FOLEY MTR SLVR 16FR STAT (SET/KITS/TRAYS/PACK) ×3 IMPLANT

## 2023-03-21 NOTE — Anesthesia Preprocedure Evaluation (Addendum)
Anesthesia Evaluation  Patient identified by MRN, date of birth, ID band Patient awake    Reviewed: Allergy & Precautions, H&P , NPO status , Patient's Chart, lab work & pertinent test results, reviewed documented beta blocker date and time   Airway Mallampati: II  TM Distance: >3 FB Neck ROM: Full    Dental no notable dental hx. (+) Dental Advisory Given No notable dental injury:   Pulmonary asthma  Left lung cancer/lobectomy   Pulmonary exam normal breath sounds clear to auscultation       Cardiovascular Exercise Tolerance: Good hypertension, Pt. on medications and Pt. on home beta blockers Normal cardiovascular exam+ dysrhythmias Supra Ventricular Tachycardia  Rhythm:Regular Rate:Normal  20-Mar-2023 23:52:31 [facility] ROUTINE RECORD 11-13-1934 (89 yr) Female Black Vent. rate 94 BPM PR interval 148 ms QRS duration 93 ms QT/QTcB 366/458 ms P-R-T axes 3 -10 138 Sinus rhythm Multiple premature complexes, vent & supraven Repol abnrm suggests ischemia, lateral leads Minimal ST elevation, inferior leads   Neuro/Psych  Neuromuscular disease  negative psych ROS   GI/Hepatic Neg liver ROS,,,Colon perforation   Endo/Other  negative endocrine ROS    Renal/GU negative Renal ROS  negative genitourinary   Musculoskeletal  (+) Arthritis , Osteoarthritis,    Abdominal   Peds negative pediatric ROS (+)  Hematology negative hematology ROS (+)   Anesthesia Other Findings Right breast cancer  Reproductive/Obstetrics negative OB ROS                             Anesthesia Physical Anesthesia Plan  ASA: 3 and emergent  Anesthesia Plan: General   Post-op Pain Management: Dilaudid IV   Induction: Intravenous, Rapid sequence and Cricoid pressure planned  PONV Risk Score and Plan: 4 or greater and Ondansetron and Dexamethasone  Airway Management Planned: Oral  ETT  Additional Equipment:   Intra-op Plan:   Post-operative Plan: Extubation in OR and Possible Post-op intubation/ventilation  Informed Consent: I have reviewed the patients History and Physical, chart, labs and discussed the procedure including the risks, benefits and alternatives for the proposed anesthesia with the patient or authorized representative who has indicated his/her understanding and acceptance.     Dental advisory given  Plan Discussed with: CRNA and Surgeon  Anesthesia Plan Comments:         Anesthesia Quick Evaluation

## 2023-03-21 NOTE — Anesthesia Procedure Notes (Signed)
Arterial Line Insertion Start/End4/24/2024 8:03 PM, 03/21/2023 8:10 PM Performed by: Molli Barrows, MD, anesthesiologist  Patient location: OR. Preanesthetic checklist: patient identified, IV checked, site marked, risks and benefits discussed, surgical consent, monitors and equipment checked, pre-op evaluation, timeout performed and anesthesia consent Patient sedated Left, radial was placed Catheter size: 20 G Hand hygiene performed  and maximum sterile barriers used   Attempts: 1 Procedure performed using ultrasound guided technique. Ultrasound Notes:anatomy identified Post procedure assessment: normal  Post procedure complications: unsuccessful attempts (only once attempted, looks like atherosclerotic plaques in the artery).

## 2023-03-21 NOTE — Transfer of Care (Signed)
Immediate Anesthesia Transfer of Care Note  Patient: Miranda [name]  Procedure(s) Performed: EXPLORATORY LAPAROTOMY GASTRORRHAPHY  Patient Location: PACU and ICU  Anesthesia Type:General  Level of Consciousness: Patient remains intubated per anesthesia plan  Airway & Oxygen Therapy: Patient remains intubated per anesthesia plan  Post-op Assessment: Report given to RN and Post -op Vital signs reviewed and stable  Post vital signs: Reviewed and stable  Last Vitals:  Vitals Value Taken Time  BP 167/77 03/21/23 2030  Temp 97.7 2032  Pulse 92 2024  Resp 14 03/21/23 2055  SpO2 100   Vitals shown include unvalidated device data.  Last Pain:  Vitals:   03/21/23 1510  PainSc: 10-Worst pain ever      Patients Stated Pain Goal: 9 (03/21/23 1549)  Complications: No notable events documented.

## 2023-03-21 NOTE — OR Nursing (Signed)
Pain level 8 ,  fentynal given to assist with pain per verbal order Dr. Pilar Plate

## 2023-03-21 NOTE — OR Nursing (Signed)
Patient pain level rated a 5 now ststed it feels much better.

## 2023-03-21 NOTE — OR Nursing (Signed)
Pain easing up rated pain 5. Family at bedside

## 2023-03-21 NOTE — Anesthesia Procedure Notes (Signed)
Procedure Name: Intubation Date/Time: 03/21/2023 6:35 PM  Performed by: Molli Barrows, MDPre-anesthesia Checklist: Patient identified, Emergency Drugs available, Suction available and Patient being monitored Patient Re-evaluated:Patient Re-evaluated prior to induction Oxygen Delivery Method: Circle system utilized Preoxygenation: Pre-oxygenation with 100% oxygen Induction Type: IV induction, Cricoid Pressure applied and Rapid sequence Laryngoscope Size: Mac and 3 Grade View: Grade I Tube type: Oral Tube size: 7.0 mm Number of attempts: 1 Airway Equipment and Method: Stylet and Oral airway Placement Confirmation: ETT inserted through vocal cords under direct vision, positive ETCO2 and breath sounds checked- equal and bilateral Secured at: 20 cm Tube secured with: Tape Dental Injury: Teeth and Oropharynx as per pre-operative assessment

## 2023-03-21 NOTE — Progress Notes (Signed)
Subjective: Patient's pain has returned and is diffuse in nature.  Objective: Vital signs in last 24 hours: Temp:  [97.3 F (36.3 C)-97.6 F (36.4 C)] 97.3 F (36.3 C) (04/24 0523) Pulse Rate:  [50-109] 109 (04/24 0523) Resp:  [16-29] 20 (04/24 0523) BP: (107-163)/(56-94) 107/56 (04/24 0523) SpO2:  [92 %-100 %] 92 % (04/24 0523) Weight:  [64.4 kg-66 kg] 66 kg (04/24 0146)    Intake/Output from previous day: 04/23 0701 - 04/24 0700 In: 790 [P.O.:240; IV Piggyback:550] Out: -  Intake/Output this shift: No intake/output data recorded.  General appearance: alert, cooperative, and mild distress GI: Soft but tender throughout her abdomen.  No significant bowel sounds appreciated.  Lab Results:  Recent Labs    03/20/23 2008 03/21/23 0631  WBC 11.9* 8.8  HGB 14.8 15.8*  HCT 46.8* 49.4*  PLT 256 238   BMET Recent Labs    03/20/23 2008 03/21/23 0452  NA 137 139  K 3.7 4.1  CL 101 106  CO2 25 20*  GLUCOSE 178* 145*  BUN 15 16  CREATININE 1.15* 1.10*  CALCIUM 9.5 9.4   PT/INR No results for input(s): "LABPROT", "INR" in the last 72 hours.  Studies/Results: CT ABDOMEN PELVIS W CONTRAST  Result Date: 03/20/2023 CLINICAL DATA:  Generalized abdominal pain EXAM: CT ABDOMEN AND PELVIS WITH CONTRAST TECHNIQUE: Multidetector CT imaging of the abdomen and pelvis was performed using the standard protocol following bolus administration of intravenous contrast. RADIATION DOSE REDUCTION: This exam was performed according to the departmental dose-optimization program which includes automated exposure control, adjustment of the mA and/or kV according to patient size and/or use of iterative reconstruction technique. CONTRAST:  75mL OMNIPAQUE IOHEXOL 300 MG/ML  SOLN COMPARISON:  03/22/2017 FINDINGS: Lower chest: Trace right pleural effusion. No acute airspace disease. The heart is enlarged, with calcifications of the aortic and mitral valves. No pericardial effusion. Hepatobiliary:  Either adherent 3 mm gallbladder calculus or mural calcification within the gallbladder fundus. No evidence of acute cholecystitis. The liver is unremarkable. Pancreas: Unremarkable. No pancreatic ductal dilatation or surrounding inflammatory changes. Spleen: Normal in size without focal abnormality. Adrenals/Urinary Tract: The kidneys enhance normally. No urinary tract calculi or obstructive uropathy. The adrenals are unremarkable. The bladder is minimally distended, which limits its evaluation. Stomach/Bowel: Pneumoperitoneum is identified, greatest within the right hemiabdomen. Findings are consistent with perforated viscus. Extraluminal gas and fluid track along the right anterior abdomen to the region of the cecum and ascending colon, suspicious for ascending colonic perforation. Surgical consultation is recommended. There is segmental dilation of jejunal loops within the left mid abdomen, measuring up to 3 cm in diameter, favor regional ileus. No evidence of bowel obstruction. Distal colon is decompressed. Moderate retained stool within the rectal vault could reflect fecal impaction. Vascular/Lymphatic: Aortic atherosclerosis. No enlarged abdominal or pelvic lymph nodes. Reproductive: Status post hysterectomy. No adnexal masses. Other: Pneumoperitoneum and free fluid within the abdomen as above, greatest in the anterior right hemiabdomen. No loculated fluid collection or evidence of abscess at this time. Small fat containing right inguinal hernia. Musculoskeletal: No acute or destructive bony lesions. Reconstructed images demonstrate no additional findings. IMPRESSION: 1. Findings compatible with perforated viscus, with free fluid and free intraperitoneal gas tracking within the right anterior hemiabdomen along the cecum and ascending colon, suspicious for colonic perforation. Surgical consultation is recommended. 2. Segmental jejunal dilation within the left mid abdomen, favor regional ileus. 3. Retained  stool within the rectal vault suspicious for fecal impaction. 4. Trace right pleural effusion. 5.   Adherent 3 mm gallbladder calculus versus focal mural calcification. No evidence of acute cholecystitis. 6.  Aortic Atherosclerosis (ICD10-I70.0). Critical Value/emergent results were called by telephone at the time of interpretation on 03/20/2023 at 10:32 pm to provider [facility] ZAMMIT , who verbally acknowledged these results. Electronically Signed   By: Sharlet Salina M.D.   On: 03/20/2023 22:33    Anti-infectives: Anti-infectives (From admission, onward)    Start     Dose/Rate Route Frequency Ordered Stop   03/21/23 0600  piperacillin-tazobactam (ZOSYN) IVPB 3.375 g        3.375 g 12.5 mL/hr over 240 Minutes Intravenous Every 8 hours 03/21/23 0208     03/20/23 2300  piperacillin-tazobactam (ZOSYN) IVPB 3.375 g        3.375 g 100 mL/hr over 30 Minutes Intravenous  Once 03/20/23 2250 03/21/23 0018       Assessment/Plan: Impression: Peritonitis secondary to perforated viscus.  Patient's leukocytosis has resolved.  Given that she is still symptomatic, we will proceed with exploratory laparotomy.  The risks and benefits of the procedure including bleeding, infection, cardiopulmonary difficulties, the possible need for postoperative ICU care, the possibility of a bowel resection, and the possibility of an ostomy were fully explained to the patient, who gave informed consent.  LOS: 0 days    Franky Macho 03/21/2023

## 2023-03-21 NOTE — Interval H&P Note (Signed)
History and Physical Interval Note:  03/21/2023 4:52 PM  Miranda [name]  has presented today for surgery, with the diagnosis of perforated viscus.  The various methods of treatment have been discussed with the patient and family. After consideration of risks, benefits and other options for treatment, the patient has consented to  Procedure(s): EXPLORATORY LAPAROTOMY (N/A) as a surgical intervention.  The patient's history has been reviewed, patient examined, no change in status, stable for surgery.  I have reviewed the patient's chart and labs.  Questions were answered to the patient's satisfaction.     Franky Macho

## 2023-03-21 NOTE — H&P (Signed)
History and Physical    Patient: Miranda [name]:[id number] DOB: 07-13-1934 DOA: 03/20/2023 DOS: the patient was seen and examined on 03/21/2023 PCP: Benita Stabile, MD  Patient coming from: Home  Chief Complaint:  Chief Complaint  Patient presents with   Abdominal Pain   HPI: Miranda [name] is a 89 y.o. female with medical history significant of hypertension, hyperlipidemia who presents to the emergency department due to generalized abdominal pain which started this afternoon around 3 PM, she complained of nausea without vomiting and denies fever, chills, chest pain, shortness of breath, diarrhea.  ED Course:  In the emergency department, she was hemodynamically stable on arrival to the ED with normal vital signs.  Workup in the ED showed CBC with WBC of 11.9, hemoglobin 14.8, hematocrit 46.8, MCV 85.7, platelets 256.  BMP was normal except for blood glucose of 178, and creatinine of 1.15.  Lipase was 78 CT abdomen and pelvis with contrast showed findings compatible with perforated viscus with free fluid and free intraperitoneal gas tracking within the right anterior hemiabdomen along the cecum and ascending colon, suspicious for colonic perforation.  Retained stool within the rectal vault suspicious for fecal impaction.  Review of Systems: Review of systems as noted in the HPI. All other systems reviewed and are negative.   Past Medical History:  Diagnosis Date   Asthma    Cancer    Essential hypertension, benign    History of kidney stones    Mixed hyperlipidemia    Past Surgical History:  Procedure Laterality Date   ABDOMINAL HYSTERECTOMY     excessive bleeding, no cancer    BREAST LUMPECTOMY Right 2020   CARPAL TUNNEL RELEASE Left 09/06/2021   Procedure: CARPAL TUNNEL RELEASE;  Surgeon: Vickki Hearing, MD;  Location: [facility];  Service: Orthopedics;  Laterality: Left;   Left knee arthroscopy due to torn ligament and cartiallege  2006   [facility]    Left lung -  lobectomy lower lobe - lung cancer     RADIOACTIVE SEED GUIDED PARTIAL MASTECTOMY WITH AXILLARY SENTINEL LYMPH NODE BIOPSY Right 06/24/2019   Procedure: RADIOACTIVE SEED GUIDED RIGHT BREAST PARTIAL MASTECTOMY WITH  SENTINEL LYMPH NODE BIOPSY;  Surgeon: Abigail Miyamoto, MD;  Location: [facility];  Service: General;  Laterality: Right;    Social History:  reports that she has never smoked. She has never used smokeless tobacco. She reports current alcohol use. She reports that she does not use drugs.   Allergies  Allergen Reactions   Bee Venom Swelling    Family History  Problem Relation Age of Onset   Colon cancer Father    Heart attack Mother    Anuerysm Mother        Brain    Stroke Sister    Hypertension Sister    Melanoma Sister    Hypertension Sister    Hypertension Son    Cancer Other        Family history    Arthritis Other        Family history      Prior to Admission medications   Medication Sig Start Date End Date Taking? Authorizing Provider  anastrozole (ARIMIDEX) 1 MG tablet Take 1 tablet (1 mg total) by mouth daily. 08/16/22   Serena Croissant, MD  aspirin 81 MG EC tablet Take 81 mg by mouth daily.      [provider]  cetirizine (ZYRTEC) 10 MG tablet Take 10 mg by mouth daily as needed for allergies.  [provider]  fluticasone (FLONASE) 50 MCG/ACT nasal spray Place 1 spray into both nostrils daily as needed for allergies or rhinitis.    [provider]  gabapentin (NEURONTIN) 100 MG capsule TAKE 1 CAPSULE BY MOUTH THREE TIMES A DAY 07/19/22   Vickki Hearing, MD  ibuprofen (ADVIL) 800 MG tablet TAKE 1 TABLET BY MOUTH EVERY 8 HOURS AS NEEDED 04/18/22   Vickki Hearing, MD  KLOR-CON M20 20 MEQ tablet Take 20 mEq by mouth daily.  01/30/17   [provider]  latanoprost (XALATAN) 0.005 % ophthalmic solution Place 1 drop into both eyes at bedtime. 02/20/17   [provider]  metoprolol tartrate (LOPRESSOR) 25 MG tablet Take  25 mg by mouth daily as needed (rapid heart rate).  12/04/17   [provider]  Polyvinyl Alcohol-Povidone (REFRESH OP) Place 1 drop into both eyes daily as needed (dry eyes).    [provider]  rosuvastatin (CRESTOR) 10 MG tablet Take 10 mg by mouth daily. 05/23/21   [provider]  telmisartan-hydrochlorothiazide (MICARDIS HCT) 80-25 MG tablet Take 1 tablet by mouth daily. 11/06/17   [provider]  timolol (TIMOPTIC) 0.5 % ophthalmic solution Place 1 drop into both eyes daily. 05/07/21   [provider]  torsemide (DEMADEX) 20 MG tablet Take 20 mg by mouth daily as needed (swelling).    [provider]    Physical Exam: BP (!) 158/93   Pulse (!) 104   Temp 97.6 F (36.4 C)   Resp 20   Ht 5' (1.524 m)   Wt 66 kg   SpO2 95%   BMI 28.42 kg/m   General: 89 y.o. year-old female well developed well nourished in no acute distress.  Alert and oriented x3. HEENT: NCAT, EOMI Neck: Supple, trachea medial Cardiovascular: Regular rate and rhythm with no rubs or gallops.  No thyromegaly or JVD noted.  No lower extremity edema. 2/4 pulses in all 4 extremities. Respiratory: Clear to auscultation with no wheezes or rales. Good inspiratory effort. Abdomen: Soft, tender to palpation of right side of her abdomen.  Nondistended with normal bowel sounds x4 quadrants. Muskuloskeletal: No cyanosis, clubbing or edema noted bilaterally Neuro: CN II-XII intact, strength 5/5 x 4, sensation, reflexes intact Skin: No ulcerative lesions noted or rashes Psychiatry: Judgement and insight appear normal. Mood is appropriate for condition and setting          Labs on Admission:  Basic Metabolic Panel: Recent Labs  Lab 03/20/23 2008  NA 137  K 3.7  CL 101  CO2 25  GLUCOSE 178*  BUN 15  CREATININE 1.15*  CALCIUM 9.5   Liver Function Tests: Recent Labs  Lab 03/20/23 2008  AST 21  ALT 15  ALKPHOS 53  BILITOT 0.8  PROT 7.2  ALBUMIN 4.1   Recent  Labs  Lab 03/20/23 2008  LIPASE 78*   No results for input(s): "AMMONIA" in the last 168 hours. CBC: Recent Labs  Lab 03/20/23 2008  WBC 11.9*  HGB 14.8  HCT 46.8*  MCV 85.7  PLT 256   Cardiac Enzymes: No results for input(s): "CKTOTAL", "CKMB", "CKMBINDEX", "TROPONINI" in the last 168 hours.  BNP (last 3 results) No results for input(s): "BNP" in the last 8760 hours.  ProBNP (last 3 results) No results for input(s): "PROBNP" in the last 8760 hours.  CBG: No results for input(s): "GLUCAP" in the last 168 hours.  Radiological Exams on Admission: CT ABDOMEN PELVIS W CONTRAST  Result  Date: 03/20/2023 CLINICAL DATA:  Generalized abdominal pain EXAM: CT ABDOMEN AND PELVIS WITH CONTRAST TECHNIQUE: Multidetector CT imaging of the abdomen and pelvis was performed using the standard protocol following bolus administration of intravenous contrast. RADIATION DOSE REDUCTION: This exam was performed according to the departmental dose-optimization program which includes automated exposure control, adjustment of the mA and/or kV according to patient size and/or use of iterative reconstruction technique. CONTRAST:  75mL OMNIPAQUE IOHEXOL 300 MG/ML  SOLN COMPARISON:  03/22/2017 FINDINGS: Lower chest: Trace right pleural effusion. No acute airspace disease. The heart is enlarged, with calcifications of the aortic and mitral valves. No pericardial effusion. Hepatobiliary: Either adherent 3 mm gallbladder calculus or mural calcification within the gallbladder fundus. No evidence of acute cholecystitis. The liver is unremarkable. Pancreas: Unremarkable. No pancreatic ductal dilatation or surrounding inflammatory changes. Spleen: Normal in size without focal abnormality. Adrenals/Urinary Tract: The kidneys enhance normally. No urinary tract calculi or obstructive uropathy. The adrenals are unremarkable. The bladder is minimally distended, which limits its evaluation. Stomach/Bowel: Pneumoperitoneum is  identified, greatest within the right hemiabdomen. Findings are consistent with perforated viscus. Extraluminal gas and fluid track along the right anterior abdomen to the region of the cecum and ascending colon, suspicious for ascending colonic perforation. Surgical consultation is recommended. There is segmental dilation of jejunal loops within the left mid abdomen, measuring up to 3 cm in diameter, favor regional ileus. No evidence of bowel obstruction. Distal colon is decompressed. Moderate retained stool within the rectal vault could reflect fecal impaction. Vascular/Lymphatic: Aortic atherosclerosis. No enlarged abdominal or pelvic lymph nodes. Reproductive: Status post hysterectomy. No adnexal masses. Other: Pneumoperitoneum and free fluid within the abdomen as above, greatest in the anterior right hemiabdomen. No loculated fluid collection or evidence of abscess at this time. Small fat containing right inguinal hernia. Musculoskeletal: No acute or destructive bony lesions. Reconstructed images demonstrate no additional findings. IMPRESSION: 1. Findings compatible with perforated viscus, with free fluid and free intraperitoneal gas tracking within the right anterior hemiabdomen along the cecum and ascending colon, suspicious for colonic perforation. Surgical consultation is recommended. 2. Segmental jejunal dilation within the left mid abdomen, favor regional ileus. 3. Retained stool within the rectal vault suspicious for fecal impaction. 4. Trace right pleural effusion. 5. Adherent 3 mm gallbladder calculus versus focal mural calcification. No evidence of acute cholecystitis. 6.  Aortic Atherosclerosis (ICD10-I70.0). Critical Value/emergent results were called by telephone at the time of interpretation on 03/20/2023 at 10:32 pm to provider [facility] ZAMMIT , who verbally acknowledged these results. Electronically Signed   By: Sharlet Salina M.D.   On: 03/20/2023 22:33    EKG: I independently viewed the EKG  done and my findings are as followed: Normal sinus rhythm at rate of 94 bpm  Assessment/Plan Present on Admission:  Perforated viscus  Essential hypertension, benign  PSVT (paroxysmal supraventricular tachycardia)  Principal Problem:   Perforated viscus Active Problems:   Essential hypertension, benign   PSVT (paroxysmal supraventricular tachycardia)   Perforated abdominal viscus   History of breast cancer   Abdominal pain in the setting of perforated abdominal viscus Continue IV NS at 75 mLs/Hr Continue IV Zosyn  Continue IV Dilaudid 0.5 mg q.4h p.r.n. for moderate to severe pain Continue IV Zofran p.r.n. Continue clear liquid diet with plan to advanced diet as tolerated Obtain blood culture x2 General surgery was already consulted is following up with patient  Essential hypertension Continue Lopressor  Mixed hyperlipidemia Continue Crestor  History of PSVT EKG personally reviewed showed normal  sinus rhythm at a rate of 94 bpm Continue Lopressor  History of breast cancer  Continue anastrozole  DVT prophylaxis: Heparin subcu   Advance Care Planning: Full code  Consults: General surgery  Family Communication: None at bedside  Severity of Illness: The appropriate patient status for this patient is INPATIENT. Inpatient status is judged to be reasonable and necessary in order to provide the required intensity of service to ensure the patient's safety. The patient's presenting symptoms, physical exam findings, and initial radiographic and laboratory data in the context of their chronic comorbidities is felt to place them at high risk for further clinical deterioration. Furthermore, it is not anticipated that the patient will be medically stable for discharge from the hospital within 2 midnights of admission.   * I certify that at the point of admission it is my clinical judgment that the patient will require inpatient hospital care spanning beyond 2 midnights from the  point of admission due to high intensity of service, high risk for further deterioration and high frequency of surveillance required.*  Author: Frankey Shown, DO 03/21/2023 4:37 AM  For on call review [email or web address].

## 2023-03-21 NOTE — Op Note (Signed)
Patient:  Miranda [name]  DOB:  May 14, 1934  MRN:  [id number]   Preop Diagnosis: Perforated viscus, pneumoperitoneum  Postop Diagnosis: Same, perforated peptic ulcer  Procedure: Exploratory laparotomy, [name] plication (gastrorrhaphy), central line placement  Surgeon: Franky Macho, MD  Anes: General Endotracheal  Indications: Patient is an 89 year old black female who presented to the emergency room with worsening umbilical pain.  CT scan of the abdomen revealed pneumoperitoneum with free fluid along the right paracolic gutter and liver.  She now presents for an exploratory laparotomy.  The risks and benefits of the procedure including bleeding, infection, cardiopulmonary difficulties, the possibility of a bowel resection, and the possibility of remaining intubated and recovering in the intensive care unit were fully explained to the patient, who gave informed consent.  Procedure note: The patient was placed in the supine position.  After induction of general endotracheal anesthesia, a right femoral vein triple-lumen was placed using the Seldinger technique without difficulty due to the lack of IV access.  Good back flow of venous blood was noted on aspiration of all 3 ports.  All 3 ports were flushed with saline.  All 3 ports were injected with saline.  It was secured in place with using 3-0 silk suture.  A dry sterile dressing was then applied.  The abdomen was then prepped and draped using the usual sterile technique with ChloraPrep.  Surgical site confirmation was performed.  An upper midline incision was made down to the umbilicus.  The peritoneal cavity was then entered without difficulty.  I immediately found bilious fluid within the peritoneal cavity.  Aerobic and anaerobic cultures were taken and sent to microbiology.  I was able to evacuate the peritoneal fluid and subsequently found an anterior perforated peptic ulcer right at the pylorus.  Once I irrigated out the abdominal cavity, I  proceeded with a Graham plication.  I did attempt primary closure but the suture would not hold.  It was approximately three quarters of a centimeter in its greatest diameter.  An omental flap was formed using the LigaSure and was secured in place over the perforation using 3-0 silk sutures.  An NG tube had already been placed and was in appropriate position in the stomach.  I then ran the small bowel and colon and no other injuries were noted.  I again irrigated copiously with normal saline until the effluent was clear.  #10 flat Jackson-Pratt drain was placed over the repair and brought through separate stab wound to the right of the midline.  It was secured in place using a 3-0 nylon suture.  The fascia was reapproximated using looped 0 PDS running suture.  The subcutaneous layer was irrigated with normal saline.  The wound was injected with 0.5% Sensorcaine.  The skin was closed using staples.  Betadine ointment and dry sterile dressing were applied.  All tape and needle counts were correct at the end of the procedure.  The patient was left intubated and sent to the intensive care unit in guarded but stable condition.  Complications: None  EBL: 25 cc  Specimen: Cultures of peritoneal cavity

## 2023-03-21 NOTE — Progress Notes (Signed)
Patient seen and examined; admitted after midnight secondary to abdominal pain with associated nausea/vomiting.  There is no been any fever, chest pain, shortness of breath, diarrhea, palpitations or any other complaints.  Workup in the ED demonstrated elevated WBCs CT abdomen and pelvis compatible with perforated viscus with free fluid and free intraperitoneal gas tracking within the right anterior hemiabdomen along the cecum and ascending colon; workup suggestive of most likely diverticulitis process with viscus perforation.  Please refer to H&P written by Dr. Thomes Dinning on 03/21/2023 for further eval/details on admission.  Plan: -ok for patient to receive CLD and oral meds. -Continue IV fluids, electrolyte repletion, IV antibiotics and as needed analgesics/antiemetics. -General Surgery has been consulted and will follow recommendations.  Vassie Loll MD [phone]

## 2023-03-21 NOTE — Progress Notes (Signed)
  Transition of Care [facility]) Screening Note   Patient Details  Name: Miranda [name] Date of Birth: April 30, 1934   Transition of [facility]) CM/SW Contact:    Annice Needy, LCSW Phone Number: 03/21/2023, 11:25 AM    Transition of Care Department [facility]) has reviewed patient and no TOC needs have been identified at this time. We will continue to monitor patient advancement through interdisciplinary progression rounds. If new patient transition needs arise, please place a TOC consult.

## 2023-03-21 NOTE — Procedures (Signed)
Patient came back from PACU still intubated from surgery.  Placed patient on ventilator at 20:23.  Patient's tube at 20 at lip.  BS are clear and diminished.  Settings on ventilator are 360/R14/+5 and currently at 100%.  Will get ABG and wean FIO2 after results.

## 2023-03-21 NOTE — H&P (View-Only) (Signed)
Subjective: Patient's pain has returned and is diffuse in nature.  Objective: Vital signs in last 24 hours: Temp:  [97.3 F (36.3 C)-97.6 F (36.4 C)] 97.3 F (36.3 C) (04/24 0523) Pulse Rate:  [50-109] 109 (04/24 0523) Resp:  [16-29] 20 (04/24 0523) BP: (107-163)/(56-94) 107/56 (04/24 0523) SpO2:  [92 %-100 %] 92 % (04/24 0523) Weight:  [64.4 kg-66 kg] 66 kg (04/24 0146)    Intake/Output from previous day: 04/23 0701 - 04/24 0700 In: 790 [P.O.:240; IV Piggyback:550] Out: -  Intake/Output this shift: No intake/output data recorded.  General appearance: alert, cooperative, and mild distress GI: Soft but tender throughout her abdomen.  No significant bowel sounds appreciated.  Lab Results:  Recent Labs    03/20/23 2008 03/21/23 0631  WBC 11.9* 8.8  HGB 14.8 15.8*  HCT 46.8* 49.4*  PLT 256 238   BMET Recent Labs    03/20/23 2008 03/21/23 0452  NA 137 139  K 3.7 4.1  CL 101 106  CO2 25 20*  GLUCOSE 178* 145*  BUN 15 16  CREATININE 1.15* 1.10*  CALCIUM 9.5 9.4   PT/INR No results for input(s): "LABPROT", "INR" in the last 72 hours.  Studies/Results: CT ABDOMEN PELVIS W CONTRAST  Result Date: 03/20/2023 CLINICAL DATA:  Generalized abdominal pain EXAM: CT ABDOMEN AND PELVIS WITH CONTRAST TECHNIQUE: Multidetector CT imaging of the abdomen and pelvis was performed using the standard protocol following bolus administration of intravenous contrast. RADIATION DOSE REDUCTION: This exam was performed according to the departmental dose-optimization program which includes automated exposure control, adjustment of the mA and/or kV according to patient size and/or use of iterative reconstruction technique. CONTRAST:  75mL OMNIPAQUE IOHEXOL 300 MG/ML  SOLN COMPARISON:  03/22/2017 FINDINGS: Lower chest: Trace right pleural effusion. No acute airspace disease. The heart is enlarged, with calcifications of the aortic and mitral valves. No pericardial effusion. Hepatobiliary:  Either adherent 3 mm gallbladder calculus or mural calcification within the gallbladder fundus. No evidence of acute cholecystitis. The liver is unremarkable. Pancreas: Unremarkable. No pancreatic ductal dilatation or surrounding inflammatory changes. Spleen: Normal in size without focal abnormality. Adrenals/Urinary Tract: The kidneys enhance normally. No urinary tract calculi or obstructive uropathy. The adrenals are unremarkable. The bladder is minimally distended, which limits its evaluation. Stomach/Bowel: Pneumoperitoneum is identified, greatest within the right hemiabdomen. Findings are consistent with perforated viscus. Extraluminal gas and fluid track along the right anterior abdomen to the region of the cecum and ascending colon, suspicious for ascending colonic perforation. Surgical consultation is recommended. There is segmental dilation of jejunal loops within the left mid abdomen, measuring up to 3 cm in diameter, favor regional ileus. No evidence of bowel obstruction. Distal colon is decompressed. Moderate retained stool within the rectal vault could reflect fecal impaction. Vascular/Lymphatic: Aortic atherosclerosis. No enlarged abdominal or pelvic lymph nodes. Reproductive: Status post hysterectomy. No adnexal masses. Other: Pneumoperitoneum and free fluid within the abdomen as above, greatest in the anterior right hemiabdomen. No loculated fluid collection or evidence of abscess at this time. Small fat containing right inguinal hernia. Musculoskeletal: No acute or destructive bony lesions. Reconstructed images demonstrate no additional findings. IMPRESSION: 1. Findings compatible with perforated viscus, with free fluid and free intraperitoneal gas tracking within the right anterior hemiabdomen along the cecum and ascending colon, suspicious for colonic perforation. Surgical consultation is recommended. 2. Segmental jejunal dilation within the left mid abdomen, favor regional ileus. 3. Retained  stool within the rectal vault suspicious for fecal impaction. 4. Trace right pleural effusion. 5.  Adherent 3 mm gallbladder calculus versus focal mural calcification. No evidence of acute cholecystitis. 6.  Aortic Atherosclerosis (ICD10-I70.0). Critical Value/emergent results were called by telephone at the time of interpretation on 03/20/2023 at 10:32 pm to provider [facility] ZAMMIT , who verbally acknowledged these results. Electronically Signed   By: Sharlet Salina M.D.   On: 03/20/2023 22:33    Anti-infectives: Anti-infectives (From admission, onward)    Start     Dose/Rate Route Frequency Ordered Stop   03/21/23 0600  piperacillin-tazobactam (ZOSYN) IVPB 3.375 g        3.375 g 12.5 mL/hr over 240 Minutes Intravenous Every 8 hours 03/21/23 0208     03/20/23 2300  piperacillin-tazobactam (ZOSYN) IVPB 3.375 g        3.375 g 100 mL/hr over 30 Minutes Intravenous  Once 03/20/23 2250 03/21/23 0018       Assessment/Plan: Impression: Peritonitis secondary to perforated viscus.  Patient's leukocytosis has resolved.  Given that she is still symptomatic, we will proceed with exploratory laparotomy.  The risks and benefits of the procedure including bleeding, infection, cardiopulmonary difficulties, the possible need for postoperative ICU care, the possibility of a bowel resection, and the possibility of an ostomy were fully explained to the patient, who gave informed consent.  LOS: 0 days    Franky Macho 03/21/2023

## 2023-03-21 NOTE — Progress Notes (Signed)
Pharmacy Antibiotic Note  Miranda [name] is a 89 y.o. female admitted on 03/20/2023 with  perforated viscus .  Pharmacy has been consulted for Zosyn dosing. WBC 11.9. Scr 1.15. Surgery following.   Plan: Zosyn 3.375G IV q8h to be infused over 4 hours Trend WBC, temp, renal function  F/U infectious work-up   Height: 5' (152.4 cm) Weight: (!) 145.6 kg (320 lb 15.8 oz) IBW/kg (Calculated) : 45.5  Temp (24hrs), Avg:97.6 F (36.4 C), Min:97.6 F (36.4 C), Max:97.6 F (36.4 C)  Recent Labs  Lab 03/20/23 2008  WBC 11.9*  CREATININE 1.15*    Estimated Creatinine Clearance: 46.5 mL/min (A) (by C-G formula based on SCr of 1.15 mg/dL (H)).    Allergies  Allergen Reactions   Bee Venom Swelling   Abran Duke, PharmD, BCPS Clinical Pharmacist Phone: [phone]

## 2023-03-21 NOTE — Progress Notes (Signed)
Mobility Specialist Progress Note:    03/21/23 1000  Mobility  Activity Transferred to/from [facility];Ambulated with assistance to bathroom  Level of Assistance Modified independent, requires aide device or extra time  Assistive Device Other (Comment) (IV pole)  Distance Ambulated (ft) 20 ft  Activity Response Tolerated well  Mobility Referral Yes  $Mobility charge 1 Mobility   Pt requested assistance to bathroom. Pt used ModI with IV pole to ambulate in room. Tolerated well, c/o abdomen pain (12/10). Returned pt to bed, family in room, all needs met.   Feliciana Rossetti Mobility Specialist Please contact via Special educational needs teacher or  Rehab office at [phone]

## 2023-03-22 DIAGNOSIS — Z853 Personal history of malignant neoplasm of breast: Secondary | ICD-10-CM | POA: Diagnosis not present

## 2023-03-22 DIAGNOSIS — K275 Chronic or unspecified peptic ulcer, site unspecified, with perforation: Secondary | ICD-10-CM | POA: Diagnosis not present

## 2023-03-22 DIAGNOSIS — R198 Other specified symptoms and signs involving the digestive system and abdomen: Secondary | ICD-10-CM | POA: Diagnosis not present

## 2023-03-22 DIAGNOSIS — E861 Hypovolemia: Secondary | ICD-10-CM | POA: Diagnosis not present

## 2023-03-22 LAB — BASIC METABOLIC PANEL
Anion gap: 11 (ref 5–15)
BUN: 25 mg/dL — ABNORMAL HIGH (ref 8–23)
CO2: 20 mmol/L — ABNORMAL LOW (ref 22–32)
Calcium: 7.2 mg/dL — ABNORMAL LOW (ref 8.9–10.3)
Chloride: 110 mmol/L (ref 98–111)
Creatinine, Ser: 1.59 mg/dL — ABNORMAL HIGH (ref 0.44–1.00)
GFR, Estimated: 31 mL/min — ABNORMAL LOW (ref >60)
Glucose, Bld: 128 mg/dL — ABNORMAL HIGH (ref 70–99)
Potassium: 4.1 mmol/L (ref 3.5–5.1)
Sodium: 141 mmol/L (ref 135–145)

## 2023-03-22 LAB — GLUCOSE, CAPILLARY
Glucose-Capillary: 113 mg/dL — ABNORMAL HIGH (ref 70–99)
Glucose-Capillary: 118 mg/dL — ABNORMAL HIGH (ref 70–99)
Glucose-Capillary: 79 mg/dL (ref 70–99)
Glucose-Capillary: 85 mg/dL (ref 70–99)
Glucose-Capillary: 94 mg/dL (ref 70–99)
Glucose-Capillary: 94 mg/dL (ref 70–99)
Glucose-Capillary: 99 mg/dL (ref 70–99)

## 2023-03-22 LAB — CBC
HCT: 40.8 % (ref 36.0–46.0)
Hemoglobin: 13.2 g/dL (ref 12.0–15.0)
MCH: 27.7 pg (ref 26.0–34.0)
MCHC: 32.4 g/dL (ref 30.0–36.0)
MCV: 85.7 fL (ref 80.0–100.0)
Platelets: 185 10*3/uL (ref 150–400)
RBC: 4.76 MIL/uL (ref 3.87–5.11)
RDW: 15 % (ref 11.5–15.5)
WBC: 10.5 10*3/uL (ref 4.0–10.5)
nRBC: 0 % (ref 0.0–0.2)

## 2023-03-22 LAB — CULTURE, BLOOD (ROUTINE X 2): Special Requests: ADEQUATE

## 2023-03-22 LAB — PHOSPHORUS: Phosphorus: 8 mg/dL — ABNORMAL HIGH (ref 2.5–4.6)

## 2023-03-22 LAB — AEROBIC/ANAEROBIC CULTURE W GRAM STAIN (SURGICAL/DEEP WOUND)

## 2023-03-22 LAB — MAGNESIUM: Magnesium: 1.5 mg/dL — ABNORMAL LOW (ref 1.7–2.4)

## 2023-03-22 MED ORDER — SODIUM CHLORIDE 0.9 % IV BOLUS
1000.0000 mL | Freq: Once | INTRAVENOUS | Status: AC
  Administered 2023-03-22: 1000 mL via INTRAVENOUS

## 2023-03-22 MED ORDER — BISACODYL 10 MG RE SUPP
10.0000 mg | Freq: Every day | RECTAL | Status: DC
  Administered 2023-03-22 – 2023-03-26 (×5): 10 mg via RECTAL
  Filled 2023-03-22 (×6): qty 1

## 2023-03-22 MED ORDER — ORAL CARE MOUTH RINSE: 15.0000 mL | OROMUCOSAL | Status: DC | PRN

## 2023-03-22 MED ORDER — LIP MEDEX EX OINT
1.0000 | TOPICAL_OINTMENT | CUTANEOUS | Status: DC | PRN
  Administered 2023-03-25: 1 via TOPICAL

## 2023-03-22 MED ORDER — ORAL CARE MOUTH RINSE
15.0000 mL | OROMUCOSAL | Status: DC
  Administered 2023-03-22 – 2023-03-23 (×12): 15 mL via OROMUCOSAL

## 2023-03-22 MED ORDER — ENOXAPARIN SODIUM 30 MG/0.3ML IJ SOSY
30.0000 mg | PREFILLED_SYRINGE | INTRAMUSCULAR | Status: DC
  Administered 2023-03-23: 30 mg via SUBCUTANEOUS
  Filled 2023-03-22 (×2): qty 0.3

## 2023-03-22 MED ORDER — METOPROLOL TARTRATE 5 MG/5ML IV SOLN
2.5000 mg | Freq: Two times a day (BID) | INTRAVENOUS | Status: DC
  Administered 2023-03-22 – 2023-03-23 (×3): 2.5 mg via INTRAVENOUS
  Filled 2023-03-22 (×4): qty 5

## 2023-03-22 MED ORDER — MAGNESIUM SULFATE 2 GM/50ML IV SOLN
2.0000 g | Freq: Once | INTRAVENOUS | Status: AC
  Administered 2023-03-22: 2 g via INTRAVENOUS
  Filled 2023-03-22: qty 50

## 2023-03-22 NOTE — Progress Notes (Signed)
Progress Note   Patient: Miranda [name] ZOX:[id number] DOB: 1934/10/11 DOA: 03/20/2023     1 DOS: the patient was seen and examined on 03/22/2023   Brief hospital course: As per H&P written by Dr. Thomes Dinning on 03/21/2023. AASTHA DAYLEY is a 89 y.o. female with medical history significant of hypertension, hyperlipidemia who presents to the emergency department due to generalized abdominal pain which started this afternoon around 3 PM, she complained of nausea without vomiting and denies fever, chills, chest pain, shortness of breath, diarrhea.   ED Course:  In the emergency department, she was hemodynamically stable on arrival to the ED with normal vital signs.  Workup in the ED showed CBC with WBC of 11.9, hemoglobin 14.8, hematocrit 46.8, MCV 85.7, platelets 256.  BMP was normal except for blood glucose of 178, and creatinine of 1.15.  Lipase was 78 CT abdomen and pelvis with contrast showed findings compatible with perforated viscus with free fluid and free intraperitoneal gas tracking within the right anterior hemiabdomen along the cecum and ascending colon, suspicious for colonic perforation.  Retained stool within the rectal vault suspicious for fecal impaction.  Assessment and Plan: 1-abdominal pain in the setting of perforated peptic ulcer -Status post surgical surgical intervention with exploratory laparotomy and gastrorrhaphy. -Continue IV PPI and n.p.o. status -Will follow recommendations by general surgery regarding postoperative care.  2-respiratory failure/ventilatory support -Patient Under ventilatory support after surgery -Following commands during sedation holiday -Completed good weaning trial for over 2 hours; aborted due to elevated heart rate and tachypnea. -Minimal support: -Continue fentanyl for sedation -Attempt weaning trial and potential extubation on 03/23/2023.  3-essential hypertension/PSVT -Continue treatment with metoprolol -Continue to follow heart rate and  vital signs.  4-history of breast cancer -Planning to resume the use of anastrozole when able to tolerate by mouth.  5-hyperlipidemia -Planning to resume the use of Crestor when tolerating p.o.'s.  6-GERD -Continue IV PPI.  7-acute kidney injury on chronic renal failure -Patient with a stage IIIb at baseline -Acute distress, surgery and transient hypotension most likely participating in acute kidney injury component. -Minimize nephrotoxic agent -Maintain adequate hydration -Follow renal function trend.  8-hyperphosphatemia/hypomagnesemia -Replete electrolytes and follow trend -Continue to maintain adequate hydration.  Subjective:  Sedated, intubated and mechanically ventilated.  No fever, no major distress.  Physical Exam: Vitals:   03/22/23 1403 03/22/23 1404 03/22/23 1430 03/22/23 1445  BP: (!) 146/62  (!) 115/41 (!) 115/41  Pulse:    92  Resp: Temp:      TempSrc:      SpO2:  98% 100% 100%  Weight:      Height:       General exam: Patient demonstrated to be oriented x 3 and able to communicate adequately during sedation holiday.  Has remained intubated and mechanically ventilated. Respiratory system: Good air movement bilaterally; no using accessory muscles. Cardiovascular system: Sinus tachycardia, no rubs, no gallops, no JVD appreciated on exam.  Soft murmur heard on auscultation. Gastrointestinal system: Abdomen is Soft, well-healing incision appreciated without signs of superimposed infection.  JP drain-demonstrating serosanguineous discharge.  No bilious drainage noted.  No bowel sounds appreciated. Central nervous system: Unable to properly assess at time of evaluation as patient sedated and intubated; during sedation holiday following commands appropriately and communicating.  No focal deficit. Extremities: No cyanosis, clubbing or edema. Skin: No petechiae. Psychiatry: No agitation seen.  Currently sedated and intubated.  Data Reviewed: Basic  metabolic panel: Sodium 141, potassium 4.1, chloride  110, bicarb 20, BUN 25, creatinine 1.59, GFR 31 Magnesium 1.5 Phosphorus 8.0  Family Communication: Son at bedside.  Disposition: Status is: Inpatient Remains inpatient appropriate because: Receiving ventilatory support s/p surgical intervention due to perforated viscus from perforated peptic ulcer disease.   Planned Discharge Destination: Hopefully Home; will follow response and determine the need for any rehabilitation after discharge.   Time spent: 50 minutes  Author: Vassie Loll, MD 03/22/2023 3:01 PM1 surgical resection - with perforation -Status Abdominal painpost diverticulitis-abdominal pain in the setting ofFor on call review [email or web address].

## 2023-03-22 NOTE — Progress Notes (Signed)
Patient sat was going in and out on monitor, placed probe on ear and sat is now 100% instead of 93 as before.

## 2023-03-22 NOTE — Progress Notes (Signed)
Initial Nutrition Assessment  DOCUMENTATION CODES:      INTERVENTION:  Follow up patient progress on Friday   NUTRITION DIAGNOSIS:   Inadequate oral intake related to inability to eat as evidenced by NPO status.  GOAL:  Patient will meet greater than or equal to 90% of their needs  MONITOR:  Vent status, I & O's  REASON FOR ASSESSMENT:   Ventilator    ASSESSMENT: Patient is an 89 yo female with hx of hypertension, asthma, lung cancer, right breast lumpectomy (2020).   4/24 Procedure- exploratory laparotomy, Graham plication and central line placed.  Patient is currently intubated on ventilator support 4/24 @ 2023. NGT in stomach. Discussed patient with nursing. Tube is not to be used per MD. Attempted wean today but returned to full support.   MV: 6.5  L/min  Temp (24hrs), Avg:98 F (36.7 C), Min:97.5 F (36.4 C), Max:98.4 F (36.9 C)  Fentanyl @ 10 ml/hr  Medications and weights reviewed.     Latest Ref Rng & Units 03/22/2023    5:00 AM 03/21/2023    4:52 AM 03/20/2023    8:08 PM  BMP  Glucose 70 - 99 mg/dL 161  096  045   BUN 8 - 23 mg/dL Creatinine 0.44 - 1.00 mg/dL 4.09  8.11  9.14   Sodium 135 - 145 mmol/L 141  139  137   Potassium 3.5 - 5.1 mmol/L 4.1  4.1  3.7   Chloride 98 - 111 mmol/L 110  106  101   CO2 22 - 32 mmol/L Calcium 8.9 - 10.3 mg/dL 7.2  9.4  9.5       NUTRITION - FOCUSED PHYSICAL EXAM: NFPE conducted findings are mild orbital fat depletion, mild temporal muscle depletion and no edema.    Diet Order:   Diet Order             Diet NPO time specified  Diet effective now                   EDUCATION NEEDS:  Not appropriate for education at this time  Skin:  Skin Assessment: Skin Integrity Issues: Skin Integrity Issues:: Incisions Incisions: abdomen  Last BM:  4/25 abdominal distention, constipation per nursing  Height:   Ht Readings from Last 1 Encounters:  03/21/23 5' (1.524 m)    Weight:    Wt Readings from Last 1 Encounters:  03/22/23 66 kg    Ideal Body Weight:   45 kg  BMI:  Body mass index is 28.42 kg/m.  Estimated Nutritional Needs:   Kcal:  1600-1700  Protein:  80-88 gr  Fluid:  >1500 ml/ day  Royann Shivers MS,RD,CSG,LDN Contact: AMION

## 2023-03-22 NOTE — Progress Notes (Signed)
Patient meets SBT criteria once sedation has been decreased.

## 2023-03-22 NOTE — Progress Notes (Signed)
Patient weaned 2 hours on CPAP/PS 40% started to trigger low RR alarm when deeply asleep.  Sedation lightened, however patient became more agitated with increased HR and RR. Placed back on full support.  Patient was following commands and doing well until that point with spontaneous VT 460, RR 22, SBI 32 and stable vitals.

## 2023-03-22 NOTE — Progress Notes (Signed)
Patient titrated down this morning for wake up assessment, patient was alert and follow commands even with fentanyl at the . Patient weened on ventilator but became agitated/restless and heart rate jumped up in to the 130's-140's a couple hours later.  Respiratory called and weening stopped and sedation medication given. Patient is able to write on paper what she needed. And continues to be easily arousable to voice stimuli. Urine output so far since 7am is . Dr Lovell Sheehan and Dr Gwenlyn Perking made aware. Bolus ordered and being given.

## 2023-03-22 NOTE — Progress Notes (Signed)
1 Day Post-Op  Subjective: Intubated  Objective: Vital signs in last 24 hours: Temp:  [97.5 F (36.4 C)-98.4 F (36.9 C)] 98.4 F (36.9 C) (04/25 0800) Pulse Rate:  [84-103] 103 (04/25 0800) Resp:  [14-29] 18 (04/25 0800) BP: (98-165)/(42-87) 123/73 (04/25 0800) SpO2:  [86 %-100 %] 100 % (04/25 0812) FiO2 (%):  [40 %-100 %] 40 % (04/25 0812) Weight:  [66 kg] 66 kg (04/25 0500)    Intake/Output from previous day: 04/24 0701 - 04/25 0700 In: 4988.9 [P.O.:120; I.V.:4744.2; IV Piggyback:124.7] Out: 680 [Urine:320; Drains:140; Blood:20] Intake/Output this shift: No intake/output data recorded.  General appearance: Intubated, sedated Resp: clear to auscultation bilaterally Cardio: regular rate and rhythm, S1, S2 normal, no murmur, click, rub or gallop GI: Soft, incision healing well.  JP drainage serosanguineous in nature.  No bilious drainage noted.  Lab Results:  Recent Labs    03/21/23 0631 03/22/23 0500  WBC 8.8 10.5  HGB 15.8* 13.2  HCT 49.4* 40.8  PLT 238 185   BMET Recent Labs    03/21/23 0452 03/22/23 0500  NA 139 141  K 4.1 4.1  CL 106 110  CO2 20* 20*  GLUCOSE 145* 128*  BUN 16 25*  CREATININE 1.10* 1.59*  CALCIUM 9.4 7.2*   PT/INR No results for input(s): "LABPROT", "INR" in the last 72 hours.  Studies/Results: DG CHEST PORT 1 VIEW  Result Date: 03/21/2023 CLINICAL DATA:  Nasogastric tube placement. Status post exploratory laparotomy for perforated viscus. EXAM: PORTABLE CHEST 1 VIEW COMPARISON:  11/29/2018. FINDINGS: The heart is enlarged and the mediastinal contour is stable. There is atherosclerotic calcification of the aorta. Surgical clips are present at the left hilum. Opacities are noted in the mid to lower right lung and left lung base. There are small bilateral pleural effusions. No pneumothorax. The endotracheal tube terminates 4.1 cm above the carina. An enteric tube courses over the left upper quadrant and out of the field of view.  IMPRESSION: 1. Airspace disease in the mid to lower right lung and left lung base, possible atelectasis, edema, or infiltrate. 2. Small bilateral pleural effusions. 3. Support apparatus as described above. Electronically Signed   By: Thornell Sartorius M.D.   On: 03/21/2023 21:12   CT ABDOMEN PELVIS W CONTRAST  Result Date: 03/20/2023 CLINICAL DATA:  Generalized abdominal pain EXAM: CT ABDOMEN AND PELVIS WITH CONTRAST TECHNIQUE: Multidetector CT imaging of the abdomen and pelvis was performed using the standard protocol following bolus administration of intravenous contrast. RADIATION DOSE REDUCTION: This exam was performed according to the departmental dose-optimization program which includes automated exposure control, adjustment of the mA and/or kV according to patient size and/or use of iterative reconstruction technique. CONTRAST:  75mL OMNIPAQUE IOHEXOL 300 MG/ML  SOLN COMPARISON:  03/22/2017 FINDINGS: Lower chest: Trace right pleural effusion. No acute airspace disease. The heart is enlarged, with calcifications of the aortic and mitral valves. No pericardial effusion. Hepatobiliary: Either adherent 3 mm gallbladder calculus or mural calcification within the gallbladder fundus. No evidence of acute cholecystitis. The liver is unremarkable. Pancreas: Unremarkable. No pancreatic ductal dilatation or surrounding inflammatory changes. Spleen: Normal in size without focal abnormality. Adrenals/Urinary Tract: The kidneys enhance normally. No urinary tract calculi or obstructive uropathy. The adrenals are unremarkable. The bladder is minimally distended, which limits its evaluation. Stomach/Bowel: Pneumoperitoneum is identified, greatest within the right hemiabdomen. Findings are consistent with perforated viscus. Extraluminal gas and fluid track along the right anterior abdomen to the region of the cecum and ascending colon, suspicious  for ascending colonic perforation. Surgical consultation is recommended. There is  segmental dilation of jejunal loops within the left mid abdomen, measuring up to 3 cm in diameter, favor regional ileus. No evidence of bowel obstruction. Distal colon is decompressed. Moderate retained stool within the rectal vault could reflect fecal impaction. Vascular/Lymphatic: Aortic atherosclerosis. No enlarged abdominal or pelvic lymph nodes. Reproductive: Status post hysterectomy. No adnexal masses. Other: Pneumoperitoneum and free fluid within the abdomen as above, greatest in the anterior right hemiabdomen. No loculated fluid collection or evidence of abscess at this time. Small fat containing right inguinal hernia. Musculoskeletal: No acute or destructive bony lesions. Reconstructed images demonstrate no additional findings. IMPRESSION: 1. Findings compatible with perforated viscus, with free fluid and free intraperitoneal gas tracking within the right anterior hemiabdomen along the cecum and ascending colon, suspicious for colonic perforation. Surgical consultation is recommended. 2. Segmental jejunal dilation within the left mid abdomen, favor regional ileus. 3. Retained stool within the rectal vault suspicious for fecal impaction. 4. Trace right pleural effusion. 5. Adherent 3 mm gallbladder calculus versus focal mural calcification. No evidence of acute cholecystitis. 6.  Aortic Atherosclerosis (ICD10-I70.0). Critical Value/emergent results were called by telephone at the time of interpretation on 03/20/2023 at 10:32 pm to provider [facility] ZAMMIT , who verbally acknowledged these results. Electronically Signed   By: Sharlet Salina M.D.   On: 03/20/2023 22:33    Anti-infectives: Anti-infectives (From admission, onward)    Start     Dose/Rate Route Frequency Ordered Stop   03/21/23 0600  piperacillin-tazobactam (ZOSYN) IVPB 3.375 g        3.375 g 12.5 mL/hr over 240 Minutes Intravenous Every 8 hours 03/21/23 0208     03/20/23 2300  piperacillin-tazobactam (ZOSYN) IVPB 3.375 g        3.375  g 100 mL/hr over 30 Minutes Intravenous  Once 03/20/23 2250 03/21/23 0018       Assessment/Plan: s/p Procedure(s): EXPLORATORY LAPAROTOMY GASTRORRHAPHY Impression: Stable on postoperative day 1.  Is off Levophed.  Blood pressure stable.  Still needs fluid resuscitation due to third spacing.  Will attempt to wean off ventilator.  Continue IV Zosyn.  Nothing per NG tube.  LOS: 1 day    Franky Macho 03/22/2023

## 2023-03-22 NOTE — Anesthesia Postprocedure Evaluation (Addendum)
Anesthesia Post Note  Patient: DELFINA SCHREURS  Procedure(s) Performed: EXPLORATORY LAPAROTOMY GASTRORRHAPHY (Abdomen)  Patient location during evaluation: ICU Anesthesia Type: General Level of consciousness: sedated, patient cooperative, awake and patient remains intubated per anesthesia plan Pain management: pain level controlled Vital Signs Assessment: post-procedure vital signs reviewed and stable Respiratory status: patient remains intubated per anesthesia plan and respiratory function unstable Cardiovascular status: blood pressure returned to baseline and stable Postop Assessment: no apparent nausea or vomiting Anesthetic complications: no  No notable events documented.   Last Vitals:  Vitals:   03/22/23 1639 03/22/23 1700  BP: (!) 127/44 132/73  Pulse: 87 (!) 107  Resp: 18 17  Temp:    SpO2: 93% 96%    Last Pain:  Vitals:   03/22/23 1615  TempSrc: Axillary  PainSc:                  Arlis Everly C Grazia Taffe

## 2023-03-22 NOTE — Progress Notes (Signed)
Checked with Dr Lovell Sheehan and Dr Gwenlyn Perking about possible picc order so femoral line could come out. Dr Lovell Sheehan replied "we will do it soon."

## 2023-03-23 ENCOUNTER — Encounter (HOSPITAL_COMMUNITY): Payer: Self-pay | Admitting: General Surgery

## 2023-03-23 ENCOUNTER — Inpatient Hospital Stay (HOSPITAL_COMMUNITY): Payer: Medicare Other

## 2023-03-23 DIAGNOSIS — R198 Other specified symptoms and signs involving the digestive system and abdomen: Secondary | ICD-10-CM | POA: Diagnosis not present

## 2023-03-23 DIAGNOSIS — E861 Hypovolemia: Secondary | ICD-10-CM | POA: Diagnosis not present

## 2023-03-23 DIAGNOSIS — Z853 Personal history of malignant neoplasm of breast: Secondary | ICD-10-CM | POA: Diagnosis not present

## 2023-03-23 DIAGNOSIS — K275 Chronic or unspecified peptic ulcer, site unspecified, with perforation: Secondary | ICD-10-CM | POA: Diagnosis not present

## 2023-03-23 LAB — GLUCOSE, CAPILLARY
Glucose-Capillary: 106 mg/dL — ABNORMAL HIGH (ref 70–99)
Glucose-Capillary: 39 mg/dL — CL (ref 70–99)
Glucose-Capillary: 57 mg/dL — ABNORMAL LOW (ref 70–99)
Glucose-Capillary: 72 mg/dL (ref 70–99)
Glucose-Capillary: 76 mg/dL (ref 70–99)
Glucose-Capillary: 77 mg/dL (ref 70–99)
Glucose-Capillary: 78 mg/dL (ref 70–99)
Glucose-Capillary: 79 mg/dL (ref 70–99)
Glucose-Capillary: 91 mg/dL (ref 70–99)
Glucose-Capillary: 94 mg/dL (ref 70–99)

## 2023-03-23 LAB — CBC
HCT: 33.3 % — ABNORMAL LOW (ref 36.0–46.0)
Hemoglobin: 10.9 g/dL — ABNORMAL LOW (ref 12.0–15.0)
MCH: 27.6 pg (ref 26.0–34.0)
MCHC: 32.7 g/dL (ref 30.0–36.0)
MCV: 84.3 fL (ref 80.0–100.0)
Platelets: 167 10*3/uL (ref 150–400)
RBC: 3.95 MIL/uL (ref 3.87–5.11)
RDW: 15.3 % (ref 11.5–15.5)
WBC: 10.7 10*3/uL — ABNORMAL HIGH (ref 4.0–10.5)
nRBC: 0 % (ref 0.0–0.2)

## 2023-03-23 LAB — BASIC METABOLIC PANEL
Anion gap: 8 (ref 5–15)
BUN: 30 mg/dL — ABNORMAL HIGH (ref 8–23)
CO2: 20 mmol/L — ABNORMAL LOW (ref 22–32)
Calcium: 6.6 mg/dL — ABNORMAL LOW (ref 8.9–10.3)
Chloride: 115 mmol/L — ABNORMAL HIGH (ref 98–111)
Creatinine, Ser: 1.36 mg/dL — ABNORMAL HIGH (ref 0.44–1.00)
GFR, Estimated: 38 mL/min — ABNORMAL LOW (ref >60)
Glucose, Bld: 96 mg/dL (ref 70–99)
Potassium: 3.1 mmol/L — ABNORMAL LOW (ref 3.5–5.1)
Sodium: 143 mmol/L (ref 135–145)

## 2023-03-23 LAB — AEROBIC/ANAEROBIC CULTURE W GRAM STAIN (SURGICAL/DEEP WOUND)

## 2023-03-23 LAB — PHOSPHORUS: Phosphorus: 5.7 mg/dL — ABNORMAL HIGH (ref 2.5–4.6)

## 2023-03-23 LAB — MAGNESIUM: Magnesium: 2 mg/dL (ref 1.7–2.4)

## 2023-03-23 LAB — CULTURE, BLOOD (ROUTINE X 2): Culture: NO GROWTH

## 2023-03-23 MED ORDER — METOPROLOL TARTRATE 5 MG/5ML IV SOLN
5.0000 mg | Freq: Three times a day (TID) | INTRAVENOUS | Status: DC
  Administered 2023-03-23 – 2023-03-25 (×5): 5 mg via INTRAVENOUS
  Filled 2023-03-23 (×6): qty 5

## 2023-03-23 MED ORDER — KCL IN DEXTROSE-NACL 40-5-0.45 MEQ/L-%-% IV SOLN: INTRAVENOUS | Status: DC

## 2023-03-23 MED ORDER — MENTHOL 3 MG MT LOZG
1.0000 | LOZENGE | OROMUCOSAL | Status: DC | PRN
  Administered 2023-03-23 – 2023-03-25 (×9): 3 mg via ORAL
  Filled 2023-03-23 (×4): qty 9

## 2023-03-23 MED ORDER — POTASSIUM CHLORIDE 10 MEQ/100ML IV SOLN
10.0000 meq | INTRAVENOUS | Status: AC
  Administered 2023-03-23 (×4): 10 meq via INTRAVENOUS
  Filled 2023-03-23 (×4): qty 100

## 2023-03-23 MED ORDER — METOPROLOL TARTRATE 5 MG/5ML IV SOLN
2.5000 mg | Freq: Once | INTRAVENOUS | Status: AC
  Administered 2023-03-23: 2.5 mg via INTRAVENOUS

## 2023-03-23 MED ORDER — DEXTROSE 50 % IV SOLN
12.5000 g | INTRAVENOUS | Status: AC
  Administered 2023-03-23: 12.5 g via INTRAVENOUS
  Filled 2023-03-23: qty 50

## 2023-03-23 NOTE — Progress Notes (Signed)
Progress Note   Patient: Miranda [name]:[id number] DOB: 1934-03-07 DOA: 03/20/2023     2 DOS: the patient was seen and examined on 03/23/2023   Brief hospital course: As per H&P written by Dr. Thomes Dinning on 03/21/2023. Miranda [name] is a 89 y.o. female with medical history significant of hypertension, hyperlipidemia who presents to the emergency department due to generalized abdominal pain which started this afternoon around 3 PM, she complained of nausea without vomiting and denies fever, chills, chest pain, shortness of breath, diarrhea.   ED Course:  In the emergency department, she was hemodynamically stable on arrival to the ED with normal vital signs.  Workup in the ED showed CBC with WBC of 11.9, hemoglobin 14.8, hematocrit 46.8, MCV 85.7, platelets 256.  BMP was normal except for blood glucose of 178, and creatinine of 1.15.  Lipase was 78 CT abdomen and pelvis with contrast showed findings compatible with perforated viscus with free fluid and free intraperitoneal gas tracking within the right anterior hemiabdomen along the cecum and ascending colon, suspicious for colonic perforation.  Retained stool within the rectal vault suspicious for fecal impaction.  Assessment and Plan: 1-abdominal pain in the setting of perforated peptic ulcer -Status post surgical surgical intervention with exploratory laparotomy and gastrorrhaphy. -Continue IV PPI and n.p.o. status -Will follow recommendations by general surgery regarding postoperative care and diet advancement..  2-respiratory failure/ventilatory support -Patient Under ventilatory support after surgery -Following commands during sedation holiday -Completed good weaning trial and has been successfully extubated. -Continue supportive care and as needed bronchodilators.  3-essential hypertension/PSVT -Continue treatment with metoprolol -Continue to follow heart rate and vital signs.  4-history of breast cancer -Planning to resume  the use of anastrozole when able to tolerate by mouth.  5-hyperlipidemia -Planning to resume the use of Crestor when tolerating p.o.'s.  6-GERD -Continue IV PPI.  7-acute kidney injury on chronic renal failure -Patient with a stage IIIb at baseline -Acute distress, surgery and transient hypotension most likely participating in acute kidney injury component. -Continue to minimize nephrotoxic agent -Continue to maintain adequate hydration -Follow renal function trend.  8-hyperphosphatemia/hypomagnesemia -Magnesium repleted and within normal limits currently. -Continue to maintain adequate hydration. -Phosphorus down to 5.7; will continue to follow trend.  9-hypokalemia -Will replete and follow electrolytes trend.  10-PSVT/hypertension -Continue treatment with Lopressor -Continue telemetry monitoring.  Subjective:  Extubated, oriented x 3; NG tube in place and demonstrating no significant pain, nausea or vomiting.  Patient is afebrile.  No bowel sounds on exam.  Physical Exam: Vitals:   03/23/23 1000 03/23/23 1122 03/23/23 1432 03/23/23 1619  BP: (!) 127/56  (!) 164/80   Pulse: (!) 106  91   Resp: 17  (!) 21   Temp:  97.8 F (36.6 C)  98.1 F (36.7 C)  TempSrc:  Oral  Oral  SpO2: 100%  100%   Weight:      Height:       General exam: Alert, awake, oriented x 3; currently extubated, following commands appropriately and in no major distress. Respiratory system: Positive scattered rhonchi; no using accessory muscle.  Good saturation on room air. Cardiovascular system: Sinus tachycardia, no rubs, no gallops, no JVD. Gastrointestinal system: Abdomen is nondistended, soft and nontender.  Wound is clean, intact and dry; JP drain which is serosanguineous drainage.  NG tube in place.  No bowel sounds appreciated on exam. Central nervous system: Alert and oriented. No focal neurological deficits. Extremities: No cyanosis or clubbing; SCDs in place. Skin: No petechiae  Psychiatry:  Judgement and insight appear normal. Mood & affect appropriate.   Data Reviewed: Basic metabolic panel: Sodium 143, potassium 3.1, chloride 115, bicarb 20, BUN 30, creatinine 1.36 and GFR 38. CBC: White blood cells 10.7, hemoglobin 10.9 and platelet count 167 K Phosphorus: 5.7  Family Communication: Son at bedside.  Disposition: Status is: Inpatient Remains inpatient appropriate because: Receiving ventilatory support s/p surgical intervention due to perforated viscus from perforated peptic ulcer disease.   Planned Discharge Destination: Hopefully Home; will follow response and determine the need for any rehabilitation after discharge.   Time spent: 50 minutes  Author: Vassie Loll, MD 03/23/2023 7:02 PM1 [email or web address].

## 2023-03-23 NOTE — Progress Notes (Signed)
Nutrition Follow up  DOCUMENTATION CODES:    INTERVENTION:  Patient NPO until Monday will f/u at that time   NUTRITION DIAGNOSIS:   Inadequate oral intake related to inability to eat as evidenced by NPO status.  GOAL:  Patient will meet greater than or equal to 90% of their needs  MONITOR: diet advancement   ASSESSMENT: Patient is an 89 yo female with hx of hypertension, asthma, lung cancer, right breast lumpectomy (2020).   4/24 Procedure- exploratory laparotomy, Graham plication and central line placed.  Patient is currently intubated on ventilator support 4/24 @ 2023. NGT in stomach. Discussed patient with nursing. Tube is not to be used per MD. Attempted wean today but returned to full support.   Medications and weights reviewed.  4/26 patient weaned this morning and is feeling well. Her sister and other family are bedside today. She is not hungry and says, the doctor told me it may be Monday before I can eat. No additional recommendations at present. Last BM- PTA  Intake/Output Summary (Last 24 hours) at 03/23/2023 1453 Last data filed at 03/23/2023 1356 Gross per 24 hour  Intake 3799.07 ml  Output 155 ml  Net 3644.07 ml       Latest Ref Rng & Units 03/23/2023    4:53 AM 03/22/2023    5:00 AM 03/21/2023    4:52 AM  BMP  Glucose 70 - 99 mg/dL 96  161  096   BUN 8 - 23 mg/dL 30  25  16    Creatinine 0.44 - 1.00 mg/dL 0.45  4.09  8.11   Sodium 135 - 145 mmol/L 143  141  139   Potassium 3.5 - 5.1 mmol/L 3.1  4.1  4.1   Chloride 98 - 111 mmol/L 115  110  106   CO2 22 - 32 mmol/L 20  20  20    Calcium 8.9 - 10.3 mg/dL 6.6  7.2  9.4       NUTRITION - FOCUSED PHYSICAL EXAM: NFPE conducted findings are mild orbital fat depletion, mild temporal muscle depletion and no edema.    Diet Order:   Diet Order             Diet NPO time specified  Diet effective now                   EDUCATION NEEDS:  Not appropriate for education at this time  Skin:  Skin  Assessment: Skin Integrity Issues: Skin Integrity Issues:: Incisions Incisions: abdomen  Last BM:  4/25 abdominal distention, constipation per nursing  Height:   Ht Readings from Last 1 Encounters:  03/21/23 5' (1.524 m)    Weight:   Wt Readings from Last 1 Encounters:  03/23/23 66 kg    Ideal Body Weight:   45 kg  BMI:  Body mass index is 28.42 kg/m.  Estimated Nutritional Needs:   Kcal:  1600-1700  Protein:  80-88 gr  Fluid:  >1500 ml/ day  Royann Shivers MS,RD,CSG,LDN Contact: AMION

## 2023-03-23 NOTE — Progress Notes (Signed)
2 Days Post-Op  Subjective: Intubated but alert  Objective: Vital signs in last 24 hours: Temp:  [97.9 F (36.6 C)-99.6 F (37.6 C)] 99.6 F (37.6 C) (04/26 0400) Pulse Rate:  [69-133] 78 (04/26 0700) Resp:  [11-24] 19 (04/26 0700) BP: (89-151)/(36-93) 131/54 (04/26 0700) SpO2:  [93 %-100 %] 100 % (04/26 0700) FiO2 (%):  [40 %] 40 % (04/26 0406) Weight:  [66 kg] 66 kg (04/26 0500) Last BM Date :  (Smear after suppository)  Intake/Output from previous day: 04/25 0701 - 04/26 0700 In: 5230.5 [I.V.:3048.5; IV Piggyback:2182] Out: 530 [Urine:300; Drains:230] Intake/Output this shift: Total I/O In: 398.9 [I.V.:295.6; IV Piggyback:103.3] Out: -   General appearance: alert, cooperative, and no distress Resp: clear to auscultation bilaterally Cardio: regular rate and rhythm, S1, S2 normal, no murmur, click, rub or gallop GI: Soft, incision healing well.  JP drainage serosanguineous in nature.  Lab Results:  Recent Labs    03/22/23 0500 03/23/23 0453  WBC 10.5 10.7*  HGB 13.2 10.9*  HCT 40.8 33.3*  PLT 185 167   BMET Recent Labs    03/22/23 0500 03/23/23 0453  NA 141 143  K 4.1 3.1*  CL 110 115*  CO2 20* 20*  GLUCOSE 128* 96  BUN 25* 30*  CREATININE 1.59* 1.36*  CALCIUM 7.2* 6.6*   PT/INR No results for input(s): "LABPROT", "INR" in the last 72 hours.  Studies/Results: DG CHEST PORT 1 VIEW  Result Date: 03/21/2023 CLINICAL DATA:  Nasogastric tube placement. Status post exploratory laparotomy for perforated viscus. EXAM: PORTABLE CHEST 1 VIEW COMPARISON:  11/29/2018. FINDINGS: The heart is enlarged and the mediastinal contour is stable. There is atherosclerotic calcification of the aorta. Surgical clips are present at the left hilum. Opacities are noted in the mid to lower right lung and left lung base. There are small bilateral pleural effusions. No pneumothorax. The endotracheal tube terminates 4.1 cm above the carina. An enteric tube courses over the left upper  quadrant and out of the field of view. IMPRESSION: 1. Airspace disease in the mid to lower right lung and left lung base, possible atelectasis, edema, or infiltrate. 2. Small bilateral pleural effusions. 3. Support apparatus as described above. Electronically Signed   By: Thornell Sartorius M.D.   On: 03/21/2023 21:12    Anti-infectives: Anti-infectives (From admission, onward)    Start     Dose/Rate Route Frequency Ordered Stop   03/21/23 0600  piperacillin-tazobactam (ZOSYN) IVPB 3.375 g        3.375 g 12.5 mL/hr over 240 Minutes Intravenous Every 8 hours 03/21/23 0208     03/20/23 2300  piperacillin-tazobactam (ZOSYN) IVPB 3.375 g        3.375 g 100 mL/hr over 30 Minutes Intravenous  Once 03/20/23 2250 03/21/23 0018       Assessment/Plan: s/p Procedure(s): EXPLORATORY LAPAROTOMY GASTRORRHAPHY Impression: Stable postoperative day 2.  Patient tolerated weaning well yesterday afternoon.  Will proceed with extubation this morning.  Hypokalemia has been addressed.  Will adjust IV fluids.  Monitor tachycardia.  LOS: 2 days    Franky Macho 03/23/2023

## 2023-03-23 NOTE — Care Management Important Message (Signed)
Important Message  Patient Details  Name: Miranda [name] MRN: [id number] Date of Birth: 09-15-1934   Medicare Important Message Given:  Yes     Corey Harold 03/23/2023, 10:28 AM

## 2023-03-23 NOTE — Procedures (Signed)
Extubation Procedure Note  Patient Details:   Name: Miranda [name] DOB: 1934/06/05 MRN: [id number]   Airway Documentation:    Vent end date: 03/23/23 Vent end time: 0825   Evaluation  O2 sats: stable throughout Complications: No apparent complications Patient did tolerate procedure well. Bilateral Breath Sounds: Clear, Diminished   Yes Patient weaned on 5/5 40% for 30 minutes talking to [place], writing notes, following commands.  Extubated to 24 L [place] tolerated well. Strong productive cough.  Was able to state her name and knew her location and family member in room.  Weaned O2 to 2 L sat 98%, HR 108, RR 20.  Will continue to monitor patient.  Elbert Ewings [place] 03/23/2023, 8:30 AM

## 2023-03-23 NOTE — Progress Notes (Signed)
Hypoglycemic Event  CBG: 57  Treatment: D50 25 mL (12.5 gm)  Symptoms: None  Follow-up CBG: Time:0139 CBG Result:106  Possible Reasons for Event: Inadequate meal intake/NPO      Jason Nest

## 2023-03-24 DIAGNOSIS — R198 Other specified symptoms and signs involving the digestive system and abdomen: Secondary | ICD-10-CM | POA: Diagnosis not present

## 2023-03-24 DIAGNOSIS — E861 Hypovolemia: Secondary | ICD-10-CM | POA: Diagnosis not present

## 2023-03-24 DIAGNOSIS — Z853 Personal history of malignant neoplasm of breast: Secondary | ICD-10-CM | POA: Diagnosis not present

## 2023-03-24 DIAGNOSIS — K275 Chronic or unspecified peptic ulcer, site unspecified, with perforation: Secondary | ICD-10-CM | POA: Diagnosis not present

## 2023-03-24 LAB — CBC
HCT: 32.2 % — ABNORMAL LOW (ref 36.0–46.0)
Hemoglobin: 10.4 g/dL — ABNORMAL LOW (ref 12.0–15.0)
MCH: 27.5 pg (ref 26.0–34.0)
MCHC: 32.3 g/dL (ref 30.0–36.0)
MCV: 85.2 fL (ref 80.0–100.0)
Platelets: 158 10*3/uL (ref 150–400)
RBC: 3.78 MIL/uL — ABNORMAL LOW (ref 3.87–5.11)
RDW: 15.1 % (ref 11.5–15.5)
WBC: 14 10*3/uL — ABNORMAL HIGH (ref 4.0–10.5)
nRBC: 0 % (ref 0.0–0.2)

## 2023-03-24 LAB — GLUCOSE, CAPILLARY
Glucose-Capillary: 106 mg/dL — ABNORMAL HIGH (ref 70–99)
Glucose-Capillary: 105 mg/dL — ABNORMAL HIGH (ref 70–99)
Glucose-Capillary: 110 mg/dL — ABNORMAL HIGH (ref 70–99)
Glucose-Capillary: 101 mg/dL — ABNORMAL HIGH (ref 70–99)
Glucose-Capillary: 100 mg/dL — ABNORMAL HIGH (ref 70–99)
Glucose-Capillary: 113 mg/dL — ABNORMAL HIGH (ref 70–99)

## 2023-03-24 LAB — COMPREHENSIVE METABOLIC PANEL
ALT: 20 U/L (ref 0–44)
AST: 26 U/L (ref 15–41)
Albumin: 2.1 g/dL — ABNORMAL LOW (ref 3.5–5.0)
Alkaline Phosphatase: 55 U/L (ref 38–126)
Anion gap: 5 (ref 5–15)
BUN: 18 mg/dL (ref 8–23)
CO2: 22 mmol/L (ref 22–32)
Calcium: 7.7 mg/dL — ABNORMAL LOW (ref 8.9–10.3)
Chloride: 117 mmol/L — ABNORMAL HIGH (ref 98–111)
Creatinine, Ser: 0.96 mg/dL (ref 0.44–1.00)
GFR, Estimated: 57 mL/min — ABNORMAL LOW (ref >60)
Glucose, Bld: 105 mg/dL — ABNORMAL HIGH (ref 70–99)
Potassium: 3.9 mmol/L (ref 3.5–5.1)
Sodium: 144 mmol/L (ref 135–145)
Total Bilirubin: 0.6 mg/dL (ref 0.3–1.2)
Total Protein: 5.1 g/dL — ABNORMAL LOW (ref 6.5–8.1)

## 2023-03-24 LAB — CULTURE, BLOOD (ROUTINE X 2)

## 2023-03-24 LAB — PHOSPHORUS: Phosphorus: 2.8 mg/dL (ref 2.5–4.6)

## 2023-03-24 LAB — MAGNESIUM: Magnesium: 2.1 mg/dL (ref 1.7–2.4)

## 2023-03-24 MED ORDER — FENTANYL CITRATE PF 50 MCG/ML IJ SOSY
50.0000 ug | PREFILLED_SYRINGE | INTRAMUSCULAR | Status: DC | PRN
  Administered 2023-03-24 – 2023-03-25 (×3): 50 ug via INTRAVENOUS
  Filled 2023-03-24 (×4): qty 1

## 2023-03-24 MED ORDER — ENOXAPARIN SODIUM 40 MG/0.4ML IJ SOSY
40.0000 mg | PREFILLED_SYRINGE | INTRAMUSCULAR | Status: DC
  Administered 2023-03-24 – 2023-03-29 (×6): 40 mg via SUBCUTANEOUS
  Filled 2023-03-24 (×6): qty 0.4

## 2023-03-24 MED ORDER — HYDRALAZINE HCL 20 MG/ML IJ SOLN
10.0000 mg | Freq: Once | INTRAMUSCULAR | Status: AC
  Administered 2023-03-24: 10 mg via INTRAVENOUS
  Filled 2023-03-24: qty 1

## 2023-03-24 NOTE — Progress Notes (Signed)
MD aware of blood pressure, see orders.

## 2023-03-24 NOTE — Progress Notes (Signed)
Patient awake and alert. Vital signs stable. Patient c/o pain once and PRN pain medication administered and patient reported it was effective. Patient mobilized at highest level of mobility once as today was the first day out of bed in several days. . Safety maintained, call bell within reach, bed in lowest position, bed alarm on and functioning without any issues. Will continue to monitor and endorse.

## 2023-03-24 NOTE — Progress Notes (Signed)
Pharmacy Antibiotic Note  Miranda [name] is a 89 y.o. female admitted on 03/20/2023 with  perforated viscus .  Pharmacy has been consulted for Zosyn dosing.  Cultures negative to date. Improving slowly, extubated yesterday. Afebrile.  Plan: Continue Zosyn 3.375G IV q8h to be infused over 4 hours Trend WBC, temp, renal function  F/U infectious work-up   Height: 5' (152.4 cm) Weight: 75 kg (165 lb 5.5 oz) IBW/kg (Calculated) : 45.5  Temp (24hrs), Avg:98.1 F (36.7 C), Min:97.5 F (36.4 C), Max:98.7 F (37.1 C)  Recent Labs  Lab 03/20/23 2008 03/21/23 0452 03/21/23 0631 03/22/23 0500 03/23/23 0453 03/24/23 0353  WBC 11.9*  --  8.8 10.5 10.7* 14.0*  CREATININE 1.15* 1.10*  --  1.59* 1.36* 0.96     Estimated Creatinine Clearance: 37.3 mL/min (by C-G formula based on SCr of 0.96 mg/dL).    Allergies  Allergen Reactions   Bee Venom Swelling   Zosyn 4/24 >>  Cultures: 4/24 BCX: ngtd 4/24 Body fluid cx: no growth 4/24 MRSA PCR neg  Elder Cyphers, BS Pharm D, BCPS Clinical Pharmacist

## 2023-03-24 NOTE — TOC Initial Note (Signed)
Transition of Care [facility]) - Initial/Assessment Note    Patient Details  Name: Miranda [name] MRN: [id number] Date of Birth: August 10, 1934  [facility] CM/SW Contact:    Catalina Gravel, LCSW Phone Number: 03/24/2023, 5:02 PM  Clinical Narrative:                  Pt resides with adult niece who reports pt will return to her home at [place]. Dc2-3 days TOC to follow.    Barriers to Discharge: Continued Medical Work up   Patient Goals and CMS Choice            Expected Discharge Plan and Services                                              Prior Living Arrangements/Services                       Activities of Daily Living Home Assistive Devices/Equipment: None ADL Screening (condition at time of admission) Patient's cognitive ability adequate to safely complete daily activities?: Yes Is the patient deaf or have difficulty hearing?: No Does the patient have difficulty seeing, even when wearing glasses/contacts?: No Does the patient have difficulty concentrating, remembering, or making decisions?: No Patient able to express need for assistance with ADLs?: No Does the patient have difficulty dressing or bathing?: No Independently performs ADLs?: Yes (appropriate for developmental age) Does the patient have difficulty walking or climbing stairs?: No Weakness of Legs: Both Weakness of Arms/Hands: None  Permission Sought/Granted                  Emotional Assessment              Admission diagnosis:  Perforated viscus [R19.8] Lower abdominal pain [R10.30] Patient Active Problem List   Diagnosis Date Noted   Perforated viscus 03/21/2023   History of breast cancer 03/21/2023   Hypotension due to hypovolemia 03/21/2023   Perforated peptic ulcer (HCC) 03/20/2023   Carpal tunnel syndrome, left upper limb    Malignant neoplasm of lower-inner quadrant of right breast of female, estrogen receptor positive (HCC) 06/12/2019    Spondylolisthesis of lumbar region 02/03/2018   Lumbar spondylosis 02/03/2018   PSVT (paroxysmal supraventricular tachycardia) 01/05/2012   Near syncope 12/05/2011   Palpitations 12/05/2011   Abnormal ECG 12/05/2011   HYPERLIPIDEMIA 11/10/2006   Essential hypertension, benign 11/10/2006   PCP:  Benita Stabile, MD Pharmacy:   [facility] [phone] - [place], [place] - [address] AT [facility] [address] [place] [address] Phone: [phone] Fax: [phone]     Social Determinants of Health (SDOH) Social History: SDOH Screenings   Food Insecurity: No Food Insecurity (03/21/2023)  Housing: Low Risk  (03/21/2023)  Transportation Needs: No Transportation Needs (03/21/2023)  Utilities: Not At Risk (03/21/2023)  Tobacco Use: Low Risk  (03/23/2023)   SDOH Interventions:     Readmission Risk Interventions     No data to display

## 2023-03-24 NOTE — Progress Notes (Signed)
Patient states to this RN, that he doesn't want to go back to the facility where he came from,  he started becoming tearful and said, he don't want to go back , this RN asked where does he want to go, he said in this hospital , endorsed to the oncoming nurse.

## 2023-03-24 NOTE — Progress Notes (Signed)
3 Days Post-Op  Subjective: Patient sitting in chair.  Has no incisional pain at the present time.  Patient did have a bowel movement.  Objective: Vital signs in last 24 hours: Temp:  [97.5 F (36.4 C)-98.7 F (37.1 C)] 97.7 F (36.5 C) (04/27 1143) Pulse Rate:  [70-109] 73 (04/27 1000) Resp:  [18-25] 23 (04/27 1000) BP: (149-177)/(49-110) 154/72 (04/27 1000) SpO2:  [81 %-100 %] 100 % (04/27 1000) Weight:  [75 kg] 75 kg (04/27 0500) Last BM Date : 03/24/23  Intake/Output from previous day: 04/26 0701 - 04/27 0700 In: 2019.8 [I.V.:1437; IV Piggyback:582.8] Out: 1115 [Urine:1100; Drains:15] Intake/Output this shift: Total I/O In: 164.6 [I.V.:131.7; IV Piggyback:32.9] Out: 0   General appearance: alert, cooperative, and no distress Resp: clear to auscultation bilaterally Cardio: regular rate and rhythm, S1, S2 normal, no murmur, click, rub or gallop GI: Soft, incision healing well.  JP drain with minimal serosanguineous fluid present.  Lab Results:  Recent Labs    03/23/23 0453 03/24/23 0353  WBC 10.7* 14.0*  HGB 10.9* 10.4*  HCT 33.3* 32.2*  PLT 167 158   BMET Recent Labs    03/23/23 0453 03/24/23 0353  NA 143 144  K 3.1* 3.9  CL 115* 117*  CO2 20* 22  GLUCOSE 96 105*  BUN 30* 18  CREATININE 1.36* 0.96  CALCIUM 6.6* 7.7*   PT/INR No results for input(s): "LABPROT", "INR" in the last 72 hours.  Studies/Results: DG Chest Port 1 View  Result Date: 03/23/2023 CLINICAL DATA:  Provided history: Encounter for intubation. Status post laparotomy for perforated viscus. EXAM: PORTABLE CHEST 1 VIEW COMPARISON:  Prior chest radiographs 03/21/2023 and earlier. FINDINGS: ET tube present with tip at the level of the clavicular heads. An enteric tube passes below the level of the left hemidiaphragm and terminates outside of the field of view. Borderline cardiomegaly. Aortic atherosclerosis. Hazy opacity within the mid and lower right lung which may reflect atelectasis, a  pleural effusion and/or pneumonia. Ill-defined opacity within the medial left lung base which may reflect atelectasis and/or pneumonia. No definite left pleural effusion. No evidence of pneumothorax. No acute osseous abnormality identified. Degenerative changes of the spine. IMPRESSION: 1. Support apparatus as described. 2. Persistent hazy opacity within the mid and lower right lung which may reflect atelectasis, a pleural effusion and/or pneumonia. 3. Persistent opacity within the medial left lung base, which may reflect atelectasis and/or pneumonia. 4. Borderline cardiomegaly. 5.  Aortic Atherosclerosis (ICD10-I70.0). Electronically Signed   By: Jackey Loge D.O.   On: 03/23/2023 08:10    Anti-infectives: Anti-infectives (From admission, onward)    Start     Dose/Rate Route Frequency Ordered Stop   03/21/23 0600  piperacillin-tazobactam (ZOSYN) IVPB 3.375 g        3.375 g 12.5 mL/hr over 240 Minutes Intravenous Every 8 hours 03/21/23 0208     03/20/23 2300  piperacillin-tazobactam (ZOSYN) IVPB 3.375 g        3.375 g 100 mL/hr over 30 Minutes Intravenous  Once 03/20/23 2250 03/21/23 0018       Assessment/Plan: s/p Procedure(s): EXPLORATORY LAPAROTOMY GASTRORRHAPHY Impression: Postoperative day 3, progressing well.  Renal function has normalized.  Still has mild leukocytosis most likely secondary to surgical intervention.  Would continue Zosyn.  Will remove Foley.  Will get upper GI study on Monday to assess [name] plication.  LOS: 3 days    Miranda [name] 03/24/2023

## 2023-03-24 NOTE — Progress Notes (Signed)
Progress Note   Patient: Miranda [name]:[id number] DOB: 1934-01-20 DOA: 03/20/2023     3 DOS: the patient was seen and examined on 03/24/2023   Brief hospital course: As per H&P written by Dr. Thomes Dinning on 03/21/2023. Miranda [name] is a 89 y.o. female with medical history significant of hypertension, hyperlipidemia who presents to the emergency department due to generalized abdominal pain which started this afternoon around 3 PM, she complained of nausea without vomiting and denies fever, chills, chest pain, shortness of breath, diarrhea.   ED Course:  In the emergency department, she was hemodynamically stable on arrival to the ED with normal vital signs.  Workup in the ED showed CBC with WBC of 11.9, hemoglobin 14.8, hematocrit 46.8, MCV 85.7, platelets 256.  BMP was normal except for blood glucose of 178, and creatinine of 1.15.  Lipase was 78 CT abdomen and pelvis with contrast showed findings compatible with perforated viscus with free fluid and free intraperitoneal gas tracking within the right anterior hemiabdomen along the cecum and ascending colon, suspicious for colonic perforation.  Retained stool within the rectal vault suspicious for fecal impaction.  Assessment and Plan: 1-abdominal pain in the setting of perforated peptic ulcer -Status post surgical surgical intervention with exploratory laparotomy and gastrorrhaphy. -Continue IV PPI and n.p.o. status -Will follow recommendations by general surgery regarding postoperative care and diet advancement.. -With a slight increased in patient's WBCs -Continue the use of Zosyn -General surgery postoperative care.  2-respiratory failure/ventilatory support -Patient Under ventilatory support after surgery -Following commands during sedation holiday -Completed good weaning trial and has been successfully extubated. -Continue supportive care and as needed bronchodilators.  3-essential hypertension/PSVT -Continue treatment with  metoprolol -Continue to follow heart rate and vital signs.  4-history of breast cancer -Planning to resume the use of anastrozole when able to tolerate by mouth.  5-hyperlipidemia -Planning to resume the use of Crestor when tolerating p.o.'s.  6-GERD -Continue IV PPI.  7-acute kidney injury on chronic renal failure -Patient with a stage IIIb at baseline -Acute distress, surgery and transient hypotension most likely participating in acute kidney injury component. -Continue to minimize nephrotoxic agent -Continue to maintain adequate hydration -Follow renal function trend.  8-hyperphosphatemia/hypomagnesemia -Magnesium repleted and within normal limits currently. -Continue to maintain adequate hydration. -Phosphorus down to 5.7; will continue to follow trend.  9-hypokalemia -Repleted and within normal limits -Continue to follow electrolytes trend.  10-PSVT/hypertension -Continue treatment with Lopressor -Continue telemetry monitoring. -Well-controlled currently.  Subjective:  With no acute overnight events or complaints; expressing no nausea, no vomiting and no significant abdominal pain.  Good saturation on room air appreciated.  Patient reporting having a small bowel movement.  Physical Exam: Vitals:   03/24/23 0846 03/24/23 1000 03/24/23 1143 03/24/23 1626  BP:  (!) 154/72    Pulse:  73    Resp:  (!) 23    Temp: (!) 97.5 F (36.4 C)  97.7 F (36.5 C) 98.2 F (36.8 C)  TempSrc: Oral  Oral Oral  SpO2:  100%    Weight:      Height:       General exam: Alert, awake, oriented x 3; reporting no chest pain, no nausea or vomiting.  NG tube in place.  Expressed having a small bowel movement.  No significant pain or discomfort in her abdomen reported. Respiratory system: Good air movement bilaterally; no using accessory muscles. Cardiovascular system: Rate controlled; no rubs, no gallops, no JVD. Gastrointestinal system: Abdomen is soft, incision adequately healing; JP  drain with minimal serosanguineous fluid and no appreciated bowel sounds on exam. Central nervous system: Alert and oriented. No focal neurological deficits. Extremities: No cyanosis, clubbing or edema. Skin: No petechiae. Psychiatry: Judgement and insight appear normal. Mood & affect appropriate.   Data Reviewed: Comprehensive metabolic panel: Sodium 144, potassium 3.9, chloride 117, bicarb 22, BUN 18, creatinine 0.96 and normal LFTs.  GFR 57 CBC: WBCs 14.0, hemoglobin 10.4 and platelet count 158 K Magnesium: 2.1  Family Communication: Son at bedside.  Disposition: Status is: Inpatient Remains inpatient appropriate because: Receiving ventilatory support s/p surgical intervention due to perforated viscus from perforated peptic ulcer disease.  Stable to transfer to telemetry bed.   Planned Discharge Destination: Hopefully Home; will follow response and determine the need for any rehabilitation after discharge.   Time spent: 50 minutes  Author: Vassie Loll, MD 03/24/2023 4:37 PM1 [email or web address]

## 2023-03-25 DIAGNOSIS — R198 Other specified symptoms and signs involving the digestive system and abdomen: Secondary | ICD-10-CM | POA: Diagnosis not present

## 2023-03-25 DIAGNOSIS — E861 Hypovolemia: Secondary | ICD-10-CM | POA: Diagnosis not present

## 2023-03-25 DIAGNOSIS — Z853 Personal history of malignant neoplasm of breast: Secondary | ICD-10-CM | POA: Diagnosis not present

## 2023-03-25 DIAGNOSIS — K275 Chronic or unspecified peptic ulcer, site unspecified, with perforation: Secondary | ICD-10-CM | POA: Diagnosis not present

## 2023-03-25 LAB — CBC
HCT: 36.5 % (ref 36.0–46.0)
Hemoglobin: 11.6 g/dL — ABNORMAL LOW (ref 12.0–15.0)
MCH: 27.3 pg (ref 26.0–34.0)
MCHC: 31.8 g/dL (ref 30.0–36.0)
MCV: 85.9 fL (ref 80.0–100.0)
Platelets: 184 10*3/uL (ref 150–400)
RBC: 4.25 MIL/uL (ref 3.87–5.11)
RDW: 15.1 % (ref 11.5–15.5)
WBC: 10.5 10*3/uL (ref 4.0–10.5)
nRBC: 0 % (ref 0.0–0.2)

## 2023-03-25 LAB — GLUCOSE, CAPILLARY
Glucose-Capillary: 107 mg/dL — ABNORMAL HIGH (ref 70–99)
Glucose-Capillary: 108 mg/dL — ABNORMAL HIGH (ref 70–99)
Glucose-Capillary: 114 mg/dL — ABNORMAL HIGH (ref 70–99)
Glucose-Capillary: 115 mg/dL — ABNORMAL HIGH (ref 70–99)
Glucose-Capillary: 121 mg/dL — ABNORMAL HIGH (ref 70–99)
Glucose-Capillary: 122 mg/dL — ABNORMAL HIGH (ref 70–99)

## 2023-03-25 LAB — BASIC METABOLIC PANEL
Anion gap: 8 (ref 5–15)
BUN: 10 mg/dL (ref 8–23)
CO2: 22 mmol/L (ref 22–32)
Calcium: 8.3 mg/dL — ABNORMAL LOW (ref 8.9–10.3)
Chloride: 113 mmol/L — ABNORMAL HIGH (ref 98–111)
Creatinine, Ser: 0.73 mg/dL (ref 0.44–1.00)
GFR, Estimated: >60 mL/min (ref >60)
Glucose, Bld: 118 mg/dL — ABNORMAL HIGH (ref 70–99)
Potassium: 3.1 mmol/L — ABNORMAL LOW (ref 3.5–5.1)
Sodium: 143 mmol/L (ref 135–145)

## 2023-03-25 LAB — CULTURE, BLOOD (ROUTINE X 2)

## 2023-03-25 LAB — H. PYLORI ANTIGEN, STOOL: H. Pylori Stool Ag, Eia: NEGATIVE

## 2023-03-25 MED ORDER — HYDRALAZINE HCL 20 MG/ML IJ SOLN
10.0000 mg | Freq: Three times a day (TID) | INTRAMUSCULAR | Status: DC | PRN
  Administered 2023-03-25 – 2023-03-26 (×2): 10 mg via INTRAVENOUS
  Filled 2023-03-25 (×2): qty 1

## 2023-03-25 MED ORDER — HYDROMORPHONE HCL 1 MG/ML IJ SOLN
1.0000 mg | INTRAMUSCULAR | Status: DC | PRN
  Administered 2023-03-25 – 2023-03-27 (×4): 1 mg via INTRAVENOUS
  Filled 2023-03-25 (×4): qty 1

## 2023-03-25 MED ORDER — METOPROLOL TARTRATE 5 MG/5ML IV SOLN
5.0000 mg | Freq: Four times a day (QID) | INTRAVENOUS | Status: DC
  Administered 2023-03-25 – 2023-03-26 (×5): 5 mg via INTRAVENOUS
  Filled 2023-03-25 (×5): qty 5

## 2023-03-25 MED ORDER — POTASSIUM CHLORIDE 10 MEQ/100ML IV SOLN
10.0000 meq | INTRAVENOUS | Status: AC
  Administered 2023-03-25 (×4): 10 meq via INTRAVENOUS
  Filled 2023-03-25 (×4): qty 100

## 2023-03-25 NOTE — Progress Notes (Signed)
Progress Note   Patient: Miranda [name] UYQ:[id number] DOB: 25-Sep-1934 DOA: 03/20/2023     4 DOS: the patient was seen and examined on 03/25/2023   Brief hospital course: As per H&P written by Dr. Thomes Dinning on 03/21/2023. Miranda [name] is a 89 y.o. female with medical history significant of hypertension, hyperlipidemia who presents to the emergency department due to generalized abdominal pain which started this afternoon around 3 PM, she complained of nausea without vomiting and denies fever, chills, chest pain, shortness of breath, diarrhea.   ED Course:  In the emergency department, she was hemodynamically stable on arrival to the ED with normal vital signs.  Workup in the ED showed CBC with WBC of 11.9, hemoglobin 14.8, hematocrit 46.8, MCV 85.7, platelets 256.  BMP was normal except for blood glucose of 178, and creatinine of 1.15.  Lipase was 78 CT abdomen and pelvis with contrast showed findings compatible with perforated viscus with free fluid and free intraperitoneal gas tracking within the right anterior hemiabdomen along the cecum and ascending colon, suspicious for colonic perforation.  Retained stool within the rectal vault suspicious for fecal impaction.  Assessment and Plan: 1-abdominal pain in the setting of perforated peptic ulcer -Status post surgical surgical intervention with exploratory laparotomy and gastrorrhaphy. -Continue IV PPI and n.p.o. status -Will follow recommendations by general surgery regarding postoperative care and diet advancement.  At the moment with plans for upper GI series in the morning to further assess gastrorrhaphy. -With a slight increased in patient's WBCs and also low-grade temperature overnight. -Continue the use of Zosyn  2-respiratory failure/ventilatory support -Patient Under ventilatory support after surgery -Following commands during sedation holiday -Completed good weaning trial and has been successfully extubated. -Continue supportive  care and as needed bronchodilators. -Flutter valve/incentive spirometer has been recommended -Continue to completely wean off oxygen supplementation as tolerated.  3-essential hypertension/PSVT -Continue treatment with metoprolol (dose adjusted due to component of arrhythmia/tachycardia). -As needed hydralazine has also been ordered. -Continue to follow heart rate and vital signs. -Replete electrolytes for a potassium goal above 4 and magnesium above 2.  4-history of breast cancer -Planning to resume the use of anastrozole when able to tolerate by mouth.  5-hyperlipidemia -Planning to resume the use of Crestor when tolerating p.o.'s.  6-GERD -Continue IV PPI.  7-acute kidney injury on chronic renal failure -Patient with a stage IIIb at baseline -Acute distress, surgery and transient hypotension most likely participating in acute kidney injury component. -Continue to minimize nephrotoxic agent -Continue to maintain adequate hydration -Follow renal function trend.  8-hyperphosphatemia/hypomagnesemia -Magnesium repleted and within normal limits currently. -Continue to maintain adequate hydration. -Phosphorus down to 5.7; will continue to follow trend.  9-hypokalemia -Repleted and within normal limits -Continue to follow electrolytes trend.  10-PSVT/hypertension -Continue treatment with Lopressor -Continue telemetry monitoring. -Well-controlled currently.  Subjective:  Reporting a slight abdominal pain; no chest pain, no nausea, no vomiting, no palpitations.  Physical Exam: Vitals:   03/25/23 0900 03/25/23 0930 03/25/23 1000 03/25/23 1030  BP: (!) 172/77 (!) 190/92 (!) 172/99 (!) 108/58  Pulse: 93     Resp: (!) 21 (!) 22 (!) 22 (!) 24  Temp:      TempSrc:      SpO2: 100%     Weight:      Height:       General exam: NG tube in place, no fever, no nausea vomiting.  Reporting slight abdominal pain. Respiratory system: Positive scattered rhonchi; no using accessory  muscles. Cardiovascular system: Sinus  tachycardia, no rubs, no gallops, no JVD.  Telemetry evaluation demonstrating component of SVT. Gastrointestinal system: Abdomen is soft, incision adequately healing; JP drain with minimal serosanguineous fluid and no appreciated bowel sounds on exam. Central nervous system: Alert and oriented. No focal neurological deficits. Extremities: No cyanosis, clubbing or edema. Skin: No petechiae; abdominal wound clean, dry and intact. Psychiatry: Judgement and insight appear normal. Mood & affect appropriate.   Data Reviewed: Basic metabolic panel: sodium 143, potassium 3.1, chloride 113, bicarb 22, BUN 10, creatinine 0.73  Family Communication: Son at bedside.  Disposition: Status is: Inpatient Remains inpatient appropriate because: Receiving ventilatory support s/p surgical intervention due to perforated viscus from perforated peptic ulcer disease.  Stable to transfer to telemetry bed.   Planned Discharge Destination: Hopefully Home; will follow response and determine the need for any rehabilitation after discharge.   Time spent: 50 minutes  Author: Vassie Loll, MD 03/25/2023 10:54 AM1 [email or web address]

## 2023-03-25 NOTE — Progress Notes (Signed)
MD aware of BP

## 2023-03-25 NOTE — Progress Notes (Signed)
4 Days Post-Op  Subjective: Patient denies any abdominal pain.  Does feel cold.  Objective: Vital signs in last 24 hours: Temp:  [97.7 F (36.5 C)-100.8 F (38.2 C)] 98.5 F (36.9 C) (04/28 0748) Pulse Rate:  [54-94] 93 (04/28 0900) Resp:  [18-27] 21 (04/28 0900) BP: (148-190)/(69-97) 172/77 (04/28 0900) SpO2:  [94 %-100 %] 100 % (04/28 0900) Weight:  [77.1 kg] 77.1 kg (04/28 0457) Last BM Date : 03/24/23  Intake/Output from previous day: 04/27 0701 - 04/28 0700 In: 1235.6 [I.V.:1090.6; IV Piggyback:145] Out: 1020 [Urine:550; Emesis/NG output:450; Drains:20] Intake/Output this shift: No intake/output data recorded.  General appearance: alert, cooperative, and no distress Resp: clear to auscultation bilaterally Cardio: Currently with sinus tachycardia GI: Soft, incision healing well.  JP drainage serosanguineous and minimal.  No bile noted.  Lab Results:  Recent Labs    03/24/23 0353 03/25/23 0529  WBC 14.0* 10.5  HGB 10.4* 11.6*  HCT 32.2* 36.5  PLT 158 184   BMET Recent Labs    03/24/23 0353 03/25/23 0529  NA 144 143  K 3.9 3.1*  CL 117* 113*  CO2 22 22  GLUCOSE 105* 118*  BUN 18 10  CREATININE 0.96 0.73  CALCIUM 7.7* 8.3*   PT/INR No results for input(s): "LABPROT", "INR" in the last 72 hours.  Studies/Results: No results found.  Anti-infectives: Anti-infectives (From admission, onward)    Start     Dose/Rate Route Frequency Ordered Stop   03/21/23 0600  piperacillin-tazobactam (ZOSYN) IVPB 3.375 g        3.375 g 12.5 mL/hr over 240 Minutes Intravenous Every 8 hours 03/21/23 0208     03/20/23 2300  piperacillin-tazobactam (ZOSYN) IVPB 3.375 g        3.375 g 100 mL/hr over 30 Minutes Intravenous  Once 03/20/23 2250 03/21/23 0018       Assessment/Plan: s/p Procedure(s): EXPLORATORY LAPAROTOMY GASTRORRHAPHY Impression: Patient with episodes of SVT.  This is being addressed by Dr. Gwenlyn Perking.  Mild hypokalemia is being addressed.  Patient will  get upper GI series to assess the gastrorrhaphy tomorrow.  Hopefully can remove NG tube at that time and start p.o. intake.  LOS: 4 days    Franky Macho 03/25/2023

## 2023-03-25 NOTE — Progress Notes (Signed)
Patient's HR sustained between 140-150 on monitor. Did jump to 160. EKG completed. Patient also with axillary temp 100.8. BP elevated periodically throughout night. Rhythm between SR and afib on monitor sustained for about 30 minutes. Cooling patient with ice and HR has begun to decrease, currently between 90-110, back in SR. MD aware of all, verbal order to give 6 am dose metoprolol now. Will continue to monitor.

## 2023-03-26 ENCOUNTER — Inpatient Hospital Stay (HOSPITAL_COMMUNITY): Payer: Medicare Other

## 2023-03-26 DIAGNOSIS — K275 Chronic or unspecified peptic ulcer, site unspecified, with perforation: Secondary | ICD-10-CM | POA: Diagnosis not present

## 2023-03-26 DIAGNOSIS — E861 Hypovolemia: Secondary | ICD-10-CM | POA: Diagnosis not present

## 2023-03-26 DIAGNOSIS — R198 Other specified symptoms and signs involving the digestive system and abdomen: Secondary | ICD-10-CM | POA: Diagnosis not present

## 2023-03-26 DIAGNOSIS — Z853 Personal history of malignant neoplasm of breast: Secondary | ICD-10-CM | POA: Diagnosis not present

## 2023-03-26 LAB — COMPREHENSIVE METABOLIC PANEL
ALT: 33 U/L (ref 0–44)
AST: 35 U/L (ref 15–41)
Albumin: 2.4 g/dL — ABNORMAL LOW (ref 3.5–5.0)
Alkaline Phosphatase: 60 U/L (ref 38–126)
Anion gap: 10 (ref 5–15)
BUN: 13 mg/dL (ref 8–23)
CO2: 23 mmol/L (ref 22–32)
Calcium: 8.7 mg/dL — ABNORMAL LOW (ref 8.9–10.3)
Chloride: 111 mmol/L (ref 98–111)
Creatinine, Ser: 0.81 mg/dL (ref 0.44–1.00)
GFR, Estimated: >60 mL/min (ref >60)
Glucose, Bld: 121 mg/dL — ABNORMAL HIGH (ref 70–99)
Potassium: 3.9 mmol/L (ref 3.5–5.1)
Sodium: 144 mmol/L (ref 135–145)
Total Bilirubin: 1 mg/dL (ref 0.3–1.2)
Total Protein: 6.2 g/dL — ABNORMAL LOW (ref 6.5–8.1)

## 2023-03-26 LAB — CBC
HCT: 39.8 % (ref 36.0–46.0)
Hemoglobin: 13.1 g/dL (ref 12.0–15.0)
MCH: 27.5 pg (ref 26.0–34.0)
MCHC: 32.9 g/dL (ref 30.0–36.0)
MCV: 83.4 fL (ref 80.0–100.0)
Platelets: 176 10*3/uL (ref 150–400)
RBC: 4.77 MIL/uL (ref 3.87–5.11)
RDW: 15.4 % (ref 11.5–15.5)
WBC: 7.7 10*3/uL (ref 4.0–10.5)
nRBC: 0 % (ref 0.0–0.2)

## 2023-03-26 LAB — GLUCOSE, CAPILLARY
Glucose-Capillary: 117 mg/dL — ABNORMAL HIGH (ref 70–99)
Glucose-Capillary: 131 mg/dL — ABNORMAL HIGH (ref 70–99)
Glucose-Capillary: 112 mg/dL — ABNORMAL HIGH (ref 70–99)
Glucose-Capillary: 112 mg/dL — ABNORMAL HIGH (ref 70–99)
Glucose-Capillary: 122 mg/dL — ABNORMAL HIGH (ref 70–99)

## 2023-03-26 LAB — CULTURE, BLOOD (ROUTINE X 2)

## 2023-03-26 MED ORDER — METOPROLOL TARTRATE 5 MG/5ML IV SOLN
10.0000 mg | Freq: Four times a day (QID) | INTRAVENOUS | Status: DC
  Administered 2023-03-26 (×2): 10 mg via INTRAVENOUS
  Administered 2023-03-26: 5 mg via INTRAVENOUS
  Administered 2023-03-27 – 2023-03-29 (×9): 10 mg via INTRAVENOUS
  Filled 2023-03-26 (×12): qty 10

## 2023-03-26 MED ORDER — DILTIAZEM HCL 25 MG/5ML IV SOLN
INTRAVENOUS | Status: AC
  Filled 2023-03-26: qty 5

## 2023-03-26 MED ORDER — DILTIAZEM HCL 25 MG/5ML IV SOLN
10.0000 mg | Freq: Once | INTRAVENOUS | Status: AC
  Administered 2023-03-26: 10 mg via INTRAVENOUS

## 2023-03-26 NOTE — Progress Notes (Signed)
NGT removed without difficulty, tolerated well. No s/o trauma. Transferred to med floor, diet ordrered.

## 2023-03-26 NOTE — Evaluation (Signed)
Physical Therapy Evaluation Patient Details Name: Miranda [name] MRN: [id number] DOB: 1934-06-14 Today's Date: 03/26/2023  History of Present Illness  Miranda [name] is a 89 y.o. female with medical history significant of hypertension, hyperlipidemia who presents to the emergency department due to generalized abdominal pain which started this afternoon around 3 PM, she complained of nausea without vomiting and denies fever, chills, chest pain, shortness of breath, diarrhea.   Clinical Impression  Patient demonstrates slow labored movement for sitting up at bedside, unsteady on feet requiring use of RW for safety to take steps forward/backward at bedside, no loss of balance and limited mostly due to c/o fatigue.  Patient tolerated sitting up in chair after therapy with her son present in room - RN notified.  Patient will benefit from continued skilled physical therapy in hospital and recommended venue below to increase strength, balance, endurance for safe ADLs and gait.          Recommendations for follow up therapy are one component of a multi-disciplinary discharge planning process, led by the attending physician.  Recommendations may be updated based on patient status, additional functional criteria and insurance authorization.  Follow Up Recommendations       Assistance Recommended at Discharge Set up Supervision/Assistance  Patient can return home with the following  A little help with bathing/dressing/bathroom;Help with stairs or ramp for entrance;Assistance with cooking/housework;A little help with walking and/or transfers    Equipment Recommendations Rolling walker (2 wheels)  Recommendations for Other Services       Functional Status Assessment Patient has had a recent decline in their functional status and demonstrates the ability to make significant improvements in function in a reasonable and predictable amount of time.     Precautions / Restrictions  Precautions Precautions: Fall Restrictions Weight Bearing Restrictions: No      Mobility  Bed Mobility Overal bed mobility: Needs Assistance Bed Mobility: Supine to Sit     Supine to sit: Min assist, Mod assist     General bed mobility comments: increased time, labored movement    Transfers Overall transfer level: Needs assistance Equipment used: Rolling walker (2 wheels) Transfers: Sit to/from Stand, Bed to chair/wheelchair/BSC Sit to Stand: Min assist   Step pivot transfers: Min assist       General transfer comment: slow labored movement    Ambulation/Gait Ambulation/Gait assistance: Min assist Gait Distance (Feet): 10 Feet Assistive device: Rolling walker (2 wheels) Gait Pattern/deviations: Decreased step length - left, Decreased stance time - right, Decreased stride length Gait velocity: decreased     General Gait Details: limited to a few steps forward/backward at bedside before having to sit due to c/o fatigue  Stairs            Wheelchair Mobility    Modified Rankin (Stroke Patients Only)       Balance Overall balance assessment: Needs assistance Sitting-balance support: Feet supported, No upper extremity supported   Sitting balance - Comments: good seated at EOB   Standing balance support: During functional activity, No upper extremity supported Standing balance-Leahy Scale: Poor Standing balance comment: fair using RW                             Pertinent Vitals/Pain Pain Assessment Pain Assessment: No/denies pain    Home Living Family/patient expects to be discharged to:: Private residence Living Arrangements: Children Available Help at Discharge: Family;Available 24 hours/day Type of Home: House Home Access: Stairs to  enter Entrance Stairs-Rails: None Entrance Stairs-Number of Steps: 1 Alternate Level Stairs-Number of Steps: 15-20 steps to basement (patient will not be going to basement per son) Home Layout: Able  to live on main level with bedroom/bathroom;Full bath on main level;Two level Home Equipment: Rollator (4 wheels) Additional Comments: can borrow Rollator "per patient's son"    Prior Function Prior Level of Function : Independent/Modified Independent;Driving             Mobility Comments: Tourist information centre manager without AD ADLs Comments: Independent     Hand Dominance        Extremity/Trunk Assessment   Upper Extremity Assessment Upper Extremity Assessment: Generalized weakness    Lower Extremity Assessment Lower Extremity Assessment: Generalized weakness    Cervical / Trunk Assessment Cervical / Trunk Assessment: Kyphotic  Communication   Communication: No difficulties  Cognition Arousal/Alertness: Awake/alert Behavior During Therapy: WFL for tasks assessed/performed Overall Cognitive Status: Within Functional Limits for tasks assessed                                          General Comments      Exercises     Assessment/Plan    PT Assessment Patient needs continued PT services  PT Problem List Decreased strength;Decreased activity tolerance;Decreased balance;Decreased mobility       PT Treatment Interventions DME instruction;Gait training;Stair training;Functional mobility training;Therapeutic activities;Therapeutic exercise;Patient/family education;Balance training    PT Goals (Current goals can be found in the Care Plan section)  Acute Rehab PT Goals Patient Stated Goal: return  home with family to assist PT Goal Formulation: With patient/family Time For Goal Achievement: 04/02/23 Potential to Achieve Goals: Good    Frequency Min 3X/week     Co-evaluation               AM-PAC PT "6 Clicks" Mobility  Outcome Measure Help needed turning from your back to your side while in a flat bed without using bedrails?: A Little Help needed moving from lying on your back to sitting on the side of a flat bed without using bedrails?: A  Lot Help needed moving to and from a bed to a chair (including a wheelchair)?: A Little Help needed standing up from a chair using your arms (e.g., wheelchair or bedside chair)?: A Little Help needed to walk in hospital room?: A Little Help needed climbing 3-5 steps with a railing? : A Lot 6 Click Score: 16    End of Session   Activity Tolerance: Patient tolerated treatment well;Patient limited by fatigue Patient left: in chair;with call bell/phone within reach;with family/visitor present Nurse Communication: Mobility status PT Visit Diagnosis: Unsteadiness on feet (R26.81);Other abnormalities of gait and mobility (R26.89);Muscle weakness (generalized) (M62.81)    Time: 7829-5621 PT Time Calculation (min) (ACUTE ONLY): 24 min   Charges:   PT Evaluation $PT Eval Moderate Complexity: 1 Mod PT Treatments $Therapeutic Activity: 23-37 mins        3:42 PM, 03/26/23 Ocie Bob, MPT Physical Therapist with [facility] [phone] office [phone] mobile phone

## 2023-03-26 NOTE — Progress Notes (Signed)
5 Days Post-Op  Subjective: Patient has minimal incisional pain.  Objective: Vital signs in last 24 hours: Temp:  [97.5 F (36.4 C)-98.8 F (37.1 C)] 98.8 F (37.1 C) (04/29 1052) Pulse Rate:  [36-150] 150 (04/29 1052) Resp:  [16-27] 25 (04/29 1052) BP: (111-168)/(56-97) 156/84 (04/29 0700) SpO2:  [94 %-100 %] 97 % (04/29 1052) Last BM Date : 03/25/23  Intake/Output from previous day: 04/28 0701 - 04/29 0700 In: 1698.3 [I.V.:1179.3; IV Piggyback:519] Out: 1220 [Urine:700; Emesis/NG output:500; Drains:20] Intake/Output this shift: Total I/O In: 120 [P.O.:120] Out: 75 [Urine:75]  General appearance: alert, cooperative, and no distress Resp: clear to auscultation bilaterally Cardio: regular rate and rhythm, S1, S2 normal, no murmur, click, rub or gallop GI: Soft, incision healing well.  JP drainage minimal and serous in nature.  No bile present.  Lab Results:  Recent Labs    03/25/23 0529 03/26/23 0504  WBC 10.5 7.7  HGB 11.6* 13.1  HCT 36.5 39.8  PLT 184 176   BMET Recent Labs    03/25/23 0529 03/26/23 0504  NA 143 144  K 3.1* 3.9  CL 113* 111  CO2 22 23  GLUCOSE 118* 121*  BUN 10 13  CREATININE 0.73 0.81  CALCIUM 8.3* 8.7*   PT/INR No results for input(s): "LABPROT", "INR" in the last 72 hours.  Studies/Results: No results found.  Anti-infectives: Anti-infectives (From admission, onward)    Start     Dose/Rate Route Frequency Ordered Stop   03/21/23 0600  piperacillin-tazobactam (ZOSYN) IVPB 3.375 g        3.375 g 12.5 mL/hr over 240 Minutes Intravenous Every 8 hours 03/21/23 0208     03/20/23 2300  piperacillin-tazobactam (ZOSYN) IVPB 3.375 g        3.375 g 100 mL/hr over 30 Minutes Intravenous  Once 03/20/23 2250 03/21/23 0018       Assessment/Plan: s/p Procedure(s): EXPLORATORY LAPAROTOMY GASTRORRHAPHY Impression: Stable on postoperative day 5.  Awaiting upper GI study.  Further management is pending those results.  LOS: 5 days     Franky Macho 03/26/2023

## 2023-03-26 NOTE — Progress Notes (Signed)
   03/26/23 1447  Vitals  Temp 97.7 F (36.5 C)  Temp Source Oral  BP (!) 138/92  MAP (mmHg) 105  BP Location Left Wrist  BP Method Automatic  Patient Position (if appropriate) Sitting  Pulse Rate (!) 130  Pulse Rate Source Monitor  Resp (!) 21  MEWS COLOR  MEWS Score Color Red  Oxygen Therapy  SpO2 100 %  Pain Assessment  Pain Scale 0-10  Pain Score 1  MEWS Score  MEWS Temp 0  MEWS Systolic 0  MEWS Pulse 3  MEWS RR 1  MEWS LOC 0  MEWS Score 4  Provider Notification  Provider Name/Title Dr. Gwenlyn Perking  Date Provider Notified 03/26/23  Time Provider Notified [phone]  Notification Reason Other (Comment) (RED MEWS)  Provider response No new orders  Date of Provider Response 03/26/23  Time of Provider Response 1450

## 2023-03-26 NOTE — Progress Notes (Signed)
Progress Note   Patient: Miranda [name] VFI:[id number] DOB: 02-Apr-1934 DOA: 03/20/2023     5 DOS: the patient was seen and examined on 03/26/2023   Brief hospital course: As per H&P written by Dr. Thomes Dinning on 03/21/2023. Miranda [name] is a 89 y.o. female with medical history significant of hypertension, hyperlipidemia who presents to the emergency department due to generalized abdominal pain which started this afternoon around 3 PM, she complained of nausea without vomiting and denies fever, chills, chest pain, shortness of breath, diarrhea.   ED Course:  In the emergency department, she was hemodynamically stable on arrival to the ED with normal vital signs.  Workup in the ED showed CBC with WBC of 11.9, hemoglobin 14.8, hematocrit 46.8, MCV 85.7, platelets 256.  BMP was normal except for blood glucose of 178, and creatinine of 1.15.  Lipase was 78 CT abdomen and pelvis with contrast showed findings compatible with perforated viscus with free fluid and free intraperitoneal gas tracking within the right anterior hemiabdomen along the cecum and ascending colon, suspicious for colonic perforation.  Retained stool within the rectal vault suspicious for fecal impaction.  Assessment and Plan: 1-abdominal pain in the setting of perforated peptic ulcer -Status post surgical surgical intervention with exploratory laparotomy and gastrorrhaphy. -Continue IV PPI and n.p.o. status -Will continue to follow recommendations by general surgery regarding postoperative care and diet advancement.   -Pending upper GI series evaluation to determine stability and wellbeing gastrorrhaphy. -WBCs within normal limits and no further low-grade fever appreciated. -Continue the use of Zosyn for now.  2-respiratory failure/ventilatory support -Patient Under ventilatory support after surgery -Following commands during sedation holiday -Completed good weaning trial and has been successfully extubated. -Continue  supportive care and as needed bronchodilators. -Flutter valve/incentive spirometer has been recommended -Continue to completely wean off oxygen supplementation as tolerated.  3-essential hypertension/PSVT -Continue treatment with metoprolol (dose adjusted due to component of arrhythmia/tachycardia). -As needed hydralazine has also been ordered. -Continue to follow heart rate and vital signs. -Replete electrolytes for a potassium goal above 4 and magnesium above 2. -Stable and well-controlled currently. -Continue telemetry monitoring.  4-history of breast cancer -Planning to resume the use of anastrozole when able to tolerate by mouth.  5-hyperlipidemia -Planning to resume the use of Crestor when tolerating p.o.'s.  6-GERD -Continue IV PPI. -No signs of overt bleeding appreciated.  7-acute kidney injury on chronic renal failure -Patient with a stage IIIb at baseline -Acute distress, surgery and transient hypotension most likely participating in acute kidney injury component. -Continue to minimize nephrotoxic agent -Continue to maintain adequate hydration -Follow renal function trend.  8-hyperphosphatemia/hypomagnesemia -Magnesium repleted and within normal limits currently. -Continue to maintain adequate hydration. -Phosphorus down to 5.7; will continue to follow trend.  9-hypokalemia -Repleted and within normal limits -Continue to follow electrolytes trend. -Current potassium level 3.9.  Subjective:  Pain, no palpitations, reports no nausea or vomiting.  Minimal incisional pain in her abdomen expressed.  Physical Exam: Vitals:   03/26/23 1447 03/26/23 1506 03/26/23 1520 03/26/23 1525  BP: (!) 138/92 128/78 121/71 115/61  Pulse: (!) 130 (!) 137 (!) 130 95  Resp: (!) 21 20 20 20   Temp: 97.7 F (36.5 C)     TempSrc: Oral     SpO2: 100% 97% 96% 100%  Weight:      Height:       General exam: Alert, awake, oriented x 3; tolerating ice chips and expressing just minimal  incisional pain in her abdomen.  No fever,  no chest pain or palpitations. Respiratory system: Good saturation on room air; no using accessory muscles. Cardiovascular system: Rate controlled, no rubs, no gallops, no JVD. Gastrointestinal system: Abdomen is nondistended, soft and demonstrating clean and dry incisional wound.  No guarding on exam. Central nervous system: Alert and oriented. No focal neurological deficits. Extremities: No cyanosis or clubbing. Skin: No petechiae. Psychiatry: Judgement and insight appear normal. Mood & affect appropriate.   Data Reviewed: CBC: White blood cell 7.7, hemoglobin 13.1 and platelet count 176 K Comprehensive metabolic panel: Sodium 144, potassium 3.9, chloride 111, bicarb 23, BUN 13, creatinine 0.81, normal LFTs and GFR> 60.  Family Communication: Son at bedside.  Disposition: Status is: Inpatient Remains inpatient appropriate because: Receiving ventilatory support s/p surgical intervention due to perforated viscus from perforated peptic ulcer disease.  Stable to transfer to telemetry bed.   Planned Discharge Destination: Hopefully Home; will follow response and determine the need for any rehabilitation after discharge.   Time spent: 50 minutes  Author: Vassie Loll, MD 03/26/2023 3:42 PM1 [email or web address]

## 2023-03-26 NOTE — Progress Notes (Signed)
Patient's bed scale not working, will pass on to weigh her on standing scale when she gets out of bed this AM.

## 2023-03-26 NOTE — Progress Notes (Signed)
   03/26/23 1520  Assess: MEWS Score  BP 121/71  Pulse Rate (!) 130  Resp 20  Level of Consciousness Alert  SpO2 96 %  O2 Device Room Air  Patient Activity (if Appropriate) In chair  Assess: MEWS Score  MEWS Temp 0  MEWS Systolic 0  MEWS Pulse 3  MEWS RR 0  MEWS LOC 0  MEWS Score 3  MEWS Score Color Yellow  Assess: if the MEWS score is Yellow or Red  Were vital signs taken at a resting state? Yes  Focused Assessment No change from prior assessment  Does the patient meet 2 or more of the SIRS criteria? No  MEWS guidelines implemented  Yes, yellow  Treat  MEWS Interventions Considered administering scheduled or prn medications/treatments as ordered  Take Vital Signs  Increase Vital Sign Frequency  Yellow: Q2hr x1, continue Q4hrs until patient remains green for 12hrs  Escalate  MEWS: Escalate Yellow: Discuss with charge nurse and consider notifying provider and/or RRT  Notify: Charge Nurse/RN  Name of Charge Nurse/RN Notified Yehuda Savannah, RN  Provider Notification  Provider Name/Title Dr Gwenlyn Perking  Date Provider Notified 03/26/23  Time Provider Notified 1510  Method of Notification Page (per brittany schwartz LPN)  Notification Reason Other (Comment) (HR sustaining 140s)  Provider response See new orders  Date of Provider Response 03/26/23  Time of Provider Response 1515  Assess: SIRS CRITERIA  SIRS Temperature  0  SIRS Pulse 1  SIRS Respirations  0  SIRS WBC 0  SIRS Score Sum  1

## 2023-03-26 NOTE — Plan of Care (Signed)
  Problem: Acute Rehab PT Goals(only PT should resolve) Goal: Pt Will Go Supine/Side To Sit Outcome: Progressing Flowsheets (Taken 03/26/2023 1543) Pt will go Supine/Side to Sit: with min guard assist Goal: Patient Will Transfer Sit To/From Stand Outcome: Progressing Flowsheets (Taken 03/26/2023 1543) Patient will transfer sit to/from stand: with min guard assist Goal: Pt Will Transfer Bed To Chair/Chair To Bed Outcome: Progressing Flowsheets (Taken 03/26/2023 1543) Pt will Transfer Bed to Chair/Chair to Bed: min guard assist Goal: Pt Will Ambulate Outcome: Progressing Flowsheets (Taken 03/26/2023 1543) Pt will Ambulate:  50 feet  with min guard assist  with minimal assist  with rolling walker   3:44 PM, 03/26/23 Ocie Bob, MPT Physical Therapist with [facility] [phone] office [phone] mobile phone

## 2023-03-27 DIAGNOSIS — Z853 Personal history of malignant neoplasm of breast: Secondary | ICD-10-CM | POA: Diagnosis not present

## 2023-03-27 DIAGNOSIS — I471 Supraventricular tachycardia, unspecified: Secondary | ICD-10-CM | POA: Diagnosis not present

## 2023-03-27 DIAGNOSIS — R198 Other specified symptoms and signs involving the digestive system and abdomen: Secondary | ICD-10-CM | POA: Diagnosis not present

## 2023-03-27 DIAGNOSIS — I10 Essential (primary) hypertension: Secondary | ICD-10-CM | POA: Diagnosis not present

## 2023-03-27 LAB — GLUCOSE, CAPILLARY
Glucose-Capillary: 111 mg/dL — ABNORMAL HIGH (ref 70–99)
Glucose-Capillary: 111 mg/dL — ABNORMAL HIGH (ref 70–99)
Glucose-Capillary: 121 mg/dL — ABNORMAL HIGH (ref 70–99)
Glucose-Capillary: 124 mg/dL — ABNORMAL HIGH (ref 70–99)
Glucose-Capillary: 137 mg/dL — ABNORMAL HIGH (ref 70–99)
Glucose-Capillary: 137 mg/dL — ABNORMAL HIGH (ref 70–99)
Glucose-Capillary: 148 mg/dL — ABNORMAL HIGH (ref 70–99)

## 2023-03-27 LAB — AEROBIC/ANAEROBIC CULTURE W GRAM STAIN (SURGICAL/DEEP WOUND): Culture: NO GROWTH

## 2023-03-27 NOTE — TOC Initial Note (Signed)
Transition of Care [facility]) - Initial/Assessment Note    Patient Details  Name: Miranda [name] MRN: [id number] Date of Birth: September 21, 1934  [facility] CM/SW Contact:    Annice Needy, LCSW Phone Number: 03/27/2023, 3:26 PM  Clinical Narrative:                 Patient from home with niece, Miranda [name]. Admitted for perforated viscus. PT recommends HHPT. Miranda [name] is agreeable to services. Referral accepted by [facility]. Miranda [name] states that patient may d/c to her son and daughter-in-law's home as they are retired and home all day and can assist patient while she regains her strength.   Expected Discharge Plan: Home w Home Health Services Barriers to Discharge: Continued Medical Work up   Patient Goals and CMS Choice            Expected Discharge Plan and Services                                     [facility] Agency: [facility] ([facility]) Date HH Agency Contacted: 03/27/23 Time HH Agency Contacted: 1524 Representative spoke with at [facility] Agency: Morrie Sheldon  Prior Living Arrangements/Services     Patient language and need for interpreter reviewed:: Yes        Need for Family Participation in Patient Care: Yes (Comment) Care giver support system in place?: Yes (comment)   Criminal Activity/Legal Involvement Pertinent to Current Situation/Hospitalization: No - Comment as needed  Activities of Daily Living Home Assistive Devices/Equipment: None ADL Screening (condition at time of admission) Patient's cognitive ability adequate to safely complete daily activities?: Yes Is the patient deaf or have difficulty hearing?: No Does the patient have difficulty seeing, even when wearing glasses/contacts?: No Does the patient have difficulty concentrating, remembering, or making decisions?: No Patient able to express need for assistance with ADLs?: No Does the patient have difficulty dressing or bathing?: No Independently performs ADLs?: Yes (appropriate  for developmental age) Does the patient have difficulty walking or climbing stairs?: No Weakness of Legs: Both Weakness of Arms/Hands: None  Permission Sought/Granted Permission sought to share information with : Family Supports    Share Information with NAME: Miranda [name], neice           Emotional Assessment     Affect (typically observed): Appropriate   Alcohol / Substance Use: Not Applicable Psych Involvement: No (comment)  Admission diagnosis:  Perforated viscus [R19.8] Lower abdominal pain [R10.30] Patient Active Problem List   Diagnosis Date Noted   Perforated viscus 03/21/2023   History of breast cancer 03/21/2023   Hypotension due to hypovolemia 03/21/2023   Perforated peptic ulcer (HCC) 03/20/2023   Carpal tunnel syndrome, left upper limb    Malignant neoplasm of lower-inner quadrant of right breast of female, estrogen receptor positive (HCC) 06/12/2019   Spondylolisthesis of lumbar region 02/03/2018   Lumbar spondylosis 02/03/2018   PSVT (paroxysmal supraventricular tachycardia) 01/05/2012   Near syncope 12/05/2011   Palpitations 12/05/2011   Abnormal ECG 12/05/2011   HYPERLIPIDEMIA 11/10/2006   Essential hypertension, benign 11/10/2006   PCP:  Benita Stabile, MD Pharmacy:   [id number] - [place], [place] - [address] AT [facility] [address] [place] [address] Phone: [phone] Fax: [phone]     Social Determinants of Health (SDOH) Social History: SDOH Screenings   Food Insecurity: No Food Insecurity (03/21/2023)  Housing: Low Risk  (  03/21/2023)  Transportation Needs: No Transportation Needs (03/21/2023)  Utilities: Not At Risk (03/21/2023)  Tobacco Use: Low Risk  (03/23/2023)   SDOH Interventions:     Readmission Risk Interventions     No data to display

## 2023-03-27 NOTE — Progress Notes (Signed)
6 Days Post-Op  Subjective: Patient has minimal epigastric pain.  No vomiting noted.  Her appetite is decreased.  She continues to have bowel movements.  Objective: Vital signs in last 24 hours: Temp:  [97.7 F (36.5 C)-98.8 F (37.1 C)] 98.6 F (37 C) (04/30 0359) Pulse Rate:  [52-150] 59 (04/30 0835) Resp:  [17-25] 17 (04/30 0835) BP: (115-163)/(61-98) 163/83 (04/30 0835) SpO2:  [96 %-100 %] 96 % (04/30 0524) Last BM Date : 03/25/23  Intake/Output from previous day: 04/29 0701 - 04/30 0700 In: 360 [P.O.:360] Out: 125 [Urine:75; Drains:50] Intake/Output this shift: No intake/output data recorded.  General appearance: alert, cooperative, and no distress Resp: clear to auscultation bilaterally Cardio: regular rate and rhythm, S1, S2 normal, no murmur, click, rub or gallop GI: Soft, incision healing well.  JP drainage serous in nature.  Lab Results:  Recent Labs    03/25/23 0529 03/26/23 0504  WBC 10.5 7.7  HGB 11.6* 13.1  HCT 36.5 39.8  PLT 184 176   BMET Recent Labs    03/25/23 0529 03/26/23 0504  NA 143 144  K 3.1* 3.9  CL 113* 111  CO2 22 23  GLUCOSE 118* 121*  BUN 10 13  CREATININE 0.73 0.81  CALCIUM 8.3* 8.7*   PT/INR No results for input(s): "LABPROT", "INR" in the last 72 hours.  Studies/Results: DG UGI W SINGLE CM (SOL OR THIN BA)  Result Date: 03/26/2023 CLINICAL DATA:  Perforated gastric ulcer anterior pyloric region post [name] patch, postoperative evaluation EXAM: WATER SOLUBLE UPPER GI SERIES TECHNIQUE: Single-column upper GI series was performed using water soluble contrast. Radiation Exposure Index (as provided by the fluoroscopic device): 50.6 mGy Kerma CONTRAST:  Gastrografin COMPARISON:  CT abdomen and pelvis 03/20/2023 FLUOROSCOPY: Fluoroscopy Time:  2 minutes 18 seconds FINDINGS: Positioning supine, RIGHT lateral, and prone. Normal gastric distension. Wall thickening at the pyloric region consistent with edema related to ulcer and surgery.  Contrast however passes into the duodenal sweep and proximal small bowel loops without obstruction. Surgical drain noted in the RIGHT upper abdomen. No extravasation of contrast is seen at the site of surgical repair nor along the surgical drain to suggest continued leak post surgery. IMPRESSION: Distal gastric wall edema consistent with ulcer and prior surgery. No contrast extravasation identified. Electronically Signed   By: Ulyses Southward M.D.   On: 03/26/2023 14:09    Anti-infectives: Anti-infectives (From admission, onward)    Start     Dose/Rate Route Frequency Ordered Stop   03/21/23 0600  piperacillin-tazobactam (ZOSYN) IVPB 3.375 g        3.375 g 12.5 mL/hr over 240 Minutes Intravenous Every 8 hours 03/21/23 0208     03/20/23 2300  piperacillin-tazobactam (ZOSYN) IVPB 3.375 g        3.375 g 100 mL/hr over 30 Minutes Intravenous  Once 03/20/23 2250 03/21/23 0018       Assessment/Plan: s/p Procedure(s): EXPLORATORY LAPAROTOMY GASTRORRHAPHY Impression: Postoperative day 6.  Upper GI yesterday showed a sealed repair of her peptic ulcer.  Her NG tube has been removed and her diet is being advanced.  Patient is considering skilled nursing placement due to deconditioning.  Will stop IV antibiotic on day 7.  Blood pressure medications as per Dr. Gwenlyn Perking.  Overall, patient progressing well.  LOS: 6 days    Miranda [name] 03/27/2023

## 2023-03-27 NOTE — Progress Notes (Signed)
Mobility Specialist Progress Note:    03/27/23 0930  Mobility  Activity Ambulated with assistance in hallway  Level of Assistance Contact guard assist, steadying assist  Assistive Device Front wheel walker  Distance Ambulated (ft) 100 ft  Activity Response Tolerated well  Mobility Referral Yes  $Mobility charge 1 Mobility   Pt agreeable to mobility session. Tolerated well, c/o slight discomfort in abdomen. SpO2 93% on RA. Returned pt to chair, son arrived to room, all needs met.   Feliciana Rossetti Mobility Specialist Please contact via Special educational needs teacher or  Rehab office at [phone]

## 2023-03-27 NOTE — Progress Notes (Signed)
Hospitalist made aware of 9 beat run of V tach per central tele. Pt lying in bed resting, talking with visitors. Denies pain or distress. Call bell in reach.

## 2023-03-27 NOTE — Progress Notes (Signed)
Progress Note   Patient: Miranda [name]:[id number] DOB: 22-Dec-1933 DOA: 03/20/2023     6 DOS: the patient was seen and examined on 03/27/2023   Brief hospital course: As per H&P written by Dr. Thomes Dinning on 03/21/2023. Miranda [name] is a 89 y.o. female with medical history significant of hypertension, hyperlipidemia who presents to the emergency department due to generalized abdominal pain which started this afternoon around 3 PM, she complained of nausea without vomiting and denies fever, chills, chest pain, shortness of breath, diarrhea.   ED Course:  In the emergency department, she was hemodynamically stable on arrival to the ED with normal vital signs.  Workup in the ED showed CBC with WBC of 11.9, hemoglobin 14.8, hematocrit 46.8, MCV 85.7, platelets 256.  BMP was normal except for blood glucose of 178, and creatinine of 1.15.  Lipase was 78 CT abdomen and pelvis with contrast showed findings compatible with perforated viscus with free fluid and free intraperitoneal gas tracking within the right anterior hemiabdomen along the cecum and ascending colon, suspicious for colonic perforation.  Retained stool within the rectal vault suspicious for fecal impaction.  Assessment and Plan: 1-abdominal pain in the setting of perforated peptic ulcer -Status post surgical surgical intervention with exploratory laparotomy and gastrorrhaphy. -Continue IV PPI and full liquid diet. -Will continue to follow recommendations by general surgery regarding postoperative care and diet advancement.   -Pending upper GI series evaluation to determine stability and wellbeing gastrorrhaphy. -WBCs within normal limits and no further low-grade fever appreciated. -Continue the use of Zosyn to complete a total of 7 days.  2-respiratory failure/ventilatory support -Patient went under ventilatory support after surgery -Completed good weaning trial and has been successfully extubated. -Demonstrating good oxygen  saturation on room air at the moment -Continue flutter valve/incentive spirometer use.  3-essential hypertension/PSVT -Continue treatment with metoprolol (dose adjusted due to component of arrhythmia/tachycardia). -Continue as needed hydralazine. -Continue to follow heart rate and vital signs. -Replete electrolytes for a potassium goal above 4 and magnesium above 2. -Stable and well-controlled currently. -Continue telemetry monitoring.  4-history of breast cancer -Planning to resume the use of anastrozole when able to fully tolerate by mouth.  5-hyperlipidemia -Planning to resume the use of Crestor when fully tolerating p.o.'s.  6-GERD -Continue IV PPI. -No signs of overt bleeding appreciated. -Continue to follow hemoglobin trend.  7-acute kidney injury on chronic renal failure -Patient with a stage IIIb at baseline -Acute distress, surgery and transient hypotension most likely participating in acute kidney injury component. -Continue to minimize nephrotoxic agent -Continue to maintain adequate hydration -Continue to follow renal function trend.  8-hyperphosphatemia/hypomagnesemia -Continue to maintain adequate hydration. -Improved/repleted and corrected. -Continue to follow electrolytes trend intermittently.  9-hypokalemia -Repleted and within normal limits -Continue to follow electrolytes trend/stability. -Goal is for potassium above 4.  Subjective:  Denies chest pain, no shortness of breath, no nausea, no vomiting.  Patient moving her bowels and tolerating full liquid diet so far.  Heart rate controlled and reporting no overnight events.  Physical Exam: Vitals:   03/27/23 0359 03/27/23 0524 03/27/23 0835 03/27/23 1448  BP: 137/80 (!) 146/85 (!) 163/83 (!) 154/71  Pulse: (!) 52 (!) 107 (!) 59 94  Resp: 18  17 20   Temp: 98.6 F (37 C)   99.1 F (37.3 C)  TempSrc: Oral   Oral  SpO2: 96% 96%  97%  Weight:      Height:       General exam: Alert, awake, oriented x  3; denying chest pain and shortness of breath.  Complaining of mild incisional abdominal pain.  Tolerating full liquid diet and having bowel movements. Respiratory system: Clear to auscultation. Respiratory effort normal.  No using accessory muscle.  Good saturation on room air. Cardiovascular system: Rate controlled; no rubs, no gallops, no JVD appreciated on exam. Gastrointestinal system: Abdomen is soft, nontender; positive bowel sounds.  Abdominal wound intact, dried and without signs of infection. Central nervous system: Alert and oriented. No focal neurological deficits. Extremities: No cyanosis or clubbing. Skin: No petechiae.  This Psychiatry: Judgement and insight appear normal. Mood & affect appropriate.   Latest data Reviewed: CBC: White blood cell 7.7, hemoglobin 13.1 and platelet count 176 K Comprehensive metabolic panel: Sodium 144, potassium 3.9, chloride 111, bicarb 23, BUN 13, creatinine 0.81, normal LFTs and GFR> 60.  Family Communication: Son at bedside.  Disposition: Status is: Inpatient Remains inpatient appropriate because: Receiving ventilatory support s/p surgical intervention due to perforated viscus from perforated peptic ulcer disease.  Stable to transfer to telemetry bed.   Planned Discharge Destination: Hopefully Home; will follow response and determine the need for any rehabilitation after discharge.   Time spent: 50 minutes  Author: Vassie Loll, MD 03/27/2023 5:12 PM1 [email or web address].

## 2023-03-28 DIAGNOSIS — R198 Other specified symptoms and signs involving the digestive system and abdomen: Secondary | ICD-10-CM | POA: Diagnosis not present

## 2023-03-28 LAB — GLUCOSE, CAPILLARY
Glucose-Capillary: 117 mg/dL — ABNORMAL HIGH (ref 70–99)
Glucose-Capillary: 120 mg/dL — ABNORMAL HIGH (ref 70–99)
Glucose-Capillary: 121 mg/dL — ABNORMAL HIGH (ref 70–99)
Glucose-Capillary: 131 mg/dL — ABNORMAL HIGH (ref 70–99)
Glucose-Capillary: 147 mg/dL — ABNORMAL HIGH (ref 70–99)

## 2023-03-28 LAB — BASIC METABOLIC PANEL
Anion gap: 7 (ref 5–15)
BUN: 9 mg/dL (ref 8–23)
CO2: 24 mmol/L (ref 22–32)
Calcium: 8.1 mg/dL — ABNORMAL LOW (ref 8.9–10.3)
Chloride: 107 mmol/L (ref 98–111)
Creatinine, Ser: 0.8 mg/dL (ref 0.44–1.00)
GFR, Estimated: >60 mL/min (ref >60)
Glucose, Bld: 114 mg/dL — ABNORMAL HIGH (ref 70–99)
Potassium: 2.9 mmol/L — ABNORMAL LOW (ref 3.5–5.1)
Sodium: 138 mmol/L (ref 135–145)

## 2023-03-28 LAB — CBC
HCT: 31.5 % — ABNORMAL LOW (ref 36.0–46.0)
Hemoglobin: 10.5 g/dL — ABNORMAL LOW (ref 12.0–15.0)
MCH: 27.7 pg (ref 26.0–34.0)
MCHC: 33.3 g/dL (ref 30.0–36.0)
MCV: 83.1 fL (ref 80.0–100.0)
Platelets: 210 10*3/uL (ref 150–400)
RBC: 3.79 MIL/uL — ABNORMAL LOW (ref 3.87–5.11)
RDW: 14.6 % (ref 11.5–15.5)
WBC: 6.6 10*3/uL (ref 4.0–10.5)
nRBC: 0 % (ref 0.0–0.2)

## 2023-03-28 LAB — MAGNESIUM: Magnesium: 1.7 mg/dL (ref 1.7–2.4)

## 2023-03-28 MED ORDER — TIMOLOL MALEATE 0.5 % OP SOLN
1.0000 [drp] | Freq: Every day | OPHTHALMIC | Status: DC
  Administered 2023-03-28 – 2023-03-29 (×2): 1 [drp] via OPHTHALMIC
  Filled 2023-03-28: qty 5

## 2023-03-28 MED ORDER — POTASSIUM CHLORIDE CRYS ER 20 MEQ PO TBCR
40.0000 meq | EXTENDED_RELEASE_TABLET | Freq: Two times a day (BID) | ORAL | Status: AC
  Administered 2023-03-28 (×2): 40 meq via ORAL
  Filled 2023-03-28 (×2): qty 2

## 2023-03-28 MED ORDER — MAGNESIUM SULFATE 2 GM/50ML IV SOLN
2.0000 g | Freq: Once | INTRAVENOUS | Status: AC
  Administered 2023-03-28: 2 g via INTRAVENOUS
  Filled 2023-03-28: qty 50

## 2023-03-28 MED ORDER — LATANOPROST 0.005 % OP SOLN
1.0000 [drp] | Freq: Every day | OPHTHALMIC | Status: DC
  Administered 2023-03-28: 1 [drp] via OPHTHALMIC
  Filled 2023-03-28: qty 2.5

## 2023-03-28 NOTE — Progress Notes (Signed)
7 Days Post-Op  Subjective: Patient tolerating heart healthy diet okay.  Some indigestion but no nausea or vomiting.  Objective: Vital signs in last 24 hours: Temp:  [98.4 F (36.9 C)-99.1 F (37.3 C)] 98.6 F (37 C) (05/01 0359) Pulse Rate:  [46-99] 67 (05/01 0359) Resp:  [16-20] 16 (05/01 0359) BP: (145-158)/(55-95) 145/55 (05/01 0359) SpO2:  [96 %-98 %] 98 % (05/01 0359) Last BM Date : 03/27/23  Intake/Output from previous day: 04/30 0701 - 05/01 0700 In: -  Out: 5 [Drains:5] Intake/Output this shift: No intake/output data recorded.  General appearance: alert, cooperative, and no distress GI: Soft, incision healing well.  JP drainage minimal and serous in nature.  Lab Results:  Recent Labs    03/26/23 0504 03/28/23 0334  WBC 7.7 6.6  HGB 13.1 10.5*  HCT 39.8 31.5*  PLT 176 210   BMET Recent Labs    03/26/23 0504 03/28/23 0334  NA 144 138  K 3.9 2.9*  CL 111 107  CO2 23 24  GLUCOSE 121* 114*  BUN 13 9  CREATININE 0.81 0.80  CALCIUM 8.7* 8.1*   PT/INR No results for input(s): "LABPROT", "INR" in the last 72 hours.  Studies/Results: DG UGI W SINGLE CM (SOL OR THIN BA)  Result Date: 03/26/2023 CLINICAL DATA:  Perforated gastric ulcer anterior pyloric region post [name] patch, postoperative evaluation EXAM: WATER SOLUBLE UPPER GI SERIES TECHNIQUE: Single-column upper GI series was performed using water soluble contrast. Radiation Exposure Index (as provided by the fluoroscopic device): 50.6 mGy Kerma CONTRAST:  Gastrografin COMPARISON:  CT abdomen and pelvis 03/20/2023 FLUOROSCOPY: Fluoroscopy Time:  2 minutes 18 seconds FINDINGS: Positioning supine, RIGHT lateral, and prone. Normal gastric distension. Wall thickening at the pyloric region consistent with edema related to ulcer and surgery. Contrast however passes into the duodenal sweep and proximal small bowel loops without obstruction. Surgical drain noted in the RIGHT upper abdomen. No extravasation of  contrast is seen at the site of surgical repair nor along the surgical drain to suggest continued leak post surgery. IMPRESSION: Distal gastric wall edema consistent with ulcer and prior surgery. No contrast extravasation identified. Electronically Signed   By: Ulyses Southward M.D.   On: 03/26/2023 14:09    Anti-infectives: Anti-infectives (From admission, onward)    Start     Dose/Rate Route Frequency Ordered Stop   03/21/23 0600  piperacillin-tazobactam (ZOSYN) IVPB 3.375 g        3.375 g 12.5 mL/hr over 240 Minutes Intravenous Every 8 hours 03/21/23 0208     03/20/23 2300  piperacillin-tazobactam (ZOSYN) IVPB 3.375 g        3.375 g 100 mL/hr over 30 Minutes Intravenous  Once 03/20/23 2250 03/21/23 0018       Assessment/Plan: s/p Procedure(s): EXPLORATORY LAPAROTOMY GASTRORRHAPHY Impression: Stable on postoperative day 7.  Run of V. tach noted.  This is being addressed by the hospitalist.  Would take potassium oral supplementation with food given her history of peptic ulcer disease perforation.  Anticipate discharge in next 24 to 48 hours.  Today is last day of IV Zosyn.  H. pylori is negative.  LOS: 7 days    Miranda [name] 03/28/2023

## 2023-03-28 NOTE — TOC Progression Note (Addendum)
Transition of Care [facility]) - Progression Note    Patient Details  Name: Miranda [name] MRN: [id number] Date of Birth: 06-23-1934  [facility] of [facility]) CM/SW Contact  Villa Herb, [place] Phone Number: 03/28/2023, 11:59 AM  Clinical Narrative:    CSW updated by RN that pts son was requesting to speak with CSW. CSW spoke with son, he requests to be first contact as pt will be discharging to his home. CSW confirmed pt will be going to [address] [place], [place] [address]. Pts son would like to be contact for [facility] agency. CSW to update Morrie Sheldon with [facility]. CSW confirmed with pts son that she will need a rolling walker at D/C. Walker ordered through [employer]. TOC to follow.   Expected Discharge Plan: Home w Home Health Services Barriers to Discharge: Continued Medical Work up  Expected Discharge Plan and Services                                     [facility] Agency: [facility] Date [facility] Agency Contacted: 03/27/23 Time HH Agency Contacted: 1524 Representative spoke with at [facility] Agency: Morrie Sheldon   Social Determinants of Health (SDOH) Interventions SDOH Screenings   Food Insecurity: No Food Insecurity (03/21/2023)  Housing: Low Risk  (03/21/2023)  Transportation Needs: No Transportation Needs (03/21/2023)  Utilities: Not At Risk (03/21/2023)  Tobacco Use: Low Risk  (03/23/2023)    Readmission Risk Interventions     No data to display

## 2023-03-28 NOTE — Progress Notes (Signed)
Physical Therapy Treatment Patient Details Name: Miranda [name] MRN: [id number] DOB: 30-Oct-1934 Today's Date: 03/28/2023   History of Present Illness Miranda [name] is a 89 y.o. female with medical history significant of hypertension, hyperlipidemia who presents to the emergency department due to generalized abdominal pain which started this afternoon around 3 PM, she complained of nausea without vomiting and denies fever, chills, chest pain, shortness of breath, diarrhea.    PT Comments    Pt was sitting up in chair agreeable to participation with therapy today.  Pt able to stand from chair SBA and ambulated increased distance today of 200 feet with RW.  Pt without complaints of pain or fatigue during ambulation.   Pt voided upon return to room and completed all hygiene modified independently.  Pt returned to chair upon request.    Recommendations for follow up therapy are one component of a multi-disciplinary discharge planning process, led by the attending physician.  Recommendations may be updated based on patient status, additional functional criteria and insurance authorization.  Follow Up Recommendations       Assistance Recommended at Discharge Set up Supervision/Assistance  Patient can return home with the following A little help with bathing/dressing/bathroom;Help with stairs or ramp for entrance;Assistance with cooking/housework;A little help with walking and/or transfers   Equipment Recommendations  Rolling walker (2 wheels)    Recommendations for Other Services       Precautions / Restrictions Restrictions Weight Bearing Restrictions: No     Mobility  Bed Mobility                    Transfers Overall transfer level: Modified independent Equipment used: Rolling walker (2 wheels) Transfers: Sit to/from Stand, Bed to chair/wheelchair/BSC Sit to Stand: Supervision   Step pivot transfers: Min assist, Supervision       General transfer comment: slow  labored movement    Ambulation/Gait Ambulation/Gait assistance: Min guard Gait Distance (Feet): 200 Feet Assistive device: Rolling walker (2 wheels) Gait Pattern/deviations: Decreased step length - left, Decreased stance time - right, Decreased stride length Gait velocity: decreased     General Gait Details: no fatigue or pain expressed today        Cognition Arousal/Alertness: Awake/alert Behavior During Therapy: WFL for tasks assessed/performed Overall Cognitive Status: Within Functional Limits for tasks assessed                                                 Pertinent Vitals/Pain Pain Assessment Pain Assessment: No/denies pain     PT Goals (current goals can now be found in the care plan section)      Frequency    Min 3X/week      PT Plan  Continue per POC       AM-PAC PT "6 Clicks" Mobility   Outcome Measure  Help needed turning from your back to your side while in a flat bed without using bedrails?: A Little Help needed moving from lying on your back to sitting on the side of a flat bed without using bedrails?: A Little Help needed moving to and from a bed to a chair (including a wheelchair)?: A Little Help needed standing up from a chair using your arms (e.g., wheelchair or bedside chair)?: A Little Help needed to walk in hospital room?: A Little Help needed climbing 3-5 steps with a railing? :  A Lot 6 Click Score: 17    End of Session Equipment Utilized During Treatment: Gait belt Activity Tolerance: Patient tolerated treatment well;Patient limited by fatigue Patient left: in chair;with call bell/phone within reach;with family/visitor present Nurse Communication: Mobility status PT Visit Diagnosis: Unsteadiness on feet (R26.81);Other abnormalities of gait and mobility (R26.89);Muscle weakness (generalized) (M62.81)     Time: 4098-1191 PT Time Calculation (min) (ACUTE ONLY): 18 min  Charges:  $Gait Training: 8-22 mins                     Lurena Nida, PTA/CLT [facility] Ph: [phone]   Lurena Nida 03/28/2023, 3:23 PM

## 2023-03-28 NOTE — Progress Notes (Signed)
Mobility Specialist Progress Note:    03/28/23 1100  Mobility  Activity Ambulated with assistance in hallway  Level of Assistance Contact guard assist, steadying assist  Assistive Device Front wheel walker  Distance Ambulated (ft) 100 ft  Activity Response Tolerated well  Mobility Referral Yes  $Mobility charge 1 Mobility   Pt agreeable to mobility session. Tolerated well, asx throughout. Returned pt to chair, son in room, call bell and phone in reach. All needs met.   Feliciana Rossetti Mobility Specialist Please contact via Special educational needs teacher or  Rehab office at [phone]

## 2023-03-28 NOTE — Progress Notes (Signed)
PROGRESS NOTE    [name]  NWG:[id number] DOB: [birth year] DOA: 03/20/2023 PCP: Benita Stabile, MD   Brief Narrative:    Miranda [name] is a 89 y.o. female with medical history significant of hypertension, hyperlipidemia who presents to the emergency department due to generalized abdominal pain which started this afternoon around 3 PM, she complained of nausea without vomiting and denies fever, chills, chest pain, shortness of breath, diarrhea.  Patient was admitted with abdominal pain in the setting of perforated peptic ulcer and underwent surgical intervention with exploratory laparotomy and gastrorrhaphy.  Assessment & Plan:   Principal Problem:   Perforated viscus Active Problems:   Essential hypertension, benign   PSVT (paroxysmal supraventricular tachycardia)   Perforated peptic ulcer (HCC)   History of breast cancer   Hypotension due to hypovolemia  Assessment and Plan:   1-abdominal pain in the setting of perforated peptic ulcer -Status post surgical surgical intervention with exploratory laparotomy and gastrorrhaphy. -Continue IV PPI and full liquid diet will be advanced to heart healthy 5/1 -Will continue to follow recommendations by general surgery regarding postoperative care and diet advancement.   -Pending upper GI series evaluation to determine stability and wellbeing gastrorrhaphy. -WBCs within normal limits and no further low-grade fever appreciated. -Continue the use of Zosyn to complete a total of 7 days.   2-respiratory failure/ventilatory support -Patient went under ventilatory support after surgery -Completed good weaning trial and has been successfully extubated. -Demonstrating good oxygen saturation on room air at the moment -Continue flutter valve/incentive spirometer use.   3-essential hypertension/PSVT -Continue treatment with metoprolol (dose adjusted due to component of arrhythmia/tachycardia). -Continue as needed hydralazine. -Continue to  follow heart rate and vital signs. -Replete electrolytes for a potassium goal above 4 and magnesium above 2. -Stable and well-controlled currently. -Continue telemetry monitoring.   4-history of breast cancer -Planning to resume the use of anastrozole when able to fully tolerate by mouth.   5-hyperlipidemia -Planning to resume the use of Crestor when fully tolerating p.o.'s.   6-GERD -Continue IV PPI. -No signs of overt bleeding appreciated. -Continue to follow hemoglobin trend.   7-acute kidney injury on chronic renal failure-resolved -Patient with a stage IIIb at baseline -Acute distress, surgery and transient hypotension most likely participating in acute kidney injury component. -Continue to minimize nephrotoxic agent -Continue to maintain adequate hydration -Continue to follow renal function trend.   8-hyperphosphatemia/hypomagnesemia -Continue to maintain adequate hydration. -Improved/repleted and corrected. -Continue to follow electrolytes trend intermittently.   9-hypokalemia -Replete and reevaluate in a.m.  10-Obesity -Lifestyle changes outpatient, BMI 33.2   DVT prophylaxis:Lovenox Code Status: Full Family Communication: None at bedside Disposition Plan:  Status is: Inpatient Remains inpatient appropriate because: Need for IV medications.   Consultants:  General surgery  Procedures:  Exploratory laparotomy with gastrorrhaphy 4/24  Antimicrobials:  Anti-infectives (From admission, onward)    Start     Dose/Rate Route Frequency Ordered Stop   03/21/23 0600  piperacillin-tazobactam (ZOSYN) IVPB 3.375 g        3.375 g 12.5 mL/hr over 240 Minutes Intravenous Every 8 hours 03/21/23 0208 03/28/23 2359   03/20/23 2300  piperacillin-tazobactam (ZOSYN) IVPB 3.375 g        3.375 g 100 mL/hr over 30 Minutes Intravenous  Once 03/20/23 2250 03/21/23 0018      Subjective: Patient seen and evaluated today with no new acute complaints or concerns. No acute  concerns or events noted overnight.  She appears to be tolerating her liquid diet well.  Objective: Vitals:   03/27/23 2152 03/27/23 2335 03/28/23 0359 03/28/23 1333  BP: (!) 158/85 (!) 149/84 (!) 145/55 (!) 149/102  Pulse: (!) 46 92 67 83  Resp: 20  16 18   Temp: 98.6 F (37 C)  98.6 F (37 C) 98.1 F (36.7 C)  TempSrc: Oral   Oral  SpO2: 97%  98% 100%  Weight:      Height:        Intake/Output Summary (Last 24 hours) at 03/28/2023 1359 Last data filed at 03/28/2023 0900 Gross per 24 hour  Intake 240 ml  Output 5 ml  Net 235 ml   Filed Weights   03/23/23 0500 03/24/23 0500 03/25/23 0457  Weight: 66 kg 75 kg 77.1 kg    Examination:  General exam: Appears calm and comfortable  Respiratory system: Clear to auscultation. Respiratory effort normal. Cardiovascular system: S1 & S2 heard, RRR.  Gastrointestinal system: Abdomen is soft Central nervous system: Alert and awake Extremities: No edema Skin: No significant lesions noted Psychiatry: Flat affect.    Data Reviewed: I have personally reviewed following labs and imaging studies  CBC: Recent Labs  Lab 03/23/23 0453 03/24/23 0353 03/25/23 0529 03/26/23 0504 03/28/23 0334  WBC 10.7* 14.0* 10.5 7.7 6.6  HGB 10.9* 10.4* 11.6* 13.1 10.5*  HCT 33.3* 32.2* 36.5 39.8 31.5*  MCV 84.3 85.2 85.9 83.4 83.1  PLT 167 158 184 176 210   Basic Metabolic Panel: Recent Labs  Lab 03/22/23 0500 03/23/23 0453 03/24/23 0353 03/25/23 0529 03/26/23 0504 03/28/23 0334  NA 141 143 144 143 144 138  K 4.1 3.1* 3.9 3.1* 3.9 2.9*  CL 110 115* 117* 113* 111 107  CO2 20* 20* 22 22 23 24   GLUCOSE 128* 96 105* 118* 121* 114*  BUN 25* 30* 18 10 13 9   CREATININE 1.59* 1.36* 0.96 0.73 0.81 0.80  CALCIUM 7.2* 6.6* 7.7* 8.3* 8.7* 8.1*  MG 1.5* 2.0 2.1  --   --  1.7  PHOS 8.0* 5.7* 2.8  --   --   --    GFR: Estimated Creatinine Clearance: 45.4 mL/min (by C-G formula based on SCr of 0.8 mg/dL). Liver Function Tests: Recent Labs  Lab  03/24/23 0353 03/26/23 0504  AST 26 35  ALT 20 33  ALKPHOS 55 60  BILITOT 0.6 1.0  PROT 5.1* 6.2*  ALBUMIN 2.1* 2.4*   No results for input(s): "LIPASE", "AMYLASE" in the last 168 hours. No results for input(s): "AMMONIA" in the last 168 hours. Coagulation Profile: No results for input(s): "INR", "PROTIME" in the last 168 hours. Cardiac Enzymes: No results for input(s): "CKTOTAL", "CKMB", "CKMBINDEX", "TROPONINI" in the last 168 hours. BNP (last 3 results) No results for input(s): "PROBNP" in the last 8760 hours. HbA1C: No results for input(s): "HGBA1C" in the last 72 hours. CBG: Recent Labs  Lab 03/27/23 2149 03/28/23 0013 03/28/23 0356 03/28/23 0939 03/28/23 1208  GLUCAP 121* 120* 121* 147* 117*   Lipid Profile: No results for input(s): "CHOL", "HDL", "LDLCALC", "TRIG", "CHOLHDL", "LDLDIRECT" in the last 72 hours. Thyroid Function Tests: No results for input(s): "TSH", "T4TOTAL", "FREET4", "T3FREE", "THYROIDAB" in the last 72 hours. Anemia Panel: No results for input(s): "VITAMINB12", "FOLATE", "FERRITIN", "TIBC", "IRON", "RETICCTPCT" in the last 72 hours. Sepsis Labs: No results for input(s): "PROCALCITON", "LATICACIDVEN" in the last 168 hours.  Recent Results (from the past 240 hour(s))  Culture, blood (Routine X 2) w Reflex to ID Panel     Status: None   Collection Time: 03/21/23  2:35 AM   Specimen: BLOOD LEFT HAND  Result Value Ref Range Status   Specimen Description BLOOD LEFT HAND  Final   Special Requests   Final    BOTTLES DRAWN AEROBIC AND ANAEROBIC Blood Culture adequate volume   Culture   Final    NO GROWTH 5 DAYS Performed at [facility], [address] [place], [place] [address]    Report Status 03/26/2023 FINAL  Final  Culture, blood (Routine X 2) w Reflex to ID Panel     Status: None   Collection Time: 03/21/23  2:48 AM   Specimen: BLOOD LEFT HAND  Result Value Ref Range Status   Specimen Description BLOOD LEFT HAND  Final   Special Requests    Final    BOTTLES DRAWN AEROBIC AND ANAEROBIC Blood Culture adequate volume   Culture   Final    NO GROWTH 5 DAYS Performed at [facility], [address], [place], [place] [address]    Report Status 03/26/2023 FINAL  Final  Aerobic/Anaerobic Culture w Gram Stain (surgical/deep wound)     Status: None   Collection Time: 03/21/23  8:00 PM   Specimen: Path fluid; Body Fluid  Result Value Ref Range Status   Specimen Description   Final    FLUID Performed at [facility], [address] [place], [place] [address]    Special Requests   Final    NONE Performed at [facility], [address] [place], [place] [address]    Gram Stain   Final    RARE WBC PRESENT, PREDOMINANTLY MONONUCLEAR NO ORGANISMS SEEN    Culture   Final    No growth aerobically or anaerobically. Performed at [facility], [address] [place], [place] [address]    Report Status 03/27/2023 FINAL  Final  MRSA Next Gen by PCR, Nasal     Status: None   Collection Time: 03/21/23  8:47 PM   Specimen: Nasal Mucosa; Nasal Swab  Result Value Ref Range Status   MRSA by PCR Next Gen NOT DETECTED NOT DETECTED Final    Comment: (NOTE) The GeneXpert MRSA Assay (FDA approved for NASAL specimens only), is one component of a comprehensive MRSA colonization surveillance program. It is not intended to diagnose MRSA infection nor to guide or monitor treatment for MRSA infections. Test performance is not FDA approved in patients less than 68 years old. Performed at [facility], [address], [place], [place] [address]          Radiology Studies: No results found.      Scheduled Meds:  bisacodyl  10 mg Rectal Daily   Chlorhexidine Gluconate Cloth  6 each Topical Daily   enoxaparin (LOVENOX) injection  40 mg Subcutaneous Q24H   metoprolol tartrate  10 mg Intravenous Q6H   pantoprazole (PROTONIX) IV  40 mg Intravenous QHS   potassium chloride  40 mEq Oral BID   Continuous Infusions:  dextrose 5 % and  0.45 % NaCl with KCl 40 mEq/L 10 mL/hr at 03/28/23 1142   piperacillin-tazobactam (ZOSYN)  IV 3.375 g (03/28/23 1351)     LOS: 7 days    Time spent: 35 minutes    Pheobe Sandiford Hoover Brunette, DO [facility]  If 7PM-7AM, please contact night-coverage [email or web address] 03/28/2023, 1:59 PM

## 2023-03-28 NOTE — Progress Notes (Signed)
Per pharmacy go ahead and give Zosyn per order expiring a few minutes ago this is the last dose.

## 2023-03-29 DIAGNOSIS — R198 Other specified symptoms and signs involving the digestive system and abdomen: Secondary | ICD-10-CM | POA: Diagnosis not present

## 2023-03-29 LAB — BASIC METABOLIC PANEL
Anion gap: 7 (ref 5–15)
BUN: 6 mg/dL — ABNORMAL LOW (ref 8–23)
CO2: 25 mmol/L (ref 22–32)
Calcium: 8 mg/dL — ABNORMAL LOW (ref 8.9–10.3)
Chloride: 105 mmol/L (ref 98–111)
Creatinine, Ser: 0.75 mg/dL (ref 0.44–1.00)
GFR, Estimated: >60 mL/min (ref >60)
Glucose, Bld: 102 mg/dL — ABNORMAL HIGH (ref 70–99)
Potassium: 3.1 mmol/L — ABNORMAL LOW (ref 3.5–5.1)
Sodium: 137 mmol/L (ref 135–145)

## 2023-03-29 LAB — CBC
HCT: 33 % — ABNORMAL LOW (ref 36.0–46.0)
Hemoglobin: 10.7 g/dL — ABNORMAL LOW (ref 12.0–15.0)
MCH: 27.4 pg (ref 26.0–34.0)
MCHC: 32.4 g/dL (ref 30.0–36.0)
MCV: 84.4 fL (ref 80.0–100.0)
Platelets: 238 10*3/uL (ref 150–400)
RBC: 3.91 MIL/uL (ref 3.87–5.11)
RDW: 14.6 % (ref 11.5–15.5)
WBC: 8.3 10*3/uL (ref 4.0–10.5)
nRBC: 0 % (ref 0.0–0.2)

## 2023-03-29 LAB — GLUCOSE, CAPILLARY
Glucose-Capillary: 101 mg/dL — ABNORMAL HIGH (ref 70–99)
Glucose-Capillary: 105 mg/dL — ABNORMAL HIGH (ref 70–99)
Glucose-Capillary: 113 mg/dL — ABNORMAL HIGH (ref 70–99)
Glucose-Capillary: 121 mg/dL — ABNORMAL HIGH (ref 70–99)

## 2023-03-29 LAB — MAGNESIUM: Magnesium: 1.7 mg/dL (ref 1.7–2.4)

## 2023-03-29 MED ORDER — OXYCODONE HCL 5 MG PO TABS: 5.0000 mg | ORAL_TABLET | Freq: Three times a day (TID) | ORAL | 0 refills | Status: DC | PRN

## 2023-03-29 MED ORDER — POTASSIUM CHLORIDE CRYS ER 20 MEQ PO TBCR
40.0000 meq | EXTENDED_RELEASE_TABLET | Freq: Two times a day (BID) | ORAL | Status: DC
  Administered 2023-03-29: 40 meq via ORAL
  Filled 2023-03-29: qty 2

## 2023-03-29 MED ORDER — PANTOPRAZOLE SODIUM 40 MG PO TBEC: 40.0000 mg | DELAYED_RELEASE_TABLET | Freq: Every day | ORAL | 1 refills | Status: AC

## 2023-03-29 NOTE — Discharge Summary (Signed)
Physician Discharge Summary  Miranda [name] ZOX:[id number] DOB: 1934/04/23 DOA: 03/20/2023  PCP: Benita Stabile, MD  Admit date: 03/20/2023  Discharge date: 03/29/2023  Admitted From:Home  Disposition:  Home  Recommendations for Outpatient Follow-up:  Follow up with PCP in 1-2 weeks Follow-up with Dr. Lovell Sheehan outpatient as schedule 5/7 Continue on potassium supplementation as previously prescribed and recheck BMP in 1 week; encouraged to take supplementation with food Continue PPI as prescribed Continue other home medications as prior  Home Health: Yes with PT  Equipment/Devices: None  Discharge Condition:Stable  CODE STATUS: Full  Diet recommendation: Heart Healthy  Brief/Interim Summary: Miranda [name] is a 89 y.o. female with medical history significant of hypertension, hyperlipidemia who presents to the emergency department due to generalized abdominal pain which started this afternoon around 3 PM, she complained of nausea without vomiting and denies fever, chills, chest pain, shortness of breath, diarrhea.  Patient was admitted with abdominal pain in the setting of perforated peptic ulcer and underwent surgical intervention with exploratory laparotomy and gastrorrhaphy on 4/24.  Her diet was slowly advanced and she was maintained on IV Zosyn as well as IV fluid and potassium supplementation for several days after this procedure.  She is now adequately tolerating diet and is having bowel movements with adequate pain control and is in stable condition for discharge.  She will follow-up with general surgery outpatient and remain on PPI as well as potassium supplementation as previously prescribed.  No other acute events or concerns noted.  Discharge Diagnoses:  Principal Problem:   Perforated viscus Active Problems:   Essential hypertension, benign   PSVT (paroxysmal supraventricular tachycardia)   Perforated peptic ulcer (HCC)   History of breast cancer   Hypotension due to  hypovolemia  Principal discharge diagnosis: Perforated peptic ulcer status post exploratory laparotomy and gastrorrhaphy 4/24.  Discharge Instructions  Discharge Instructions     Diet - low sodium heart healthy   Complete by: As directed    Increase activity slowly   Complete by: As directed       Allergies as of 03/29/2023       Reactions   Bee Venom Swelling        Medication List     TAKE these medications    acetaminophen 650 MG CR tablet Commonly known as: TYLENOL Take 650 mg by mouth daily as needed for pain.   anastrozole 1 MG tablet Commonly known as: ARIMIDEX Take 1 tablet (1 mg total) by mouth daily.   aspirin EC 81 MG tablet Take 81 mg by mouth daily.   gabapentin 100 MG capsule Commonly known as: NEURONTIN TAKE 1 CAPSULE BY MOUTH THREE TIMES A DAY   ibuprofen 200 MG tablet Commonly known as: ADVIL Take 200 mg by mouth daily as needed for headache or moderate pain.   latanoprost 0.005 % ophthalmic solution Commonly known as: XALATAN Place 1 drop into both eyes at bedtime.   metoprolol tartrate 25 MG tablet Commonly known as: LOPRESSOR Take 25 mg by mouth daily.   oxyCODONE 5 MG immediate release tablet Commonly known as: Roxicodone Take 1 tablet (5 mg total) by mouth every 8 (eight) hours as needed for moderate pain, severe pain or breakthrough pain.   pantoprazole 40 MG tablet Commonly known as: Protonix Take 1 tablet (40 mg total) by mouth daily.   potassium chloride SA 20 MEQ tablet Commonly known as: KLOR-CON M Take 20-40 mEq by mouth See admin instructions. 40 mEq in the morning, 20 mEq  in the evening.   REFRESH OP Place 1 drop into both eyes daily as needed (dry eyes).   rosuvastatin 10 MG tablet Commonly known as: CRESTOR Take 10 mg by mouth daily.   telmisartan 80 MG tablet Commonly known as: MICARDIS Take 80 mg by mouth daily.   timolol 0.5 % ophthalmic solution Commonly known as: TIMOPTIC Place 1 drop into both eyes  daily.   torsemide 20 MG tablet Commonly known as: DEMADEX Take 20 mg by mouth daily as needed (swelling).   VITAMIN D-3 PO Take 1 capsule by mouth daily.               Durable Medical Equipment  (From admission, onward)           Start     Ordered   03/26/23 1550  For home use only DME Walker rolling  Once       Comments: Patient at high risk for falls due to weakness and requires the use of a RW for safety.  Question Answer Comment  Walker: With 5 Inch Wheels   Patient needs a walker to treat with the following condition Gait difficulty      03/26/23 1550            Follow-up Information     Franky Macho, MD. Schedule an appointment as soon as possible for a visit on 04/03/2023.   Specialty: General Surgery Why: For staple removal Contact information: [address] [place] [address] [phone]         Benita Stabile, MD. Schedule an appointment as soon as possible for a visit in 1 week(s).   Specialty: Internal Medicine Contact information: [address] [place] [address] [phone]                Allergies  Allergen Reactions   Bee Venom Swelling    Consultations: General surgery   Procedures/Studies: DG UGI W SINGLE CM (SOL OR THIN BA)  Result Date: 03/26/2023 CLINICAL DATA:  Perforated gastric ulcer anterior pyloric region post [name] patch, postoperative evaluation EXAM: WATER SOLUBLE UPPER GI SERIES TECHNIQUE: Single-column upper GI series was performed using water soluble contrast. Radiation Exposure Index (as provided by the fluoroscopic device): 50.6 mGy Kerma CONTRAST:  Gastrografin COMPARISON:  CT abdomen and pelvis 03/20/2023 FLUOROSCOPY: Fluoroscopy Time:  2 minutes 18 seconds FINDINGS: Positioning supine, RIGHT lateral, and prone. Normal gastric distension. Wall thickening at the pyloric region consistent with edema related to ulcer and surgery. Contrast however passes into the duodenal sweep and  proximal small bowel loops without obstruction. Surgical drain noted in the RIGHT upper abdomen. No extravasation of contrast is seen at the site of surgical repair nor along the surgical drain to suggest continued leak post surgery. IMPRESSION: Distal gastric wall edema consistent with ulcer and prior surgery. No contrast extravasation identified. Electronically Signed   By: Ulyses Southward M.D.   On: 03/26/2023 14:09   DG Chest Port 1 View  Result Date: 03/23/2023 CLINICAL DATA:  Provided history: Encounter for intubation. Status post laparotomy for perforated viscus. EXAM: PORTABLE CHEST 1 VIEW COMPARISON:  Prior chest radiographs 03/21/2023 and earlier. FINDINGS: ET tube present with tip at the level of the clavicular heads. An enteric tube passes below the level of the left hemidiaphragm and terminates outside of the field of view. Borderline cardiomegaly. Aortic atherosclerosis. Hazy opacity within the mid and lower right lung which may reflect atelectasis, a pleural effusion and/or pneumonia. Ill-defined opacity within the medial  left lung base which may reflect atelectasis and/or pneumonia. No definite left pleural effusion. No evidence of pneumothorax. No acute osseous abnormality identified. Degenerative changes of the spine. IMPRESSION: 1. Support apparatus as described. 2. Persistent hazy opacity within the mid and lower right lung which may reflect atelectasis, a pleural effusion and/or pneumonia. 3. Persistent opacity within the medial left lung base, which may reflect atelectasis and/or pneumonia. 4. Borderline cardiomegaly. 5.  Aortic Atherosclerosis (ICD10-I70.0). Electronically Signed   By: Jackey Loge D.O.   On: 03/23/2023 08:10   DG CHEST PORT 1 VIEW  Result Date: 03/21/2023 CLINICAL DATA:  Nasogastric tube placement. Status post exploratory laparotomy for perforated viscus. EXAM: PORTABLE CHEST 1 VIEW COMPARISON:  11/29/2018. FINDINGS: The heart is enlarged and the mediastinal contour is  stable. There is atherosclerotic calcification of the aorta. Surgical clips are present at the left hilum. Opacities are noted in the mid to lower right lung and left lung base. There are small bilateral pleural effusions. No pneumothorax. The endotracheal tube terminates 4.1 cm above the carina. An enteric tube courses over the left upper quadrant and out of the field of view. IMPRESSION: 1. Airspace disease in the mid to lower right lung and left lung base, possible atelectasis, edema, or infiltrate. 2. Small bilateral pleural effusions. 3. Support apparatus as described above. Electronically Signed   By: Thornell Sartorius M.D.   On: 03/21/2023 21:12   CT ABDOMEN PELVIS W CONTRAST  Result Date: 03/20/2023 CLINICAL DATA:  Generalized abdominal pain EXAM: CT ABDOMEN AND PELVIS WITH CONTRAST TECHNIQUE: Multidetector CT imaging of the abdomen and pelvis was performed using the standard protocol following bolus administration of intravenous contrast. RADIATION DOSE REDUCTION: This exam was performed according to the departmental dose-optimization program which includes automated exposure control, adjustment of the mA and/or kV according to patient size and/or use of iterative reconstruction technique. CONTRAST:  75mL OMNIPAQUE IOHEXOL 300 MG/ML  SOLN COMPARISON:  03/22/2017 FINDINGS: Lower chest: Trace right pleural effusion. No acute airspace disease. The heart is enlarged, with calcifications of the aortic and mitral valves. No pericardial effusion. Hepatobiliary: Either adherent 3 mm gallbladder calculus or mural calcification within the gallbladder fundus. No evidence of acute cholecystitis. The liver is unremarkable. Pancreas: Unremarkable. No pancreatic ductal dilatation or surrounding inflammatory changes. Spleen: Normal in size without focal abnormality. Adrenals/Urinary Tract: The kidneys enhance normally. No urinary tract calculi or obstructive uropathy. The adrenals are unremarkable. The bladder is minimally  distended, which limits its evaluation. Stomach/Bowel: Pneumoperitoneum is identified, greatest within the right hemiabdomen. Findings are consistent with perforated viscus. Extraluminal gas and fluid track along the right anterior abdomen to the region of the cecum and ascending colon, suspicious for ascending colonic perforation. Surgical consultation is recommended. There is segmental dilation of jejunal loops within the left mid abdomen, measuring up to 3 cm in diameter, favor regional ileus. No evidence of bowel obstruction. Distal colon is decompressed. Moderate retained stool within the rectal vault could reflect fecal impaction. Vascular/Lymphatic: Aortic atherosclerosis. No enlarged abdominal or pelvic lymph nodes. Reproductive: Status post hysterectomy. No adnexal masses. Other: Pneumoperitoneum and free fluid within the abdomen as above, greatest in the anterior right hemiabdomen. No loculated fluid collection or evidence of abscess at this time. Small fat containing right inguinal hernia. Musculoskeletal: No acute or destructive bony lesions. Reconstructed images demonstrate no additional findings. IMPRESSION: 1. Findings compatible with perforated viscus, with free fluid and free intraperitoneal gas tracking within the right anterior hemiabdomen along the cecum and ascending colon,  suspicious for colonic perforation. Surgical consultation is recommended. 2. Segmental jejunal dilation within the left mid abdomen, favor regional ileus. 3. Retained stool within the rectal vault suspicious for fecal impaction. 4. Trace right pleural effusion. 5. Adherent 3 mm gallbladder calculus versus focal mural calcification. No evidence of acute cholecystitis. 6.  Aortic Atherosclerosis (ICD10-I70.0). Critical Value/emergent results were called by telephone at the time of interpretation on 03/20/2023 at 10:32 pm to provider [facility] ZAMMIT , who verbally acknowledged these results. Electronically Signed   By: Sharlet Salina M.D.   On: 03/20/2023 22:33     Discharge Exam: Vitals:   03/28/23 2358 03/29/23 0514  BP: (!) 169/93 (!) 156/72  Pulse: 88 76  Resp:  18  Temp:  99.1 F (37.3 C)  SpO2: 94% 96%   Vitals:   03/28/23 1800 03/28/23 2100 03/28/23 2358 03/29/23 0514  BP: (!) 154/70 (!) 174/99 (!) 169/93 (!) 156/72  Pulse: 96 98 88 76  Resp: 18 18  18   Temp:  99.2 F (37.3 C)  99.1 F (37.3 C)  TempSrc:  Oral  Oral  SpO2: 97% 100% 94% 96%  Weight:      Height:        General: Pt is alert, awake, not in acute distress Cardiovascular: RRR, S1/S2 +, no rubs, no gallops Respiratory: CTA bilaterally, no wheezing, no rhonchi Abdominal: Soft, NT, ND, bowel sounds + Extremities: no edema, no cyanosis    The results of significant diagnostics from this hospitalization (including imaging, microbiology, ancillary and laboratory) are listed below for reference.     Microbiology: Recent Results (from the past 240 hour(s))  Culture, blood (Routine X 2) w Reflex to ID Panel     Status: None   Collection Time: 03/21/23  2:35 AM   Specimen: BLOOD LEFT HAND  Result Value Ref Range Status   Specimen Description BLOOD LEFT HAND  Final   Special Requests   Final    BOTTLES DRAWN AEROBIC AND ANAEROBIC Blood Culture adequate volume   Culture   Final    NO GROWTH 5 DAYS Performed at [facility], [address] [place], [place] [address]    Report Status 03/26/2023 FINAL  Final  Culture, blood (Routine X 2) w Reflex to ID Panel     Status: None   Collection Time: 03/21/23  2:48 AM   Specimen: BLOOD LEFT HAND  Result Value Ref Range Status   Specimen Description BLOOD LEFT HAND  Final   Special Requests   Final    BOTTLES DRAWN AEROBIC AND ANAEROBIC Blood Culture adequate volume   Culture   Final    NO GROWTH 5 DAYS Performed at [facility], [address] [place], [place] [address]    Report Status 03/26/2023 FINAL  Final  Aerobic/Anaerobic Culture w Gram Stain (surgical/deep wound)      Status: None   Collection Time: 03/21/23  8:00 PM   Specimen: Path fluid; Body Fluid  Result Value Ref Range Status   Specimen Description   Final    FLUID Performed at [facility], [address] [place], [place] [address]    Special Requests   Final    NONE Performed at [facility], [address] [place], [place] [address]    Gram Stain   Final    RARE WBC PRESENT, PREDOMINANTLY MONONUCLEAR NO ORGANISMS SEEN    Culture   Final    No growth aerobically or anaerobically. Performed at [facility], [address], [place], [place] [address]    Report Status 03/27/2023 FINAL  Final  MRSA Next Gen by PCR, Nasal     Status: None   Collection Time: 03/21/23  8:47 PM   Specimen: Nasal Mucosa; Nasal Swab  Result Value Ref Range Status   MRSA by PCR Next Gen NOT DETECTED NOT DETECTED Final    Comment: (NOTE) The GeneXpert MRSA Assay (FDA approved for NASAL specimens only), is one component of a comprehensive MRSA colonization surveillance program. It is not intended to diagnose MRSA infection nor to guide or monitor treatment for MRSA infections. Test performance is not FDA approved in patients less than 67 years old. Performed at [facility], [address], [place], [place] [address]      Labs: BNP (last 3 results) No results for input(s): "BNP" in the last 8760 hours. Basic Metabolic Panel: Recent Labs  Lab 03/23/23 0453 03/24/23 0353 03/25/23 0529 03/26/23 0504 03/28/23 0334 03/29/23 0429  NA 143 144 143 144 138 137  K 3.1* 3.9 3.1* 3.9 2.9* 3.1*  CL 115* 117* 113* 111 107 105  CO2 20* 22 22 23 24 25   GLUCOSE 96 105* 118* 121* 114* 102*  BUN 30* 18 10 13 9  6*  CREATININE 1.36* 0.96 0.73 0.81 0.80 0.75  CALCIUM 6.6* 7.7* 8.3* 8.7* 8.1* 8.0*  MG 2.0 2.1  --   --  1.7 1.7  PHOS 5.7* 2.8  --   --   --   --    Liver Function Tests: Recent Labs  Lab 03/24/23 0353 03/26/23 0504  AST 26 35  ALT 20 33  ALKPHOS 55 60  BILITOT 0.6 1.0  PROT 5.1* 6.2*   ALBUMIN 2.1* 2.4*   No results for input(s): "LIPASE", "AMYLASE" in the last 168 hours. No results for input(s): "AMMONIA" in the last 168 hours. CBC: Recent Labs  Lab 03/24/23 0353 03/25/23 0529 03/26/23 0504 03/28/23 0334 03/29/23 0429  WBC 14.0* 10.5 7.7 6.6 8.3  HGB 10.4* 11.6* 13.1 10.5* 10.7*  HCT 32.2* 36.5 39.8 31.5* 33.0*  MCV 85.2 85.9 83.4 83.1 84.4  PLT 158 184 176 210 238   Cardiac Enzymes: No results for input(s): "CKTOTAL", "CKMB", "CKMBINDEX", "TROPONINI" in the last 168 hours. BNP: Invalid input(s): "POCBNP" CBG: Recent Labs  Lab 03/28/23 1208 03/28/23 2050 03/29/23 0017 03/29/23 0505 03/29/23 0736  GLUCAP 117* 131* 113* 101* 105*   D-Dimer No results for input(s): "DDIMER" in the last 72 hours. Hgb A1c No results for input(s): "HGBA1C" in the last 72 hours. Lipid Profile No results for input(s): "CHOL", "HDL", "LDLCALC", "TRIG", "CHOLHDL", "LDLDIRECT" in the last 72 hours. Thyroid function studies No results for input(s): "TSH", "T4TOTAL", "T3FREE", "THYROIDAB" in the last 72 hours.  Invalid input(s): "FREET3" Anemia work up No results for input(s): "VITAMINB12", "FOLATE", "FERRITIN", "TIBC", "IRON", "RETICCTPCT" in the last 72 hours. Urinalysis    Component Value Date/Time   COLORURINE YELLOW 03/22/2017 1020   APPEARANCEUR HAZY (A) 03/22/2017 1020   LABSPEC 1.015 03/22/2017 1020   PHURINE 8.0 03/22/2017 1020   GLUCOSEU NEGATIVE 03/22/2017 1020   HGBUR MODERATE (A) 03/22/2017 1020   HGBUR large 03/24/2008 0812   BILIRUBINUR NEGATIVE 03/22/2017 1020   KETONESUR NEGATIVE 03/22/2017 1020   PROTEINUR 30 (A) 03/22/2017 1020   UROBILINOGEN 0.2 03/24/2008 0812   NITRITE POSITIVE (A) 03/22/2017 1020   LEUKOCYTESUR LARGE (A) 03/22/2017 1020   Sepsis Labs Recent Labs  Lab 03/25/23 0529 03/26/23 0504 03/28/23 0334 03/29/23 0429  WBC 10.5 7.7  6.6 8.3   Microbiology Recent Results (from the past 240 hour(s))  Culture, blood (Routine X 2)  w Reflex to ID Panel     Status: None   Collection Time: 03/21/23  2:35 AM   Specimen: BLOOD LEFT HAND  Result Value Ref Range Status   Specimen Description BLOOD LEFT HAND  Final   Special Requests   Final    BOTTLES DRAWN AEROBIC AND ANAEROBIC Blood Culture adequate volume   Culture   Final    NO GROWTH 5 DAYS Performed at [facility], [address], [place], [place] [address]    Report Status 03/26/2023 FINAL  Final  Culture, blood (Routine X 2) w Reflex to ID Panel     Status: None   Collection Time: 03/21/23  2:48 AM   Specimen: BLOOD LEFT HAND  Result Value Ref Range Status   Specimen Description BLOOD LEFT HAND  Final   Special Requests   Final    BOTTLES DRAWN AEROBIC AND ANAEROBIC Blood Culture adequate volume   Culture   Final    NO GROWTH 5 DAYS Performed at [facility], [address], [place], [place] [address]    Report Status 03/26/2023 FINAL  Final  Aerobic/Anaerobic Culture w Gram Stain (surgical/deep wound)     Status: None   Collection Time: 03/21/23  8:00 PM   Specimen: Path fluid; Body Fluid  Result Value Ref Range Status   Specimen Description   Final    FLUID Performed at [facility], [address] [place], [place] [address]    Special Requests   Final    NONE Performed at [facility], [address], [place], [place] [address]    Gram Stain   Final    RARE WBC PRESENT, PREDOMINANTLY MONONUCLEAR NO ORGANISMS SEEN    Culture   Final    No growth aerobically or anaerobically. Performed at [facility], [address] [place], [place] [address]    Report Status 03/27/2023 FINAL  Final  MRSA Next Gen by PCR, Nasal     Status: None   Collection Time: 03/21/23  8:47 PM   Specimen: Nasal Mucosa; Nasal Swab  Result Value Ref Range Status   MRSA by PCR Next Gen NOT DETECTED NOT DETECTED Final    Comment: (NOTE) The GeneXpert MRSA Assay (FDA approved for NASAL specimens only), is one component of a comprehensive MRSA colonization  surveillance program. It is not intended to diagnose MRSA infection nor to guide or monitor treatment for MRSA infections. Test performance is not FDA approved in patients less than 1 years old. Performed at [facility], [address], [place], [place] [address]      Time coordinating discharge: 35 minutes  SIGNED:   Erick Blinks, DO [facility] 03/29/2023, 8:55 AM  If 7PM-7AM, please contact night-coverage [email or web address]

## 2023-03-29 NOTE — Care Management Important Message (Signed)
Important Message  Patient Details  Name: Miranda [name] MRN: [id number] Date of Birth: [birth year]   Medicare Important Message Given:  Yes     Corey Harold 03/29/2023, 11:26 AM

## 2023-03-29 NOTE — Progress Notes (Signed)
Discharge information reviewed, pt and family state understanding. Pt discharged via WC to POV.

## 2023-03-29 NOTE — Plan of Care (Signed)
  Problem: Education: Goal: Knowledge of General Education information will improve Description: Including pain rating scale, medication(s)/side effects and non-pharmacologic comfort measures Outcome: Adequate for Discharge   Problem: Health Behavior/Discharge Planning: Goal: Ability to manage health-related needs will improve Outcome: Adequate for Discharge   Problem: Clinical Measurements: Goal: Ability to maintain clinical measurements within normal limits will improve Outcome: Adequate for Discharge Goal: Will remain free from infection Outcome: Adequate for Discharge Goal: Diagnostic test results will improve Outcome: Adequate for Discharge Goal: Respiratory complications will improve Outcome: Adequate for Discharge Goal: Cardiovascular complication will be avoided Outcome: Adequate for Discharge   Problem: Activity: Goal: Risk for activity intolerance will decrease Outcome: Adequate for Discharge   Problem: Nutrition: Goal: Adequate nutrition will be maintained Outcome: Adequate for Discharge   Problem: Coping: Goal: Level of anxiety will decrease Outcome: Adequate for Discharge   Problem: Elimination: Goal: Will not experience complications related to bowel motility Outcome: Adequate for Discharge Goal: Will not experience complications related to urinary retention Outcome: Adequate for Discharge   Problem: Pain Managment: Goal: General experience of comfort will improve Outcome: Adequate for Discharge   Problem: Safety: Goal: Ability to remain free from injury will improve Outcome: Adequate for Discharge   Problem: Skin Integrity: Goal: Risk for impaired skin integrity will decrease Outcome: Adequate for Discharge   Problem: Acute Rehab PT Goals(only PT should resolve) Goal: Pt Will Go Supine/Side To Sit Outcome: Adequate for Discharge Goal: Patient Will Transfer Sit To/From Stand Outcome: Adequate for Discharge Goal: Pt Will Transfer Bed To Chair/Chair  To Bed Outcome: Adequate for Discharge Goal: Pt Will Ambulate Outcome: Adequate for Discharge   

## 2023-03-29 NOTE — Progress Notes (Signed)
8 Days Post-Op  Subjective: No complaints  Objective: Vital signs in last 24 hours: Temp:  [98.1 F (36.7 C)-99.2 F (37.3 C)] 99.1 F (37.3 C) (05/02 0514) Pulse Rate:  [76-98] 76 (05/02 0514) Resp:  [18] 18 (05/02 0514) BP: (149-174)/(70-102) 156/72 (05/02 0514) SpO2:  [94 %-100 %] 96 % (05/02 0514) Last BM Date : 03/28/23  Intake/Output from previous day: 05/01 0701 - 05/02 0700 In: 960 [P.O.:960] Out: 50 [Drains:50] Intake/Output this shift: No intake/output data recorded.  General appearance: alert, cooperative, and no distress GI: Soft, incision healing well.  JP drain removed.  Lab Results:  Recent Labs    03/28/23 0334 03/29/23 0429  WBC 6.6 8.3  HGB 10.5* 10.7*  HCT 31.5* 33.0*  PLT 210 238   BMET Recent Labs    03/28/23 0334 03/29/23 0429  NA 138 137  K 2.9* 3.1*  CL 107 105  CO2 24 25  GLUCOSE 114* 102*  BUN 9 6*  CREATININE 0.80 0.75  CALCIUM 8.1* 8.0*   PT/INR No results for input(s): "LABPROT", "INR" in the last 72 hours.  Studies/Results: No results found.  Anti-infectives: Anti-infectives (From admission, onward)    Start     Dose/Rate Route Frequency Ordered Stop   03/21/23 0600  piperacillin-tazobactam (ZOSYN) IVPB 3.375 g        3.375 g 12.5 mL/hr over 240 Minutes Intravenous Every 8 hours 03/21/23 0208 03/29/23 0230   03/20/23 2300  piperacillin-tazobactam (ZOSYN) IVPB 3.375 g        3.375 g 100 mL/hr over 30 Minutes Intravenous  Once 03/20/23 2250 03/21/23 0018       Assessment/Plan: s/p Procedure(s): EXPLORATORY LAPAROTOMY GASTRORRHAPHY Impression: Stable on postoperative day 8.  Tolerating diet fairly well.  Potassium still low but improved from yesterday.  Okay for discharge from surgery standpoint.  Recommend outpatient oral potassium therapy with food.  Patient should be discharged on a PPI.  I will see the patient in my office next week for wound check.  Discussed with Dr. Sherryll Burger.  LOS: 8 days    Miranda [name] 03/29/2023

## 2023-03-30 DIAGNOSIS — Z87442 Personal history of urinary calculi: Secondary | ICD-10-CM | POA: Diagnosis not present

## 2023-03-30 DIAGNOSIS — Z48815 Encounter for surgical aftercare following surgery on the digestive system: Secondary | ICD-10-CM | POA: Diagnosis not present

## 2023-03-30 DIAGNOSIS — I959 Hypotension, unspecified: Secondary | ICD-10-CM | POA: Diagnosis not present

## 2023-03-30 DIAGNOSIS — I4719 Other supraventricular tachycardia: Secondary | ICD-10-CM | POA: Diagnosis not present

## 2023-03-30 DIAGNOSIS — J45909 Unspecified asthma, uncomplicated: Secondary | ICD-10-CM | POA: Diagnosis not present

## 2023-03-30 DIAGNOSIS — Z7982 Long term (current) use of aspirin: Secondary | ICD-10-CM | POA: Diagnosis not present

## 2023-03-30 DIAGNOSIS — Z9049 Acquired absence of other specified parts of digestive tract: Secondary | ICD-10-CM | POA: Diagnosis not present

## 2023-03-30 DIAGNOSIS — E782 Mixed hyperlipidemia: Secondary | ICD-10-CM | POA: Diagnosis not present

## 2023-03-30 DIAGNOSIS — I10 Essential (primary) hypertension: Secondary | ICD-10-CM | POA: Diagnosis not present

## 2023-03-30 DIAGNOSIS — Z853 Personal history of malignant neoplasm of breast: Secondary | ICD-10-CM | POA: Diagnosis not present

## 2023-04-03 ENCOUNTER — Encounter: Payer: Self-pay | Admitting: General Surgery

## 2023-04-03 ENCOUNTER — Ambulatory Visit (INDEPENDENT_AMBULATORY_CARE_PROVIDER_SITE_OTHER): Payer: Medicare Other | Admitting: General Surgery

## 2023-04-03 VITALS — BP 163/97 | HR 82 | Temp 98.1°F | Resp 12 | Ht 60.0 in | Wt 156.0 lb

## 2023-04-03 DIAGNOSIS — Z09 Encounter for follow-up examination after completed treatment for conditions other than malignant neoplasm: Secondary | ICD-10-CM

## 2023-04-03 NOTE — Progress Notes (Addendum)
Subjective:     Miranda [name]  Is here for postoperative visit, status post Miranda [name] plication for perforated peptic ulcer disease.  She did test negative for H. pylori.  She has been doing well.  Her appetite has improved.  She did have some bilateral leg swelling at discharge, but she was started on a fluid pill and she states her leg swelling has improved.  She is going to see her primary care physician in the near future. Objective:    BP (!) 163/97   Pulse 82   Temp 98.1 F (36.7 C) (Oral)   Resp 12   Ht 5' (1.524 m)   Wt 156 lb (70.8 kg)   SpO2 95%   BMI 30.47 kg/m   General:  alert, cooperative, and no distress  Abdomen soft, incision healing well.  Staples removed, Steri-Strips applied.     Assessment:    Doing well postoperatively.    Plan:   I told the patient that she should take her potassium supplementation with a meal.  She was also told to limit her ibuprofen intake given her recent ulceration.  I suspect this was the etiology in combination with her potassium supplementation that caused her peptic ulcer to develop.  There was no evidence of primary malignancy.  She is currently asymptomatic and has no epigastric pain or reflux.  Follow-up here as needed.  Continue Protonix as prescribed.

## 2023-04-04 ENCOUNTER — Encounter: Payer: Self-pay | Admitting: *Deleted

## 2023-04-04 ENCOUNTER — Telehealth: Payer: Self-pay | Admitting: Hematology and Oncology

## 2023-04-04 DIAGNOSIS — J45909 Unspecified asthma, uncomplicated: Secondary | ICD-10-CM | POA: Diagnosis not present

## 2023-04-04 DIAGNOSIS — I10 Essential (primary) hypertension: Secondary | ICD-10-CM | POA: Diagnosis not present

## 2023-04-04 DIAGNOSIS — I959 Hypotension, unspecified: Secondary | ICD-10-CM | POA: Diagnosis not present

## 2023-04-04 DIAGNOSIS — Z48815 Encounter for surgical aftercare following surgery on the digestive system: Secondary | ICD-10-CM | POA: Diagnosis not present

## 2023-04-04 DIAGNOSIS — E782 Mixed hyperlipidemia: Secondary | ICD-10-CM | POA: Diagnosis not present

## 2023-04-04 DIAGNOSIS — I4719 Other supraventricular tachycardia: Secondary | ICD-10-CM | POA: Diagnosis not present

## 2023-04-04 NOTE — Progress Notes (Signed)
Pt attended screening event on 03/13/2023 in [facility] for [employer] event, where Pt screening result was 179/74. Pt confirmed Dr. Kathleene Hazel, Margo Aye as her PCP and did not indicate any SDOH insecurities at the event. Per chart review, last PCP visit visible in [facility] was 08/29/2021. Per chart review also, pt had post -surgical exam on 04/03/23 where b/p was 163/97and surgeon copied his notes to pt's PCP.  Pt was contacted by phone for BP follow up. During the call, Pt was advised to contact her PCP to inform them about her BP screening result. Pt informed the caller that she has an upcoming appointment with her PCP (not visible in [facility]). Before the caller can ask any further question, Pt hang up the phone. Pt's PCP documentation is not visible in [facility]. Due to this, Pt screening result was unable to be sent through [facility] message to PCP or see any upcoming appointment with PCP. No additional health equity support indicated at this time

## 2023-04-05 DIAGNOSIS — I959 Hypotension, unspecified: Secondary | ICD-10-CM | POA: Diagnosis not present

## 2023-04-05 DIAGNOSIS — E782 Mixed hyperlipidemia: Secondary | ICD-10-CM | POA: Diagnosis not present

## 2023-04-05 DIAGNOSIS — J45909 Unspecified asthma, uncomplicated: Secondary | ICD-10-CM | POA: Diagnosis not present

## 2023-04-05 DIAGNOSIS — I10 Essential (primary) hypertension: Secondary | ICD-10-CM | POA: Diagnosis not present

## 2023-04-05 DIAGNOSIS — I4719 Other supraventricular tachycardia: Secondary | ICD-10-CM | POA: Diagnosis not present

## 2023-04-05 DIAGNOSIS — Z48815 Encounter for surgical aftercare following surgery on the digestive system: Secondary | ICD-10-CM | POA: Diagnosis not present

## 2023-04-09 DIAGNOSIS — K265 Chronic or unspecified duodenal ulcer with perforation: Secondary | ICD-10-CM | POA: Diagnosis not present

## 2023-04-09 DIAGNOSIS — E876 Hypokalemia: Secondary | ICD-10-CM | POA: Diagnosis not present

## 2023-04-09 DIAGNOSIS — I4891 Unspecified atrial fibrillation: Secondary | ICD-10-CM | POA: Diagnosis not present

## 2023-04-09 DIAGNOSIS — R600 Localized edema: Secondary | ICD-10-CM | POA: Diagnosis not present

## 2023-04-09 DIAGNOSIS — I471 Supraventricular tachycardia, unspecified: Secondary | ICD-10-CM | POA: Diagnosis not present

## 2023-04-11 DIAGNOSIS — E782 Mixed hyperlipidemia: Secondary | ICD-10-CM | POA: Diagnosis not present

## 2023-04-11 DIAGNOSIS — I10 Essential (primary) hypertension: Secondary | ICD-10-CM | POA: Diagnosis not present

## 2023-04-11 DIAGNOSIS — I4719 Other supraventricular tachycardia: Secondary | ICD-10-CM | POA: Diagnosis not present

## 2023-04-11 DIAGNOSIS — I959 Hypotension, unspecified: Secondary | ICD-10-CM | POA: Diagnosis not present

## 2023-04-11 DIAGNOSIS — J45909 Unspecified asthma, uncomplicated: Secondary | ICD-10-CM | POA: Diagnosis not present

## 2023-04-11 DIAGNOSIS — Z48815 Encounter for surgical aftercare following surgery on the digestive system: Secondary | ICD-10-CM | POA: Diagnosis not present

## 2023-04-13 DIAGNOSIS — I4719 Other supraventricular tachycardia: Secondary | ICD-10-CM | POA: Diagnosis not present

## 2023-04-13 DIAGNOSIS — I10 Essential (primary) hypertension: Secondary | ICD-10-CM | POA: Diagnosis not present

## 2023-04-13 DIAGNOSIS — I959 Hypotension, unspecified: Secondary | ICD-10-CM | POA: Diagnosis not present

## 2023-04-13 DIAGNOSIS — J45909 Unspecified asthma, uncomplicated: Secondary | ICD-10-CM | POA: Diagnosis not present

## 2023-04-13 DIAGNOSIS — Z48815 Encounter for surgical aftercare following surgery on the digestive system: Secondary | ICD-10-CM | POA: Diagnosis not present

## 2023-04-13 DIAGNOSIS — E782 Mixed hyperlipidemia: Secondary | ICD-10-CM | POA: Diagnosis not present

## 2023-04-18 DIAGNOSIS — I959 Hypotension, unspecified: Secondary | ICD-10-CM | POA: Diagnosis not present

## 2023-04-18 DIAGNOSIS — J45909 Unspecified asthma, uncomplicated: Secondary | ICD-10-CM | POA: Diagnosis not present

## 2023-04-18 DIAGNOSIS — E782 Mixed hyperlipidemia: Secondary | ICD-10-CM | POA: Diagnosis not present

## 2023-04-18 DIAGNOSIS — Z48815 Encounter for surgical aftercare following surgery on the digestive system: Secondary | ICD-10-CM | POA: Diagnosis not present

## 2023-04-18 DIAGNOSIS — I4719 Other supraventricular tachycardia: Secondary | ICD-10-CM | POA: Diagnosis not present

## 2023-04-18 DIAGNOSIS — I10 Essential (primary) hypertension: Secondary | ICD-10-CM | POA: Diagnosis not present

## 2023-04-29 DIAGNOSIS — E782 Mixed hyperlipidemia: Secondary | ICD-10-CM | POA: Diagnosis not present

## 2023-04-29 DIAGNOSIS — Z48815 Encounter for surgical aftercare following surgery on the digestive system: Secondary | ICD-10-CM | POA: Diagnosis not present

## 2023-04-29 DIAGNOSIS — I10 Essential (primary) hypertension: Secondary | ICD-10-CM | POA: Diagnosis not present

## 2023-04-29 DIAGNOSIS — J45909 Unspecified asthma, uncomplicated: Secondary | ICD-10-CM | POA: Diagnosis not present

## 2023-04-29 DIAGNOSIS — Z9049 Acquired absence of other specified parts of digestive tract: Secondary | ICD-10-CM | POA: Diagnosis not present

## 2023-04-29 DIAGNOSIS — I4719 Other supraventricular tachycardia: Secondary | ICD-10-CM | POA: Diagnosis not present

## 2023-04-29 DIAGNOSIS — I959 Hypotension, unspecified: Secondary | ICD-10-CM | POA: Diagnosis not present

## 2023-04-29 DIAGNOSIS — Z7982 Long term (current) use of aspirin: Secondary | ICD-10-CM | POA: Diagnosis not present

## 2023-04-29 DIAGNOSIS — Z853 Personal history of malignant neoplasm of breast: Secondary | ICD-10-CM | POA: Diagnosis not present

## 2023-04-29 DIAGNOSIS — Z87442 Personal history of urinary calculi: Secondary | ICD-10-CM | POA: Diagnosis not present

## 2023-05-02 DIAGNOSIS — J45909 Unspecified asthma, uncomplicated: Secondary | ICD-10-CM | POA: Diagnosis not present

## 2023-05-02 DIAGNOSIS — I4719 Other supraventricular tachycardia: Secondary | ICD-10-CM | POA: Diagnosis not present

## 2023-05-02 DIAGNOSIS — Z48815 Encounter for surgical aftercare following surgery on the digestive system: Secondary | ICD-10-CM | POA: Diagnosis not present

## 2023-05-02 DIAGNOSIS — I959 Hypotension, unspecified: Secondary | ICD-10-CM | POA: Diagnosis not present

## 2023-05-02 DIAGNOSIS — I10 Essential (primary) hypertension: Secondary | ICD-10-CM | POA: Diagnosis not present

## 2023-05-02 DIAGNOSIS — E782 Mixed hyperlipidemia: Secondary | ICD-10-CM | POA: Diagnosis not present

## 2023-05-04 DIAGNOSIS — H402231 Chronic angle-closure glaucoma, bilateral, mild stage: Secondary | ICD-10-CM | POA: Diagnosis not present

## 2023-05-04 DIAGNOSIS — H04123 Dry eye syndrome of bilateral lacrimal glands: Secondary | ICD-10-CM | POA: Diagnosis not present

## 2023-05-04 DIAGNOSIS — H26492 Other secondary cataract, left eye: Secondary | ICD-10-CM | POA: Diagnosis not present

## 2023-05-04 DIAGNOSIS — M794 Hypertrophy of (infrapatellar) fat pad: Secondary | ICD-10-CM | POA: Diagnosis not present

## 2023-05-09 DIAGNOSIS — I4719 Other supraventricular tachycardia: Secondary | ICD-10-CM | POA: Diagnosis not present

## 2023-05-09 DIAGNOSIS — Z48815 Encounter for surgical aftercare following surgery on the digestive system: Secondary | ICD-10-CM | POA: Diagnosis not present

## 2023-05-09 DIAGNOSIS — E782 Mixed hyperlipidemia: Secondary | ICD-10-CM | POA: Diagnosis not present

## 2023-05-09 DIAGNOSIS — J45909 Unspecified asthma, uncomplicated: Secondary | ICD-10-CM | POA: Diagnosis not present

## 2023-05-09 DIAGNOSIS — I959 Hypotension, unspecified: Secondary | ICD-10-CM | POA: Diagnosis not present

## 2023-05-09 DIAGNOSIS — I10 Essential (primary) hypertension: Secondary | ICD-10-CM | POA: Diagnosis not present

## 2023-05-14 DIAGNOSIS — J45909 Unspecified asthma, uncomplicated: Secondary | ICD-10-CM | POA: Diagnosis not present

## 2023-05-14 DIAGNOSIS — I4719 Other supraventricular tachycardia: Secondary | ICD-10-CM | POA: Diagnosis not present

## 2023-05-14 DIAGNOSIS — Z48815 Encounter for surgical aftercare following surgery on the digestive system: Secondary | ICD-10-CM | POA: Diagnosis not present

## 2023-05-14 DIAGNOSIS — I10 Essential (primary) hypertension: Secondary | ICD-10-CM | POA: Diagnosis not present

## 2023-05-14 DIAGNOSIS — E782 Mixed hyperlipidemia: Secondary | ICD-10-CM | POA: Diagnosis not present

## 2023-05-14 DIAGNOSIS — I959 Hypotension, unspecified: Secondary | ICD-10-CM | POA: Diagnosis not present

## 2023-05-21 ENCOUNTER — Encounter: Payer: Self-pay | Admitting: Internal Medicine

## 2023-05-21 ENCOUNTER — Ambulatory Visit: Payer: Medicare Other | Attending: Internal Medicine

## 2023-05-21 ENCOUNTER — Ambulatory Visit: Payer: Medicare Other | Attending: Internal Medicine | Admitting: Internal Medicine

## 2023-05-21 VITALS — BP 122/78 | HR 62 | Ht 60.0 in | Wt 141.2 lb

## 2023-05-21 DIAGNOSIS — I480 Paroxysmal atrial fibrillation: Secondary | ICD-10-CM | POA: Insufficient documentation

## 2023-05-21 MED ORDER — APIXABAN 5 MG PO TABS: 5.0000 mg | ORAL_TABLET | Freq: Two times a day (BID) | ORAL | 11 refills | Status: AC

## 2023-05-21 NOTE — Patient Instructions (Addendum)
Medication Instructions:   STOP aspirin   START eliquis 5mg  twice daily (every 12 hours)    *If you need a refill on your cardiac medications before your next appointment, please call your pharmacy*   Testing/Procedures: Your physician has requested that you have an echocardiogram. Echocardiography is a painless test that uses sound waves to create images of your heart. It provides your doctor with information about the size and shape of your heart and how well your heart's chambers and valves are working. This procedure takes approximately one hour. There are no restrictions for this procedure. Please do NOT wear cologne, perfume, aftershave, or lotions (deodorant is allowed). Please arrive 15 minutes prior to your appointment time. Test locations:  [facility] [address] - 3rd Floor, [facility] [address], [place]   ZIO XT- Long Term Monitor Instructions  Your physician has requested you wear a ZIO patch monitor for 14 days.  This is a single patch monitor. [employer] supplies one patch monitor per enrollment. Additional stickers are not available. Please do not apply patch if you will be having a Nuclear Stress Test,  Echocardiogram, Cardiac CT, MRI, or Chest Xray during the period you would be wearing the  monitor. The patch cannot be worn during these tests. You cannot remove and re-apply the  ZIO XT patch monitor.  Your ZIO patch monitor will be mailed 3 day USPS to your address on file. It may take 3-5 days  to receive your monitor after you have been enrolled.  Once you have received your monitor, please review the enclosed instructions. Your monitor  has already been registered assigning a specific monitor serial # to you.  Billing and Patient Assistance Program Information  We have supplied [employer] with any of your insurance information on file for billing purposes. [employer] offers a sliding scale Patient Assistance  Program for patients that do not have  insurance, or whose insurance does not completely cover the cost of the ZIO monitor.  You must apply for the Patient Assistance Program to qualify for this discounted rate.  To apply, please call [employer] at [phone], select option 4, select option 2, ask to apply for  Patient Assistance Program. Meredeth Ide will ask your household income, and how many people  are in your household. They will quote your out-of-pocket cost based on that information.  [employer] will also be able to set up a 52-month, interest-free payment plan if needed.  Applying the monitor   Shave hair from upper left chest.  Hold abrader disc by orange tab. Rub abrader in 40 strokes over the upper left chest as  indicated in your monitor instructions.  Clean area with 4 enclosed alcohol pads. Let dry.  Apply patch as indicated in monitor instructions. Patch will be placed under collarbone on left  side of chest with arrow pointing upward.  Rub patch adhesive wings for 2 minutes. Remove white label marked "1". Remove the white  label marked "2". Rub patch adhesive wings for 2 additional minutes.  While looking in a mirror, press and release button in center of patch. A small green light will  flash 3-4 times. This will be your only indicator that the monitor has been turned on.  Do not shower for the first 24 hours. You may shower after the first 24 hours.  Press the button if you feel a symptom. You will hear a small click. Record Date, Time and  Symptom in the Patient Logbook.  When you are ready to remove the patch, follow instructions on the last 2 pages of Patient  Logbook. Stick patch monitor onto the last page of Patient Logbook.  Place Patient Logbook in the blue and white box. Use locking tab on box and tape box closed  securely. The blue and white box has prepaid postage on it. Please place it in the mailbox as  soon as possible. Your physician should have your test results  approximately 7 days after the  monitor has been mailed back to [facility].  Call [facility] Customer Care at [phone] if you have questions regarding  your ZIO XT patch monitor. Call them immediately if you see an orange light blinking on your  monitor.  If your monitor falls off in less than 4 days, contact our Monitor department at [phone].  If your monitor becomes loose or falls off after 4 days call [employer] at [phone] for  suggestions on securing your monitor   Follow-Up: At [facility], you and your health needs are our priority.  As part of our continuing mission to provide you with exceptional heart care, we have created designated Provider Care Teams.  These Care Teams include your primary Cardiologist (physician) and Advanced Practice Providers (APPs -  Physician Assistants and Nurse Practitioners) who all work together to provide you with the care you need, when you need it.  We recommend signing up for the patient portal called "MyChart".  Sign up information is provided on this After Visit Summary.  MyChart is used to connect with patients for Virtual Visits (Telemedicine).  Patients are able to view lab/test results, encounter notes, upcoming appointments, etc.  Non-urgent messages can be sent to your provider as well.   To learn more about what you can do with MyChart, go to [email or web address].    Your next appointment:    3 months with Dr. Wyline Mood    Atrial Fibrillation Atrial fibrillation (AFib) is a type of heartbeat that is irregular or fast. If you have AFib, your heart beats without any order. This makes it hard for your heart to pump blood in a normal way. AFib may come and go, or it may become a long-lasting problem. If AFib is not treated, it can put you at higher risk for stroke, heart failure, and other heart problems. What are the causes?  AFib may be caused by diseases that damage the heart's electrical system. They  include: High blood pressure. Heart failure. Heart valve diseases. Heart surgery. Diabetes. Thyroid disease. Kidney disease. Lung diseases, such as pneumonia or COPD. Sleep apnea. Sometimes the cause is not known. What increases the risk? You are more likely to develop AFib if: You are older. You exercise often and very hard. You have a family history of AFib. You are female. You are Caucasian. You are overweight. You smoke. You drink a lot of alcohol. What are the signs or symptoms? Common symptoms of this condition include: A feeling that your heart is beating very fast. Chest pain or discomfort. Feeling short of breath. Suddenly feeling light-headed or weak. Getting tired easily during activity. Fainting. Sweating. In some cases, there are no symptoms. How is this treated? Medicines to: Prevent blood clots. Treat heart rate or heart rhythm problems. Using devices, such as a pacemaker, to correct heart rhythm problems. Doing surgery to remove the part of the heart that sends bad signals. Closing an area where clots can form in the heart (left atrial appendage). In some cases, your doctor  will treat other underlying conditions. Follow these instructions at home: Medicines Take over-the-counter and prescription medicines only as told by your doctor. Do not take any new medicines without first talking to your doctor. If you are taking blood thinners: Talk with your doctor before taking aspirin or NSAIDs, such as ibuprofen. Take your medicines as told. Take them at the same time each day. Do not do things that could hurt or bruise you. Be careful to avoid falls. Wear an alert bracelet or carry a card that says you take blood thinners. Lifestyle Do not smoke or use any products that contain nicotine or tobacco. If you need help quitting, ask your doctor. Eat heart-healthy foods. Talk with your doctor about the right eating plan for you. Exercise regularly as told by your  doctor. Do not drink alcohol. Lose weight if you are overweight. General instructions If you have sleep apnea, treat it as told by your doctor. Do not use diet pills unless your doctor says they are safe for you. Diet pills may make heart problems worse. Keep all follow-up visits. Your doctor will check your heart rate and rhythm regularly. Contact a doctor if: You notice a change in the speed, rhythm, or strength of your heartbeat. You are taking a blood-thinning medicine and you get more bruising. You get tired more easily when you move or exercise. You have a sudden change in weight. Get help right away if:  You have pain in your chest. You have trouble breathing. You have side effects of blood thinners, such as blood in your vomit, poop (stool), or pee (urine), or bleeding that cannot stop. You have any signs of a stroke. "BE FAST" is an easy way to remember the main warning signs: B - Balance. Dizziness, sudden trouble walking, or loss of balance. E - Eyes. Trouble seeing or a change in how you see. F - Face. Sudden weakness or loss of feeling in the face. The face or eyelid may droop on one side. A - Arms.Weakness or loss of feeling in an arm. This happens suddenly and usually on one side of the body. S - Speech. Sudden trouble speaking, slurred speech, or trouble understanding what people say. T - Time.Time to call emergency services. Write down what time symptoms started. You have other signs of a stroke, such as: A sudden, very bad headache with no known cause. Feeling like you may vomit (nausea). Vomiting. A seizure. These symptoms may be an emergency. Get help right away. Call 911. Do not wait to see if the symptoms will go away. Do not drive yourself to the hospital. This information is not intended to replace advice given to you by your health care provider. Make sure you discuss any questions you have with your health care provider. Document Revised: 08/02/2022 Document  Reviewed: 08/02/2022 Elsevier Patient Education  2024 [employer].

## 2023-05-21 NOTE — Progress Notes (Signed)
Cardiology Office Note:    Date:  05/21/2023   ID:  Miranda [name], DOB July 13, 1934, MRN [id number]  PCP:  Benita Stabile, MD   [facility] Providers Cardiologist:  None     Referring MD: Benita Stabile, MD   No chief complaint on file. Paroxysmal Afib  History of Present Illness:    Miranda [name] is a 89 y.o. female with a hx of asthma, HTN, referral for afib.   She was recently hospitalized for a perforated peptic ulcer in late April in [place]. She developed Afib rates 149 bpm at that time. She was not seen by cardiology, no discussion of AC. She is still on her aspirin and has not had any bleeding issues. She states she has hx of afib. She is on a BB. She was diagnosed 2 -4 years ago. No one discussed risk of CVA or AC. She has not been on [employer]. She saw Dr. Diona Browner in 2013 for PSVT. Stress echo was done which did not show signs of ischemia. No hx of CVA. No DM2.     Past Medical History:  Diagnosis Date   Asthma    Cancer (HCC)    Essential hypertension, benign    History of kidney stones    Mixed hyperlipidemia     Past Surgical History:  Procedure Laterality Date   ABDOMINAL HYSTERECTOMY     excessive bleeding, no cancer    BREAST LUMPECTOMY Right 2020   CARPAL TUNNEL RELEASE Left 09/06/2021   Procedure: CARPAL TUNNEL RELEASE;  Surgeon: Vickki Hearing, MD;  Location: [facility];  Service: Orthopedics;  Laterality: Left;   GASTRORRHAPHY N/A 03/21/2023   Procedure: GASTRORRHAPHY;  Surgeon: Franky Macho, MD;  Location: [facility];  Service: General;  Laterality: N/A;   LAPAROTOMY N/A 03/21/2023   Procedure: EXPLORATORY LAPAROTOMY;  Surgeon: Franky Macho, MD;  Location: [facility];  Service: General;  Laterality: N/A;   Left knee arthroscopy due to torn ligament and cartiallege  2006   [facility]    Left lung - lobectomy lower lobe - lung cancer     RADIOACTIVE SEED GUIDED PARTIAL MASTECTOMY WITH AXILLARY SENTINEL LYMPH NODE BIOPSY Right 06/24/2019   Procedure:  RADIOACTIVE SEED GUIDED RIGHT BREAST PARTIAL MASTECTOMY WITH  SENTINEL LYMPH NODE BIOPSY;  Surgeon: Abigail Miyamoto, MD;  Location: [facility];  Service: General;  Laterality: Right;    Current Medications: Current Meds  Medication Sig   acetaminophen (TYLENOL) 650 MG CR tablet Take 650 mg by mouth daily as needed for pain.   anastrozole (ARIMIDEX) 1 MG tablet Take 1 tablet (1 mg total) by mouth daily.   aspirin 81 MG EC tablet Take 81 mg by mouth daily.     Cholecalciferol (VITAMIN D-3 PO) Take 1 capsule by mouth daily.   gabapentin (NEURONTIN) 100 MG capsule TAKE 1 CAPSULE BY MOUTH THREE TIMES A DAY   ibuprofen (ADVIL) 200 MG tablet Take 200 mg by mouth daily as needed for headache or moderate pain.   latanoprost (XALATAN) 0.005 % ophthalmic solution Place 1 drop into both eyes at bedtime.   metoprolol tartrate (LOPRESSOR) 25 MG tablet Take 25 mg by mouth daily.   pantoprazole (PROTONIX) 40 MG tablet Take 1 tablet (40 mg total) by mouth daily.   Polyvinyl Alcohol-Povidone (REFRESH OP) Place 1 drop into both eyes daily as needed (dry eyes).   potassium chloride SA (KLOR-CON M) 20 MEQ tablet Take 20-40 mEq by mouth See admin instructions. 40 mEq in the morning,  20 mEq in the evening.   rosuvastatin (CRESTOR) 10 MG tablet Take 10 mg by mouth daily.   telmisartan (MICARDIS) 80 MG tablet Take 80 mg by mouth daily.   timolol (TIMOPTIC) 0.5 % ophthalmic solution Place 1 drop into both eyes daily.   torsemide (DEMADEX) 20 MG tablet Take 20 mg by mouth daily as needed (swelling).     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[employer] [place]   Tobacco Use   Smoking status: Never   Smokeless tobacco: Never  Vaping Use   Vaping Use: Never used  Substance and  Sexual Activity   Alcohol use: Yes    Comment: occasional   Drug use: No   Sexual activity: Not on file  Other Topics Concern   Not on file  Social History Narrative   Not on file   Social Determinants of Health   Financial Resource Strain: Not on file  Food Insecurity: No Food Insecurity (03/21/2023)   Hunger Vital Sign    Worried About Running Out of Food in the Last Year: Never true    Ran Out of Food in the Last Year: Never true  Transportation Needs: No Transportation Needs (03/21/2023)   PRAPARE - Administrator, Civil Service (Medical): No    Lack of Transportation (Non-Medical): No  Physical Activity: Not on file  Stress: Not on file  Social Connections: Not on file     Family History: The patient's family history includes Anuerysm in her mother; Arthritis in an other family member; Cancer in an other family member; Colon cancer in her father; Heart attack in her mother; Hypertension in her sister, sister, and son; Melanoma in her sister; Stroke in her sister.  ROS:   Please see the history of present illness.     All other systems reviewed and are negative.  EKGs/Labs/Other Studies Reviewed:    The following studies were reviewed today:  EKG Interpretation  Date/Time:  Monday May 21 2023 10:45:18 EDT Ventricular Rate:  62 PR Interval:  156 QRS Duration: 82 QT Interval:  460 QTC Calculation: 466 R Axis:   -10 Text Interpretation: Sinus rhythm with Premature atrial complexes ST & T wave abnormality, consider anterolateral ischemia When compared with ECG of 26-Mar-2023 15:04, Sinus rhythm has replaced Atrial fibrillation Vent. rate has decreased BY  87 BPM Nonspecific T wave abnormality, improved in Inferior leads T wave inversion now evident in Anterior leads Confirmed by Carolan Clines [phone]) on 05/21/2023 10:52:51 AM    Recent Labs: 03/26/2023: ALT 33 03/29/2023: BUN 6; Creatinine, Ser 0.75; Hemoglobin 10.7; Magnesium 1.7; Platelets 238; Potassium 3.1;  Sodium 137  Recent Lipid Panel    Component Value Date/Time   CHOL 144 12/19/2008 0555   TRIG 60 12/19/2008 0555   HDL 59 12/19/2008 0555   CHOLHDL 2.4 Ratio 12/19/2008 0555   VLDL 12 12/19/2008 0555   LDLCALC 73 12/19/2008 0555     Risk Assessment/Calculations:    CHA2DS2-VASc Score = 4   This indicates a 4.8% annual risk of stroke. The patient's score is based upon: CHF History: 0 HTN History: 1 Diabetes History: 0 Stroke History: 0 Vascular Disease History: 0 Age Score: 2 Gender Score: 1  Physical Exam:    VS:  BP 122/78   Pulse 62   Ht 5' (1.524 m)   Wt 141 lb 3.2 oz (64 kg)   SpO2 97%   BMI 27.58 kg/m     Wt Readings from Last 3 Encounters:  05/21/23 141 lb 3.2 oz (64 kg)  04/03/23 156 lb (70.8 kg)  03/25/23 169 lb 15.6 oz (77.1 kg)     GEN:  Well nourished, well developed in no acute distress HEENT: Normal NECK: No JVD; No carotid bruits LYMPHATICS: No lymphadenopathy CARDIAC: RRR, no murmurs, rubs, gallops RESPIRATORY:  Clear to auscultation without rales, wheezing or rhonchi  ABDOMEN: Soft, non-tender, non-distended MUSCULOSKELETAL:  No edema; No deformity  SKIN: Warm and dry NEUROLOGIC:  Alert and oriented x 3 PSYCHIATRIC:  Normal affect   ASSESSMENT:    1. Atrial fibrillation, unspecified type [facility])   She was managed for a perforated peptic ulcer in late April s/p ex lap and gastrorrhaphy on 4/24. She is not on [facility] currently, will start today. Discussed signs of GI bleed and to stop eliquis if this were to occur. She is in sinus rhythm today. She can continue BB. PLAN:    In order of problems listed above:  14 day ziopatch TTE Start eliquis 5 mg BID  Follow up 3 months           Medication Adjustments/Labs and Tests Ordered: Current medicines are reviewed at length with the patient today.  Concerns regarding medicines are outlined above.  Orders Placed This Encounter  Procedures   EKG 12-Lead   No orders of the  defined types were placed in this encounter.   There are no Patient Instructions on file for this visit.   Signed, Maisie Fus, MD  05/21/2023 10:57 AM    [facility]

## 2023-05-21 NOTE — Progress Notes (Unsigned)
Enrolled for [employer] to mail a ZIO XT long term holter monitor to the patients address on file.  

## 2023-05-28 DIAGNOSIS — I480 Paroxysmal atrial fibrillation: Secondary | ICD-10-CM

## 2023-06-19 ENCOUNTER — Ambulatory Visit (HOSPITAL_COMMUNITY): Payer: Medicare Other | Attending: Internal Medicine

## 2023-06-19 DIAGNOSIS — I480 Paroxysmal atrial fibrillation: Secondary | ICD-10-CM | POA: Diagnosis not present

## 2023-06-19 LAB — ECHOCARDIOGRAM COMPLETE
Area-P 1/2: 2.47 cm2
S' Lateral: 3.2 cm

## 2023-06-21 ENCOUNTER — Other Ambulatory Visit (HOSPITAL_COMMUNITY): Payer: Self-pay | Admitting: Internal Medicine

## 2023-06-21 DIAGNOSIS — Z1231 Encounter for screening mammogram for malignant neoplasm of breast: Secondary | ICD-10-CM

## 2023-06-28 DIAGNOSIS — I480 Paroxysmal atrial fibrillation: Secondary | ICD-10-CM | POA: Diagnosis not present

## 2023-07-05 ENCOUNTER — Encounter (HOSPITAL_COMMUNITY): Payer: Self-pay

## 2023-07-05 ENCOUNTER — Ambulatory Visit (HOSPITAL_COMMUNITY)
Admission: RE | Admit: 2023-07-05 | Discharge: 2023-07-05 | Disposition: A | Discharge status: Home / Self Care | Payer: Medicare Other | Source: Ambulatory Visit | Attending: Internal Medicine | Admitting: Internal Medicine

## 2023-07-05 DIAGNOSIS — Z1231 Encounter for screening mammogram for malignant neoplasm of breast: Secondary | ICD-10-CM | POA: Insufficient documentation

## 2023-08-20 ENCOUNTER — Inpatient Hospital Stay: Payer: Medicare Other | Attending: Hematology and Oncology | Admitting: Hematology and Oncology

## 2023-08-20 VITALS — BP 187/93 | HR 62 | Temp 98.1°F | Resp 17 | Wt 149.9 lb

## 2023-08-20 DIAGNOSIS — M25471 Effusion, right ankle: Secondary | ICD-10-CM | POA: Diagnosis not present

## 2023-08-20 DIAGNOSIS — C50311 Malignant neoplasm of lower-inner quadrant of right female breast: Secondary | ICD-10-CM | POA: Diagnosis not present

## 2023-08-20 DIAGNOSIS — M25472 Effusion, left ankle: Secondary | ICD-10-CM | POA: Insufficient documentation

## 2023-08-20 DIAGNOSIS — Z79811 Long term (current) use of aromatase inhibitors: Secondary | ICD-10-CM | POA: Diagnosis not present

## 2023-08-20 DIAGNOSIS — Z170 Estrogen receptor positive status [ER+]: Secondary | ICD-10-CM | POA: Insufficient documentation

## 2023-08-20 MED ORDER — ANASTROZOLE 1 MG PO TABS: 1.0000 mg | ORAL_TABLET | Freq: Every day | ORAL | 3 refills | Status: DC

## 2023-08-20 NOTE — Progress Notes (Signed)
Patient Care Team: Benita Stabile, MD as PCP - General (Internal Medicine) Serena Croissant, MD as Consulting Physician (Hematology and Oncology) Abigail Miyamoto, MD as Consulting Physician (General Surgery)  DIAGNOSIS:  Encounter Diagnosis  Name Primary?   Malignant neoplasm of lower-inner quadrant of right breast of female, estrogen receptor positive (HCC) Yes    SUMMARY OF ONCOLOGIC HISTORY: Oncology History  Malignant neoplasm of lower-inner quadrant of right breast of female, estrogen receptor positive (HCC)  06/12/2019 Initial Diagnosis   Screening mammogram detected right breast asymmetry. Diagnostic mammogram and US showed 0.5cm right breast mass. Biopsy showed IDC, grade 1, HER-2 - (0), ER+ 100%, PR+ 70%, Ki67 5%.    06/12/2019 Cancer Staging   Staging form: Breast, AJCC 8th Edition - Clinical stage from 06/12/2019: Stage IA (cT1a, cN0, cM0, G2, ER+, PR+, HER2-) - Signed by Serena Croissant, MD on 06/17/2019   06/24/2019 Surgery   Right lumpectomy Magnus Ivan): IDC with intermediate grade DCIS, 0.6cm, grade 2, lymphovascular space invasion present, clear margins, and one right axillary lymph node negative.    06/24/2019 Cancer Staging   Staging form: Breast, AJCC 8th Edition - Pathologic stage from 06/24/2019: Stage IA (pT1a, pN0, cM0, G2, ER+, PR+, HER2-) - Signed by Loa Socks, NP on 10/01/2019   06/2019 -  Anti-estrogen oral therapy   Anastrozole daily     CHIEF COMPLIANT:   Discussed the use of AI scribe software for clinical note transcription with the patient, who gave verbal consent to proceed.  History of Present Illness   Ms. Alcantara, a patient with a history of breast cancer, presents for her annual follow-up. She has been adherent to her anastrozole regimen and reports no issues with the medication.  In the past year, she experienced a significant health event, a perforated ulcer that required emergency surgery. She initially mistook the symptoms for  constipation, but the pain escalated to a point where she was unable to stand. She was admitted to the hospital for nine days following the surgery.  She denies any current breast pain or discomfort. She also reports swelling around her ankles, which she manages with a medication taken every Monday and Friday.         ALLERGIES:  is allergic to bee venom.  MEDICATIONS:  Current Outpatient Medications  Medication Sig Dispense Refill   acetaminophen (TYLENOL) 650 MG CR tablet Take 650 mg by mouth daily as needed for pain.     apixaban (ELIQUIS) 5 MG TABS tablet Take 1 tablet (5 mg total) by mouth 2 (two) times daily. 60 tablet 11   Cholecalciferol (VITAMIN D-3 PO) Take 1 capsule by mouth daily.     gabapentin (NEURONTIN) 100 MG capsule TAKE 1 CAPSULE BY MOUTH THREE TIMES A DAY 90 capsule 2   latanoprost (XALATAN) 0.005 % ophthalmic solution Place 1 drop into both eyes at bedtime.  3   metoprolol tartrate (LOPRESSOR) 25 MG tablet Take 25 mg by mouth daily.  3   pantoprazole (PROTONIX) 40 MG tablet Take 1 tablet (40 mg total) by mouth daily. 30 tablet 1   Polyvinyl Alcohol-Povidone (REFRESH OP) Place 1 drop into both eyes daily as needed (dry eyes).     potassium chloride SA (KLOR-CON M) 20 MEQ tablet Take 20-40 mEq by mouth See admin instructions. 40 mEq in the morning, 20 mEq in the evening.     rosuvastatin (CRESTOR) 10 MG tablet Take 10 mg by mouth daily.     telmisartan (MICARDIS) 80 MG tablet Take  80 mg by mouth daily.     telmisartan-hydrochlorothiazide (MICARDIS HCT) 80-25 MG tablet Take 1 tablet by mouth daily.     timolol (TIMOPTIC) 0.5 % ophthalmic solution Place 1 drop into both eyes daily.     torsemide (DEMADEX) 20 MG tablet Take 20 mg by mouth daily as needed (swelling).     anastrozole (ARIMIDEX) 1 MG tablet Take 1 tablet (1 mg total) by mouth daily. 90 tablet 3   No current facility-administered medications for this visit.    PHYSICAL EXAMINATION: ECOG PERFORMANCE  STATUS: 1 - Symptomatic but completely ambulatory  Vitals:   08/20/23 0923  BP: (!) 187/93  Pulse: 62  Resp: 17  Temp: 98.1 F (36.7 C)  SpO2: 100%   Filed Weights   08/20/23 0923  Weight: 149 lb 14.4 oz (68 kg)    Physical Exam   BREAST: No abnormalities noted. EXTREMITIES: Edema around ankles.        LABORATORY DATA:  I have reviewed the data as listed    Latest Ref Rng & Units 03/29/2023    4:29 AM 03/28/2023    3:34 AM 03/26/2023    5:04 AM  CMP  Glucose 70 - 99 mg/dL 191  478  295   BUN 8 - 23 mg/dL 6  9  13    Creatinine 0.44 - 1.00 mg/dL 6.21  3.08  6.57   Sodium 135 - 145 mmol/L 137  138  144   Potassium 3.5 - 5.1 mmol/L 3.1  2.9  3.9   Chloride 98 - 111 mmol/L 105  107  111   CO2 22 - 32 mmol/L 25  24  23    Calcium 8.9 - 10.3 mg/dL 8.0  8.1  8.7   Total Protein 6.5 - 8.1 g/dL   6.2   Total Bilirubin 0.3 - 1.2 mg/dL   1.0   Alkaline Phos 38 - 126 U/L   60   AST 15 - 41 U/L   35   ALT 0 - 44 U/L   33     Lab Results  Component Value Date   WBC 8.3 03/29/2023   HGB 10.7 (L) 03/29/2023   HCT 33.0 (L) 03/29/2023   MCV 84.4 03/29/2023   PLT 238 03/29/2023   NEUTROABS 2.7 12/19/2008    ASSESSMENT & PLAN:  Malignant neoplasm of lower-inner quadrant of right breast of female, estrogen receptor positive (HCC) 06/16/2019: Right lumpectomy:Right lumpectomy Magnus Ivan): IDC with intermediate grade DCIS, 0.6cm, grade 2, lymphovascular space invasion present, clear margins, and one right axillary lymph node negative. grade 1, HER-2 - (0), ER+ 100%, PR+ 70%, Ki67 5%. T1BN0 stage Ia   Current treatment: Anastrozole 1 mg daily started 07/01/2019 Anastrozole toxicities: Denies any adverse effects to anastrozole therapy Hospitalization 03/20/2023 -03/29/2023: Abdominal pain from perforated peptic ulcer status post exploratory laparotomy and gastrorrhaphy 03/21/2023  Breast cancer surveillance: 1.  Breast exam: 08/20/2023: Benign 2. mammogram 07/09/2023: Benign breast density  category B   Return to clinic in 1 year for follow-up ------------------------------------- Assessment and Plan    Breast Cancer On Anastrozole with no reported side effects. No breast pain or discomfort. Normal breast exam. -Continue Anastrozole. -Return for follow-up in 1 year.  History of Perforated Ulcer Recovered from emergency surgery for perforated ulcer. No current complaints related to this issue. -No specific plan needed at this time.  Lower Extremity Edema Reports swelling around ankles. No pain associated with swelling. -Continue current management as directed by primary care physician.  General Health  Maintenance -Encouraged to continue with exercise and walking.          No orders of the defined types were placed in this encounter.  The patient has a good understanding of the overall plan. she agrees with it. she will call with any problems that may develop before the next visit here. Total time spent: 30 mins including face to face time and time spent for planning, charting and co-ordination of care   Tamsen Meek, MD 08/20/23

## 2023-08-20 NOTE — Assessment & Plan Note (Signed)
06/16/2019: Right lumpectomy:Right lumpectomy Miranda [name]): IDC with intermediate grade DCIS, 0.6cm, grade 2, lymphovascular space invasion present, clear margins, and one right axillary lymph node negative. grade 1, HER-2 - (0), ER+ 100%, PR+ 70%, Ki67 5%. T1BN0 stage Ia   Current treatment: Anastrozole 1 mg daily started 07/01/2019 Anastrozole toxicities: Denies any adverse effects to anastrozole therapy Hospitalization 03/20/2023 -03/29/2023: Abdominal pain from perforated peptic ulcer status post exploratory laparotomy and gastrorrhaphy 03/21/2023  Breast cancer surveillance: 1.  Breast exam: 08/20/2023: Benign 2. mammogram 07/09/2023: Benign breast density category B   Return to clinic in 1 year for follow-up

## 2023-08-21 ENCOUNTER — Ambulatory Visit: Payer: Medicare Other | Admitting: Internal Medicine

## 2023-08-24 DIAGNOSIS — M8580 Other specified disorders of bone density and structure, unspecified site: Secondary | ICD-10-CM | POA: Diagnosis not present

## 2023-08-24 DIAGNOSIS — E876 Hypokalemia: Secondary | ICD-10-CM | POA: Diagnosis not present

## 2023-08-24 DIAGNOSIS — E559 Vitamin D deficiency, unspecified: Secondary | ICD-10-CM | POA: Diagnosis not present

## 2023-08-24 DIAGNOSIS — R7303 Prediabetes: Secondary | ICD-10-CM | POA: Diagnosis not present

## 2023-08-24 DIAGNOSIS — E782 Mixed hyperlipidemia: Secondary | ICD-10-CM | POA: Diagnosis not present

## 2023-08-25 LAB — LAB REPORT - SCANNED
A1c: 5.9
Albumin, Urine POC: 30.2
Creatinine, POC: 142.9 mg/dL
EGFR: 61
Microalb Creat Ratio: 21

## 2023-08-29 ENCOUNTER — Ambulatory Visit: Payer: Medicare Other | Attending: Internal Medicine | Admitting: Internal Medicine

## 2023-08-29 ENCOUNTER — Encounter: Payer: Self-pay | Admitting: Internal Medicine

## 2023-08-29 VITALS — BP 168/100 | HR 81 | Ht 60.0 in | Wt 151.0 lb

## 2023-08-29 DIAGNOSIS — I480 Paroxysmal atrial fibrillation: Secondary | ICD-10-CM | POA: Diagnosis not present

## 2023-08-29 MED ORDER — SPIRONOLACTONE 25 MG PO TABS: 25.0000 mg | ORAL_TABLET | Freq: Every day | ORAL | 3 refills | Status: DC

## 2023-08-29 NOTE — Progress Notes (Signed)
Cardiology Office Note:    Date:  08/29/2023   ID:  Miranda [name], DOB 05/25/1934, MRN [id number]  PCP:  Benita Stabile, MD   [facility] Providers Cardiologist:  None     Referring MD: Benita Stabile, MD   No chief complaint on file. Paroxysmal Afib  History of Present Illness:    Miranda [name] is a 89 y.o. female with a hx of asthma, HTN, referral for afib.   She was recently hospitalized for a perforated peptic ulcer in late April in [place]. She developed Afib rates 149 bpm at that time. She was not seen by cardiology, no discussion of AC. She is still on her aspirin and has not had any bleeding issues. She states she has hx of afib. She is on a BB. She was diagnosed 2 -4 years ago. No one discussed risk of CVA or AC. She has not been on [facility]. She saw Dr. Diona Browner in 2013 for PSVT. Stress echo was done which did not show signs of ischemia. No hx of CVA. No DM2.   Interval hx 08/29/2023 Blood pressure at home are up and down. Otherwise she doing well.  Past Medical History:  Diagnosis Date   Asthma    Breast cancer (HCC) 2020   INVASIVE DUCTAL CARCINOMA/DUCTAL CARCINOMA IN SITU/MARGINS UNINVOLVED BY CARCINOMA   Essential hypertension, benign    History of kidney stones    Mixed hyperlipidemia     Past Surgical History:  Procedure Laterality Date   ABDOMINAL HYSTERECTOMY     excessive bleeding, no cancer    BREAST LUMPECTOMY Right 2020   INVASIVE DUCTAL CARCINOMA/DUCTAL CARCINOMA IN situ/MARGINS UNINVOLVED BY CARCINOMA   CARPAL TUNNEL RELEASE Left 09/06/2021   Procedure: CARPAL TUNNEL RELEASE;  Surgeon: Vickki Hearing, MD;  Location: [facility];  Service: Orthopedics;  Laterality: Left;   GASTRORRHAPHY N/A 03/21/2023   Procedure: GASTRORRHAPHY;  Surgeon: Franky Macho, MD;  Location: [facility];  Service: General;  Laterality: N/A;   LAPAROTOMY N/A 03/21/2023   Procedure: EXPLORATORY LAPAROTOMY;  Surgeon: Franky Macho, MD;  Location: [facility];  Service: General;   Laterality: N/A;   Left knee arthroscopy due to torn ligament and cartiallege  2006   [facility]    Left lung - lobectomy lower lobe - lung cancer     RADIOACTIVE SEED GUIDED PARTIAL MASTECTOMY WITH AXILLARY SENTINEL LYMPH NODE BIOPSY Right 06/24/2019   Procedure: RADIOACTIVE SEED GUIDED RIGHT BREAST PARTIAL MASTECTOMY WITH  SENTINEL LYMPH NODE BIOPSY;  Surgeon: Abigail Miyamoto, MD;  Location: [facility];  Service: General;  Laterality: Right;    Current Medications: Current Meds  Medication Sig   acetaminophen (TYLENOL) 650 MG CR tablet Take 650 mg by mouth daily as needed for pain.   anastrozole (ARIMIDEX) 1 MG tablet Take 1 tablet (1 mg total) by mouth daily.   apixaban (ELIQUIS) 5 MG TABS tablet Take 1 tablet (5 mg total) by mouth 2 (two) times daily.   Cholecalciferol (VITAMIN D-3 PO) Take 1 capsule by mouth daily.   latanoprost (XALATAN) 0.005 % ophthalmic solution Place 1 drop into both eyes at bedtime.   metoprolol tartrate (LOPRESSOR) 25 MG tablet Take 25 mg by mouth daily.   pantoprazole (PROTONIX) 40 MG tablet Take 1 tablet (40 mg total) by mouth daily.   Polyvinyl Alcohol-Povidone (REFRESH OP) Place 1 drop into both eyes daily as needed (dry eyes).   potassium chloride SA (KLOR-CON M) 20 MEQ tablet Take 20-40 mEq by mouth See admin  instructions. 40 mEq in the morning, 20 mEq in the evening.   rosuvastatin (CRESTOR) 10 MG tablet Take 10 mg by mouth daily.   telmisartan (MICARDIS) 80 MG tablet Take 80 mg by mouth daily.   timolol (TIMOPTIC) 0.5 % ophthalmic solution Place 1 drop into both eyes daily.   torsemide (DEMADEX) 20 MG tablet Take 20 mg by mouth daily as needed (swelling).     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[employer] [place]    Tobacco Use   Smoking status: Never   Smokeless tobacco: Never  Vaping Use   Vaping status: Never Used  Substance and Sexual Activity   Alcohol use: Yes    Comment: occasional   Drug use: No   Sexual activity: Not on file  Other Topics Concern   Not on file  Social History Narrative   Not on file   Social Determinants of Health   Financial Resource Strain: Not on file  Food Insecurity: No Food Insecurity (03/21/2023)   Hunger Vital Sign    Worried About Running Out of Food in the Last Year: Never true    Ran Out of Food in the Last Year: Never true  Transportation Needs: No Transportation Needs (03/21/2023)   PRAPARE - Administrator, Civil Service (Medical): No    Lack of Transportation (Non-Medical): No  Physical Activity: Not on file  Stress: Not on file  Social Connections: Not on file     Family History: The patient's family history includes Anuerysm in her mother; Arthritis in an other family member; Cancer in an other family member; Colon cancer in her father; Heart attack in her mother; Hypertension in her sister, sister, and son; Melanoma in her sister; Stroke in her sister.  ROS:   Please see the history of present illness.     All other systems reviewed and are negative.  EKGs/Labs/Other Studies Reviewed:    The following studies were reviewed today:  EKG Interpretation Date/Time:  Wednesday August 29 2023 14:03:54 EDT Ventricular Rate:  81 PR Interval:  134 QRS Duration:  84 QT Interval:  412 QTC Calculation: 478 R Axis:   -17  Text Interpretation: Ectopic atrial rhythm Left ventricular hypertrophy with repolarization abnormality ( Cornell product ) When compared with ECG of 21-May-2023 10:45, Premature atrial complexes are no longer Present Confirmed by Carolan Clines [phone]) on 08/29/2023 2:22:02 PM       Recent Labs: 03/26/2023: ALT 33 03/29/2023: BUN 6; Creatinine, Ser 0.75; Hemoglobin 10.7; Magnesium 1.7; Platelets 238; Potassium 3.1;  Sodium 137  Recent Lipid Panel    Component Value Date/Time   CHOL 144 12/19/2008 0555   TRIG 60 12/19/2008 0555   HDL 59 12/19/2008 0555   CHOLHDL 2.4 Ratio 12/19/2008 0555   VLDL 12 12/19/2008 0555   LDLCALC 73 12/19/2008 0555     Risk Assessment/Calculations:    CHA2DS2-VASc Score = 4   This indicates a 4.8% annual risk of stroke. The patient's score is based upon: CHF History: 0 HTN History: 1 Diabetes History: 0 Stroke History: 0 Vascular Disease History: 0 Age Score: 2 Gender Score: 1      Cardiac Monitor 14 day 05/21/2023 No Afib.  TTE 06/19/2023 EF 60-65% RV nl, mild PH RVSP 41 mmHg LA size 36 cc//m2  No significant valve dx  Physical Exam:    VS:    Vitals:   08/29/23 1404  BP: (!) 168/100  Pulse: 81  SpO2: 93%    Pulse 81   Ht 5' (1.524 m)   Wt 151 lb (68.5 kg)   SpO2 93%   BMI 29.49 kg/m     Wt Readings from Last 3 Encounters:  08/29/23 151 lb (68.5 kg)  08/20/23 149 lb 14.4 oz (68 kg)  05/21/23 141 lb 3.2 oz (64 kg)     Physical Exam Gen: well appearing Neuro: alert and oriented CV: r,r,r no murmurs.  Pulm: CLAB Abd: non distended Ext: No LE edema Skin: warm and well perfused Psych: normal mood   ASSESSMENT:    1. Paroxysmal atrial fibrillation (HCC)   She was managed for a perforated peptic ulcer in late April s/p ex lap and gastrorrhaphy on 4/24. She is not on [facility] currently, will start today. Discussed signs of GI bleed and to stop eliquis if this were to occur. She is in sinus rhythm today. She can continue BB. Continue eliquis 5 mg BID  HTN 168/100 mmhg Wants to avoid norvasc with ankle swelling risk - on telmisartan 80 mg daily - on BB - start spironolactone 25 mg daily   PLAN:    In order of problems listed above:  Follow up 6 months  Medication Adjustments/Labs and Tests Ordered: Current medicines are reviewed at length with the patient today.  Concerns regarding medicines are outlined above.  Orders  Placed This Encounter  Procedures   EKG 12-Lead   No orders of the defined types were placed in this encounter.   There are no Patient Instructions on file for this visit.   Signed, Maisie Fus, MD  08/29/2023 2:09 PM    [facility]

## 2023-08-29 NOTE — Patient Instructions (Addendum)
Medication Instructions:  -Stop POTASSIUM CHLORIDE  - Start Spironolactone (Aldactone) 25mg , once daily   *If you need a refill on your cardiac medications before your next appointment, please call your pharmacy*      Follow-Up: At [facility], you and your health needs are our priority.  As part of our continuing mission to provide you with exceptional heart care, we have created designated Provider Care Teams.  These Care Teams include your primary Cardiologist (physician) and Advanced Practice Providers (APPs -  Physician Assistants and Nurse Practitioners) who all work together to provide you with the care you need, when you need it.  We recommend signing up for the patient portal called "MyChart".  Sign up information is provided on this After Visit Summary.  MyChart is used to connect with patients for Virtual Visits (Telemedicine).  Patients are able to view lab/test results, encounter notes, upcoming appointments, etc.  Non-urgent messages can be sent to your provider as well.   To learn more about what you can do with MyChart, go to [email or web address].    Your next appointment:   6 month(s)  The format for your next appointment:   In Person  Provider:   Maisie Fus, MD

## 2023-08-30 ENCOUNTER — Encounter: Payer: Self-pay | Admitting: Internal Medicine

## 2023-08-30 DIAGNOSIS — E782 Mixed hyperlipidemia: Secondary | ICD-10-CM | POA: Diagnosis not present

## 2023-08-30 DIAGNOSIS — R7303 Prediabetes: Secondary | ICD-10-CM | POA: Diagnosis not present

## 2023-08-30 DIAGNOSIS — M8580 Other specified disorders of bone density and structure, unspecified site: Secondary | ICD-10-CM | POA: Diagnosis not present

## 2023-08-30 DIAGNOSIS — I129 Hypertensive chronic kidney disease with stage 1 through stage 4 chronic kidney disease, or unspecified chronic kidney disease: Secondary | ICD-10-CM | POA: Diagnosis not present

## 2023-08-30 DIAGNOSIS — E876 Hypokalemia: Secondary | ICD-10-CM | POA: Diagnosis not present

## 2023-08-30 DIAGNOSIS — C50311 Malignant neoplasm of lower-inner quadrant of right female breast: Secondary | ICD-10-CM | POA: Diagnosis not present

## 2023-08-30 DIAGNOSIS — R600 Localized edema: Secondary | ICD-10-CM | POA: Diagnosis not present

## 2023-08-30 DIAGNOSIS — Z23 Encounter for immunization: Secondary | ICD-10-CM | POA: Diagnosis not present

## 2023-08-30 DIAGNOSIS — Z85118 Personal history of other malignant neoplasm of bronchus and lung: Secondary | ICD-10-CM | POA: Diagnosis not present

## 2023-08-30 DIAGNOSIS — K265 Chronic or unspecified duodenal ulcer with perforation: Secondary | ICD-10-CM | POA: Diagnosis not present

## 2023-08-30 DIAGNOSIS — I4891 Unspecified atrial fibrillation: Secondary | ICD-10-CM | POA: Diagnosis not present

## 2023-08-30 DIAGNOSIS — N182 Chronic kidney disease, stage 2 (mild): Secondary | ICD-10-CM | POA: Diagnosis not present

## 2023-09-03 DIAGNOSIS — H402231 Chronic angle-closure glaucoma, bilateral, mild stage: Secondary | ICD-10-CM | POA: Diagnosis not present

## 2023-09-20 DIAGNOSIS — Z7901 Long term (current) use of anticoagulants: Secondary | ICD-10-CM | POA: Diagnosis not present

## 2023-09-20 DIAGNOSIS — Z79899 Other long term (current) drug therapy: Secondary | ICD-10-CM | POA: Diagnosis not present

## 2023-09-20 DIAGNOSIS — R600 Localized edema: Secondary | ICD-10-CM | POA: Diagnosis not present

## 2023-09-20 DIAGNOSIS — Z23 Encounter for immunization: Secondary | ICD-10-CM | POA: Diagnosis not present

## 2023-09-20 DIAGNOSIS — E876 Hypokalemia: Secondary | ICD-10-CM | POA: Diagnosis not present

## 2023-09-20 DIAGNOSIS — I129 Hypertensive chronic kidney disease with stage 1 through stage 4 chronic kidney disease, or unspecified chronic kidney disease: Secondary | ICD-10-CM | POA: Diagnosis not present

## 2023-09-20 DIAGNOSIS — M542 Cervicalgia: Secondary | ICD-10-CM | POA: Diagnosis not present

## 2023-09-20 DIAGNOSIS — Z6829 Body mass index (BMI) 29.0-29.9, adult: Secondary | ICD-10-CM | POA: Diagnosis not present

## 2023-09-20 DIAGNOSIS — M5440 Lumbago with sciatica, unspecified side: Secondary | ICD-10-CM | POA: Diagnosis not present

## 2023-09-20 DIAGNOSIS — I4891 Unspecified atrial fibrillation: Secondary | ICD-10-CM | POA: Diagnosis not present

## 2023-09-20 DIAGNOSIS — Z713 Dietary counseling and surveillance: Secondary | ICD-10-CM | POA: Diagnosis not present

## 2023-09-20 DIAGNOSIS — N182 Chronic kidney disease, stage 2 (mild): Secondary | ICD-10-CM | POA: Diagnosis not present

## 2023-10-31 DIAGNOSIS — E876 Hypokalemia: Secondary | ICD-10-CM | POA: Diagnosis not present

## 2023-10-31 DIAGNOSIS — Z6829 Body mass index (BMI) 29.0-29.9, adult: Secondary | ICD-10-CM | POA: Diagnosis not present

## 2023-10-31 DIAGNOSIS — N182 Chronic kidney disease, stage 2 (mild): Secondary | ICD-10-CM | POA: Diagnosis not present

## 2023-10-31 DIAGNOSIS — Z79899 Other long term (current) drug therapy: Secondary | ICD-10-CM | POA: Diagnosis not present

## 2023-10-31 DIAGNOSIS — R600 Localized edema: Secondary | ICD-10-CM | POA: Diagnosis not present

## 2023-10-31 DIAGNOSIS — Z713 Dietary counseling and surveillance: Secondary | ICD-10-CM | POA: Diagnosis not present

## 2023-10-31 DIAGNOSIS — I129 Hypertensive chronic kidney disease with stage 1 through stage 4 chronic kidney disease, or unspecified chronic kidney disease: Secondary | ICD-10-CM | POA: Diagnosis not present

## 2023-12-10 ENCOUNTER — Ambulatory Visit (HOSPITAL_COMMUNITY): Payer: Medicare Other

## 2023-12-12 NOTE — Therapy (Signed)
OUTPATIENT PHYSICAL THERAPY LOWER EXTREMITY EVALUATION   Patient Name: Miranda [name] MRN: [id number] DOB:01-05-1934, 89 y.o., female Today's Date: 12/13/2023  END OF SESSION:  PT End of Session - 12/13/23 0915     Visit Number 1    Number of Visits 6    Date for PT Re-Evaluation 01/04/24    Authorization Type Medicare    Progress Note Due on Visit 10    PT Start Time 0915    PT Stop Time 1000    PT Time Calculation (min) 45 min    Activity Tolerance Patient tolerated treatment well    Behavior During Therapy [facility] for tasks assessed/performed             Past Medical History:  Diagnosis Date   Asthma    Breast cancer (HCC) 2020   INVASIVE DUCTAL CARCINOMA/DUCTAL CARCINOMA IN SITU/MARGINS UNINVOLVED BY CARCINOMA   Essential hypertension, benign    History of kidney stones    Mixed hyperlipidemia    Past Surgical History:  Procedure Laterality Date   ABDOMINAL HYSTERECTOMY     excessive bleeding, no cancer    BREAST LUMPECTOMY Right 2020   INVASIVE DUCTAL CARCINOMA/DUCTAL CARCINOMA IN situ/MARGINS UNINVOLVED BY CARCINOMA   CARPAL TUNNEL RELEASE Left 09/06/2021   Procedure: CARPAL TUNNEL RELEASE;  Surgeon: Vickki Hearing, MD;  Location: [facility];  Service: Orthopedics;  Laterality: Left;   GASTRORRHAPHY N/A 03/21/2023   Procedure: GASTRORRHAPHY;  Surgeon: Franky Macho, MD;  Location: [facility];  Service: General;  Laterality: N/A;   LAPAROTOMY N/A 03/21/2023   Procedure: EXPLORATORY LAPAROTOMY;  Surgeon: Franky Macho, MD;  Location: [facility];  Service: General;  Laterality: N/A;   Left knee arthroscopy due to torn ligament and cartiallege  2006   [facility]    Left lung - lobectomy lower lobe - lung cancer     RADIOACTIVE SEED GUIDED PARTIAL MASTECTOMY WITH AXILLARY SENTINEL LYMPH NODE BIOPSY Right 06/24/2019   Procedure: RADIOACTIVE SEED GUIDED RIGHT BREAST PARTIAL MASTECTOMY WITH  SENTINEL LYMPH NODE BIOPSY;  Surgeon: Abigail Miyamoto, MD;  Location: [facility];   Service: General;  Laterality: Right;   Patient Active Problem List   Diagnosis Date Noted   Perforated viscus 03/21/2023   History of breast cancer 03/21/2023   Hypotension due to hypovolemia 03/21/2023   Perforated peptic ulcer (HCC) 03/20/2023   Carpal tunnel syndrome, left upper limb    Malignant neoplasm of lower-inner quadrant of right breast of female, estrogen receptor positive (HCC) 06/12/2019   Spondylolisthesis of lumbar region 02/03/2018   Lumbar spondylosis 02/03/2018   PSVT (paroxysmal supraventricular tachycardia) (HCC) 01/05/2012   Near syncope 12/05/2011   Palpitations 12/05/2011   Abnormal ECG 12/05/2011   HYPERLIPIDEMIA 11/10/2006   Essential hypertension, benign 11/10/2006    PCP: Benita Stabile, MD  REFERRING PROVIDER: Benita Stabile, MD  REFERRING DIAG: [phone] (ICD-10-CM) - Muscle weakness (generalized)  THERAPY DIAG:  Muscle weakness (generalized) - Plan: PT plan of care cert/re-cert  Difficulty in walking, not elsewhere classified - Plan: PT plan of care cert/re-cert  Chronic pain of right knee - Plan: PT plan of care cert/re-cert  Rationale for Evaluation and Treatment: Rehabilitation  ONSET DATE: April of 2024  SUBJECTIVE:   SUBJECTIVE STATEMENT: Had some surgery in April of this past year and legs feel weak since then; was in the hospital for about 9 days.  Surgery was on the stomach. Saw Dr. Margo Aye and he referred for physical therapy.  She attends the [employer] a  couple times a week for activities; games and such.  Lives alone but her son checks on her frequently. Her Right knee buckles sometimes  PERTINENT HISTORY: Left knee surgery years ago per Dr. Romeo Apple 2006 PAIN:  Are you having pain? No  PRECAUTIONS: None  RED FLAGS: None   WEIGHT BEARING RESTRICTIONS: No  FALLS:  Has patient fallen in last 6 months? No   OCCUPATION: retired  PLOF: Independent  PATIENT GOALS: get my legs stronger  NEXT MD VISIT: every 6 months; next  appt in March  OBJECTIVE:  Note: Objective measures were completed at Evaluation unless otherwise noted.  DIAGNOSTIC FINDINGS: none  PATIENT SURVEYS:  LEFS 75/80 93.8%  COGNITION: Overall cognitive status: Within functional limits for tasks assessed     SENSATION: WFL  EDEMA:  Right knee swelling noted  POSTURE: rounded shoulders and forward head  PALPATION: Puffiness noted right knee; valgus and extension lag  LOWER EXTREMITY ROM:  Active ROM Right eval Left eval  Hip flexion    Hip extension    Hip abduction    Hip adduction    Hip internal rotation    Hip external rotation    Knee flexion 128 133  Knee extension -7 0  Ankle dorsiflexion    Ankle plantarflexion    Ankle inversion    Ankle eversion     (Blank rows = not tested)  LOWER EXTREMITY MMT:  MMT Right eval Left eval  Hip flexion 4- 5  Hip extension    Hip abduction    Hip adduction    Hip internal rotation    Hip external rotation    Knee flexion    Knee extension 4 5  Ankle dorsiflexion 4+ 5  Ankle plantarflexion    Ankle inversion    Ankle eversion     (Blank rows = not tested)  FUNCTIONAL TESTS:  5 times sit to stand: 18.79 sec using hands on thighs 2 minute walk test: next visit  GAIT: Distance walked: 80 ft in clinic  Assistive device utilized: None Level of assistance: Modified independence Comments: slight antalgic gait; decreased stance right lower extremity                                                                                                                                TREATMENT DATE: 12/13/23 physical therapy evaluation and HEP instruction    PATIENT EDUCATION:  Education details: Patient educated on exam findings, POC, scope of PT, HEP, and what to expect next visit. Person educated: Patient Education method: Explanation, Demonstration, and Handouts Education comprehension: verbalized understanding, returned demonstration, verbal cues required, and  tactile cues required  HOME EXERCISE PROGRAM: Access Code: [email or web address] URL: [email or web address] Date: 12/13/2023 Prepared by: [facility]  Exercises - Supine Quad Set  - 2 x daily - 7 x weekly - 1 sets - 10 reps - 5 sec hold - Supine Knee Extension Stretch on Towel Roll  - 2  x daily - 7 x weekly - 1 sets - 1 reps - 3 minutes hold - Supine Hamstring Stretch  - 2 x daily - 7 x weekly - 1 sets - 5 reps - 20 sec hold  ASSESSMENT:  CLINICAL IMPRESSION: Patient is a 89 y.o. female who was seen today for physical therapy evaluation and treatment for M62.81 (ICD-10-CM) - Muscle weakness (generalized).  Patient demonstrates muscle weakness, reduced ROM, and fascial restrictions which are likely contributing to symptoms of pain and are negatively impacting patient ability to perform ADLs and functional mobility tasks. Patient will benefit from skilled physical therapy services to address these deficits to reduce pain and improve level of function with ADLs and functional mobility tasks.   OBJECTIVE IMPAIRMENTS: Abnormal gait, decreased activity tolerance, decreased mobility, difficulty walking, decreased ROM, decreased strength, hypomobility, increased edema, increased fascial restrictions, impaired perceived functional ability, and pain.   ACTIVITY LIMITATIONS: standing, stairs, and locomotion level  PARTICIPATION LIMITATIONS: shopping, community activity, and yard work  [facility] POTENTIAL: Good  CLINICAL DECISION MAKING: Stable/uncomplicated  EVALUATION COMPLEXITY: Low   GOALS: Goals reviewed with patient? No  SHORT TERM GOALS: Target date: 12/27/23 patient will be independent with initial HEP  Baseline: Goal status: INITIAL  2.  Patient will self report 50% improvement to improve tolerance for functional activity  Baseline:  Goal status: INITIAL  3.  Patient will increase right knee extension to -4 or better to improve knee extension with heel strike Baseline:  Goal  status: INITIAL   LONG TERM GOALS: Target date: 01/04/2024  Patient will be independent in self management strategies to improve quality of life and functional outcomes.  Baseline:  Goal status: INITIAL  2.  Patient will self report 75% improvement to improve tolerance for functional activity  Baseline:  Goal status: INITIAL  3.  Patient will increase right knee extension to -2 or better to improve heel strike and knee extension with ambulation level and unlevel surfaces Baseline:  Goal status: INITIAL  4.  Patient will increase right  leg MMTs to 4+ to 5/5 without pain to promote return to ambulation community distances with minimal deviation.  Baseline:  Goal status: INITIAL  5.  Patient will improve 5 times sit to stand score to 15 sec or less to demonstrate improved functional mobility and increased leg strength.   Baseline: 18.79 sec Goal status: INITIAL    PLAN:  PT FREQUENCY: 2x/week  PT DURATION: 3 weeks  PLANNED INTERVENTIONS: 97164- PT Re-evaluation, 97110-Therapeutic exercises, 97530- Therapeutic activity, 97112- Neuromuscular re-education, 97535- Self Care, 02725- Manual therapy, [phone]- Gait training, [phone]- Orthotic Fit/training, [phone]- Canalith repositioning, [id number]- Aquatic Therapy, [phone]- Splinting, Patient/Family education, Balance training, Stair training, Taping, Dry Needling, Joint mobilization, Joint manipulation, Spinal manipulation, Spinal mobilization, Scar mobilization, and DME instructions.   PLAN FOR NEXT SESSION: Review HEP and goals; Right knee mobility and strength  10:02 AM, 12/13/23 Adyan Palau Small Aoife Bold MPT [place] [facility] [place] [phone] Ph:[phone]

## 2023-12-13 ENCOUNTER — Other Ambulatory Visit: Payer: Self-pay

## 2023-12-13 ENCOUNTER — Ambulatory Visit (HOSPITAL_COMMUNITY): Payer: Medicare Other | Attending: Internal Medicine

## 2023-12-13 DIAGNOSIS — G8929 Other chronic pain: Secondary | ICD-10-CM | POA: Diagnosis not present

## 2023-12-13 DIAGNOSIS — R262 Difficulty in walking, not elsewhere classified: Secondary | ICD-10-CM | POA: Diagnosis not present

## 2023-12-13 DIAGNOSIS — M25561 Pain in right knee: Secondary | ICD-10-CM | POA: Insufficient documentation

## 2023-12-13 DIAGNOSIS — M6281 Muscle weakness (generalized): Secondary | ICD-10-CM | POA: Insufficient documentation

## 2023-12-17 ENCOUNTER — Telehealth (HOSPITAL_COMMUNITY): Payer: Self-pay

## 2023-12-17 ENCOUNTER — Encounter (HOSPITAL_COMMUNITY): Payer: Medicare Other

## 2023-12-17 NOTE — Telephone Encounter (Signed)
No show #1; called and left message with patient regarding missed appt this morning and reminded her of her next appt 1/22 at 8:45.  9:19 AM, 12/17/23 Miranda [name] Small [name] MPT [place] [facility] [place] [phone] Ph:[phone]

## 2023-12-17 NOTE — Telephone Encounter (Signed)
Patient called to state her car would not start this morning.  9:27 AM, 12/17/23 Miranda [name] Small [name] MPT [place] [facility] [place] [phone]

## 2023-12-19 ENCOUNTER — Encounter (HOSPITAL_COMMUNITY): Payer: Self-pay

## 2023-12-19 ENCOUNTER — Ambulatory Visit (HOSPITAL_COMMUNITY): Payer: Medicare Other

## 2023-12-19 DIAGNOSIS — M6281 Muscle weakness (generalized): Secondary | ICD-10-CM

## 2023-12-19 DIAGNOSIS — R262 Difficulty in walking, not elsewhere classified: Secondary | ICD-10-CM

## 2023-12-19 DIAGNOSIS — G8929 Other chronic pain: Secondary | ICD-10-CM | POA: Diagnosis not present

## 2023-12-19 DIAGNOSIS — M25561 Pain in right knee: Secondary | ICD-10-CM | POA: Diagnosis not present

## 2023-12-19 NOTE — Therapy (Signed)
OUTPATIENT PHYSICAL THERAPY LOWER EXTREMITY EVALUATION   Patient Name: Miranda [name] MRN: [id number] DOB:03/16/1934, 89 y.o., female Today's Date: 12/19/2023  END OF SESSION:  PT End of Session - 12/19/23 0841     Visit Number 2    Number of Visits 6    Date for PT Re-Evaluation 01/04/24    Authorization Type Medicare    Progress Note Due on Visit 10    PT Start Time [id number]             Past Medical History:  Diagnosis Date   Asthma    Breast cancer (HCC) 2020   INVASIVE DUCTAL CARCINOMA/DUCTAL CARCINOMA IN SITU/MARGINS UNINVOLVED BY CARCINOMA   Essential hypertension, benign    History of kidney stones    Mixed hyperlipidemia    Past Surgical History:  Procedure Laterality Date   ABDOMINAL HYSTERECTOMY     excessive bleeding, no cancer    BREAST LUMPECTOMY Right 2020   INVASIVE DUCTAL CARCINOMA/DUCTAL CARCINOMA IN situ/MARGINS UNINVOLVED BY CARCINOMA   CARPAL TUNNEL RELEASE Left 09/06/2021   Procedure: CARPAL TUNNEL RELEASE;  Surgeon: Vickki Hearing, MD;  Location: [facility];  Service: Orthopedics;  Laterality: Left;   GASTRORRHAPHY N/A 03/21/2023   Procedure: GASTRORRHAPHY;  Surgeon: Franky Macho, MD;  Location: [facility];  Service: General;  Laterality: N/A;   LAPAROTOMY N/A 03/21/2023   Procedure: EXPLORATORY LAPAROTOMY;  Surgeon: Franky Macho, MD;  Location: [facility];  Service: General;  Laterality: N/A;   Left knee arthroscopy due to torn ligament and cartiallege  2006   [place]    Left lung - lobectomy lower lobe - lung cancer     RADIOACTIVE SEED GUIDED PARTIAL MASTECTOMY WITH AXILLARY SENTINEL LYMPH NODE BIOPSY Right 06/24/2019   Procedure: RADIOACTIVE SEED GUIDED RIGHT BREAST PARTIAL MASTECTOMY WITH  SENTINEL LYMPH NODE BIOPSY;  Surgeon: Abigail Miyamoto, MD;  Location: [facility];  Service: General;  Laterality: Right;   Patient Active Problem List   Diagnosis Date Noted   Perforated viscus 03/21/2023   History of breast cancer 03/21/2023   Hypotension due  to hypovolemia 03/21/2023   Perforated peptic ulcer (HCC) 03/20/2023   Carpal tunnel syndrome, left upper limb    Malignant neoplasm of lower-inner quadrant of right breast of female, estrogen receptor positive (HCC) 06/12/2019   Spondylolisthesis of lumbar region 02/03/2018   Lumbar spondylosis 02/03/2018   PSVT (paroxysmal supraventricular tachycardia) (HCC) 01/05/2012   Near syncope 12/05/2011   Palpitations 12/05/2011   Abnormal ECG 12/05/2011   HYPERLIPIDEMIA 11/10/2006   Essential hypertension, benign 11/10/2006    PCP: Benita Stabile, MD  REFERRING PROVIDER: Benita Stabile, MD  REFERRING DIAG: [phone] (ICD-10-CM) - Muscle weakness (generalized)  THERAPY DIAG:  Muscle weakness (generalized)  Difficulty in walking, not elsewhere classified  Chronic pain of right knee  Rationale for Evaluation and Treatment: Rehabilitation  ONSET DATE: April of 2024  SUBJECTIVE:   SUBJECTIVE STATEMENT: 12/19/23:  Reports knee is stiff due to the cold weather.  Has began HEP with reports of some difficulty getting on and off the floor.  Eval:Had some surgery in April of this past year and legs feel weak since then; was in the hospital for about 9 days.  Surgery was on the stomach. Saw Dr. Margo Aye and he referred for physical therapy.  She attends the [employer] a couple times a week for activities; games and such.  Lives alone but her son checks on her frequently. Her Right knee buckles sometimes  PERTINENT HISTORY: Left knee  surgery years ago per Dr. Romeo Apple 2006 PAIN:  Are you having pain? No  PRECAUTIONS: None  RED FLAGS: None   WEIGHT BEARING RESTRICTIONS: No  FALLS:  Has patient fallen in last 6 months? No   OCCUPATION: retired  PLOF: Independent  PATIENT GOALS: get my legs stronger  NEXT MD VISIT: every 6 months; next appt in March  OBJECTIVE:  Note: Objective measures were completed at Evaluation unless otherwise noted.  DIAGNOSTIC FINDINGS: none  PATIENT  SURVEYS:  LEFS 75/80 93.8%  COGNITION: Overall cognitive status: Within functional limits for tasks assessed     SENSATION: WFL  EDEMA:  Right knee swelling noted  POSTURE: rounded shoulders and forward head  PALPATION: Puffiness noted right knee; valgus and extension lag  LOWER EXTREMITY ROM:  Active ROM Right eval Left eval  Hip flexion    Hip extension    Hip abduction    Hip adduction    Hip internal rotation    Hip external rotation    Knee flexion 128 133  Knee extension -7 0  Ankle dorsiflexion    Ankle plantarflexion    Ankle inversion    Ankle eversion     (Blank rows = not tested)  LOWER EXTREMITY MMT:  MMT Right eval Left eval  Hip flexion 4- 5  Hip extension    Hip abduction    Hip adduction    Hip internal rotation    Hip external rotation    Knee flexion    Knee extension 4 5  Ankle dorsiflexion 4+ 5  Ankle plantarflexion    Ankle inversion    Ankle eversion     (Blank rows = not tested)  FUNCTIONAL TESTS:  5 times sit to stand: 18.79 sec using hands on thighs 2 minute walk test: 12/19/23:  340ft no LOB;  forward flexed posture, NBOS, scissor gait, Rt hip drop  GAIT: Distance walked: 80 ft in clinic  Assistive device utilized: None Level of assistance: Modified independence Comments: slight antalgic gait; decreased stance right lower extremity                                                                                                                                TREATMENT DATE:  12/19/23:   Reviewed goals Educated importance of HEP compliance Encouraged to complete exercises on bed vs floor due to difficulty transferring  Bike 5' seat 8 : 310ft no LOB;  forward flexed posture, NBOS, scissor, Rt hip drop  Supine: Quad set 10x 5" Hamstring stretch 2x 30" with hands behind knee Bridge 10x 5"  12/13/23 physical therapy evaluation and HEP instruction    PATIENT EDUCATION:  Education details: Patient educated on exam  findings, POC, scope of PT, HEP, and what to expect next visit. Person educated: Patient Education method: Explanation, Demonstration, and Handouts Education comprehension: verbalized understanding, returned demonstration, verbal cues required, and tactile cues required  HOME EXERCISE PROGRAM: Access Code: [email or web address] URL: [email or web address]  Date: 12/13/2023 Prepared by: [facility]  Exercises - Supine Quad Set  - 2 x daily - 7 x weekly - 1 sets - 10 reps - 5 sec hold - Supine Knee Extension Stretch on Towel Roll  - 2 x daily - 7 x weekly - 1 sets - 1 reps - 3 minutes hold - Supine Hamstring Stretch  - 2 x daily - 7 x weekly - 1 sets - 5 reps - 20 sec hold  ASSESSMENT:  CLINICAL IMPRESSION: 12/19/23:  Reviewed goals and educated importance of HEP compliance for maximal benefits.  Pt able to recall and reports compliance, encouraged pt to complete exercises on bed to reduce difficulty transferring to and from floor.  Session focus with knee mobility and LE strengthening.  with noted gluteal weakness with NBOS, scissor mechanics and Rt hip drop.  Added gluteal strengthening exercises to POC.  Pt tolerated well to session with no reports of increased pain.  Was limited by appropriate levels of fatigue.    Eval:  Patient is a 89 y.o. female who was seen today for physical therapy evaluation and treatment for M62.81 (ICD-10-CM) - Muscle weakness (generalized).  Patient demonstrates muscle weakness, reduced ROM, and fascial restrictions which are likely contributing to symptoms of pain and are negatively impacting patient ability to perform ADLs and functional mobility tasks. Patient will benefit from skilled physical therapy services to address these deficits to reduce pain and improve level of function with ADLs and functional mobility tasks.   OBJECTIVE IMPAIRMENTS: Abnormal gait, decreased activity tolerance, decreased mobility, difficulty walking, decreased ROM, decreased  strength, hypomobility, increased edema, increased fascial restrictions, impaired perceived functional ability, and pain.   ACTIVITY LIMITATIONS: standing, stairs, and locomotion level  PARTICIPATION LIMITATIONS: shopping, community activity, and yard work  [facility] POTENTIAL: Good  CLINICAL DECISION MAKING: Stable/uncomplicated  EVALUATION COMPLEXITY: Low   GOALS: Goals reviewed with patient? No  SHORT TERM GOALS: Target date: 12/27/23 patient will be independent with initial HEP  Baseline: Goal status: INITIAL  2.  Patient will self report 50% improvement to improve tolerance for functional activity  Baseline:  Goal status: INITIAL  3.  Patient will increase right knee extension to -4 or better to improve knee extension with heel strike Baseline:  Goal status: INITIAL   LONG TERM GOALS: Target date: 01/04/2024  Patient will be independent in self management strategies to improve quality of life and functional outcomes.  Baseline:  Goal status: INITIAL  2.  Patient will self report 75% improvement to improve tolerance for functional activity  Baseline:  Goal status: INITIAL  3.  Patient will increase right knee extension to -2 or better to improve heel strike and knee extension with ambulation level and unlevel surfaces Baseline:  Goal status: INITIAL  4.  Patient will increase right  leg MMTs to 4+ to 5/5 without pain to promote return to ambulation community distances with minimal deviation.  Baseline:  Goal status: INITIAL  5.  Patient will improve 5 times sit to stand score to 15 sec or less to demonstrate improved functional mobility and increased leg strength.   Baseline: 18.79 sec Goal status: INITIAL    PLAN:  PT FREQUENCY: 2x/week  PT DURATION: 3 weeks  PLANNED INTERVENTIONS: 97164- PT Re-evaluation, 97110-Therapeutic exercises, 97530- Therapeutic activity, 97112- Neuromuscular re-education, 97535- Self Care, 16109- Manual therapy, [phone]- Gait  training, [phone]- Orthotic Fit/training, [phone]- Canalith repositioning, [id number]- Aquatic Therapy, [phone]- Splinting, Patient/Family education, Balance training, Stair training, Taping, Dry Needling, Joint mobilization,  Joint manipulation, Spinal manipulation, Spinal mobilization, Scar mobilization, and DME instructions.   PLAN FOR NEXT SESSION: Right knee mobility and strength  Becky Sax, LPTA/CLT; [facility] [phone]  Juel Burrow, PTA 12/19/2023, 9:14 AM  8:42 AM, 12/19/23

## 2023-12-24 ENCOUNTER — Ambulatory Visit (HOSPITAL_COMMUNITY): Payer: Medicare Other

## 2023-12-24 DIAGNOSIS — G8929 Other chronic pain: Secondary | ICD-10-CM | POA: Diagnosis not present

## 2023-12-24 DIAGNOSIS — M6281 Muscle weakness (generalized): Secondary | ICD-10-CM | POA: Diagnosis not present

## 2023-12-24 DIAGNOSIS — R262 Difficulty in walking, not elsewhere classified: Secondary | ICD-10-CM | POA: Diagnosis not present

## 2023-12-24 DIAGNOSIS — M25561 Pain in right knee: Secondary | ICD-10-CM | POA: Diagnosis not present

## 2023-12-24 NOTE — Therapy (Signed)
OUTPATIENT PHYSICAL THERAPY LOWER EXTREMITY EVALUATION   Patient Name: Miranda [name] MRN: [id number] DOB:14-May-1934, 89 y.o., female Today's Date: 12/24/2023  END OF SESSION:  PT End of Session - 12/24/23 0909     Visit Number 3    Number of Visits 6    Date for PT Re-Evaluation 01/04/24    Authorization Type Medicare    Progress Note Due on Visit 10    PT Start Time 0909    PT Stop Time 0950    PT Time Calculation (min) 41 min    Activity Tolerance Patient tolerated treatment well    Behavior During Therapy [facility] for tasks assessed/performed             Past Medical History:  Diagnosis Date   Asthma    Breast cancer (HCC) 2020   INVASIVE DUCTAL CARCINOMA/DUCTAL CARCINOMA IN SITU/MARGINS UNINVOLVED BY CARCINOMA   Essential hypertension, benign    History of kidney stones    Mixed hyperlipidemia    Past Surgical History:  Procedure Laterality Date   ABDOMINAL HYSTERECTOMY     excessive bleeding, no cancer    BREAST LUMPECTOMY Right 2020   INVASIVE DUCTAL CARCINOMA/DUCTAL CARCINOMA IN situ/MARGINS UNINVOLVED BY CARCINOMA   CARPAL TUNNEL RELEASE Left 09/06/2021   Procedure: CARPAL TUNNEL RELEASE;  Surgeon: Vickki Hearing, MD;  Location: [facility];  Service: Orthopedics;  Laterality: Left;   GASTRORRHAPHY N/A 03/21/2023   Procedure: GASTRORRHAPHY;  Surgeon: Franky Macho, MD;  Location: [facility];  Service: General;  Laterality: N/A;   LAPAROTOMY N/A 03/21/2023   Procedure: EXPLORATORY LAPAROTOMY;  Surgeon: Franky Macho, MD;  Location: [facility];  Service: General;  Laterality: N/A;   Left knee arthroscopy due to torn ligament and cartiallege  2006   [place]    Left lung - lobectomy lower lobe - lung cancer     RADIOACTIVE SEED GUIDED PARTIAL MASTECTOMY WITH AXILLARY SENTINEL LYMPH NODE BIOPSY Right 06/24/2019   Procedure: RADIOACTIVE SEED GUIDED RIGHT BREAST PARTIAL MASTECTOMY WITH  SENTINEL LYMPH NODE BIOPSY;  Surgeon: Abigail Miyamoto, MD;  Location: [facility];   Service: General;  Laterality: Right;   Patient Active Problem List   Diagnosis Date Noted   Perforated viscus 03/21/2023   History of breast cancer 03/21/2023   Hypotension due to hypovolemia 03/21/2023   Perforated peptic ulcer (HCC) 03/20/2023   Carpal tunnel syndrome, left upper limb    Malignant neoplasm of lower-inner quadrant of right breast of female, estrogen receptor positive (HCC) 06/12/2019   Spondylolisthesis of lumbar region 02/03/2018   Lumbar spondylosis 02/03/2018   PSVT (paroxysmal supraventricular tachycardia) (HCC) 01/05/2012   Near syncope 12/05/2011   Palpitations 12/05/2011   Abnormal ECG 12/05/2011   HYPERLIPIDEMIA 11/10/2006   Essential hypertension, benign 11/10/2006    PCP: Benita Stabile, MD  REFERRING PROVIDER: Benita Stabile, MD  REFERRING DIAG: [phone] (ICD-10-CM) - Muscle weakness (generalized)  THERAPY DIAG:  Muscle weakness (generalized)  Difficulty in walking, not elsewhere classified  Chronic pain of right knee  Rationale for Evaluation and Treatment: Rehabilitation  ONSET DATE: April of 2024  SUBJECTIVE:   SUBJECTIVE STATEMENT: Patient reports knee if feeling stiff; no better but no worse; 5/10 pain  Eval:Had some surgery in April of this past year and legs feel weak since then; was in the hospital for about 9 days.  Surgery was on the stomach. Saw Dr. Margo Aye and he referred for physical therapy.  She attends the [employer] a couple times a week for activities; games  and such.  Lives alone but her son checks on her frequently. Her Right knee buckles sometimes  PERTINENT HISTORY: Left knee surgery years ago per Dr. Romeo Apple 2006 PAIN:  Are you having pain? No  PRECAUTIONS: None  RED FLAGS: None   WEIGHT BEARING RESTRICTIONS: No  FALLS:  Has patient fallen in last 6 months? No   OCCUPATION: retired  PLOF: Independent  PATIENT GOALS: get my legs stronger  NEXT MD VISIT: every 6 months; next appt in March  OBJECTIVE:   Note: Objective measures were completed at Evaluation unless otherwise noted.  DIAGNOSTIC FINDINGS: none  PATIENT SURVEYS:  LEFS 75/80 93.8%  COGNITION: Overall cognitive status: Within functional limits for tasks assessed     SENSATION: WFL  EDEMA:  Right knee swelling noted  POSTURE: rounded shoulders and forward head  PALPATION: Puffiness noted right knee; valgus and extension lag  LOWER EXTREMITY ROM:  Active ROM Right eval Left eval Right 12/24/23  Hip flexion     Hip extension     Hip abduction     Hip adduction     Hip internal rotation     Hip external rotation     Knee flexion 128 133 131  Knee extension -7 0 -2  Ankle dorsiflexion     Ankle plantarflexion     Ankle inversion     Ankle eversion      (Blank rows = not tested)  LOWER EXTREMITY MMT:  MMT Right eval Left eval  Hip flexion 4- 5  Hip extension    Hip abduction    Hip adduction    Hip internal rotation    Hip external rotation    Knee flexion    Knee extension 4 5  Ankle dorsiflexion 4+ 5  Ankle plantarflexion    Ankle inversion    Ankle eversion     (Blank rows = not tested)  FUNCTIONAL TESTS:  5 times sit to stand: 18.79 sec using hands on thighs 2 minute walk test: 12/19/23:  393ft no LOB;  forward flexed posture, NBOS, scissor gait, Rt hip drop  GAIT: Distance walked: 80 ft in clinic  Assistive device utilized: None Level of assistance: Modified independence Comments: slight antalgic gait; decreased stance right lower extremity                                                                                                                                TREATMENT DATE:  12/24/23 Bike seat 8 x 5' dynamic warm up Standing: Heel raises 2 x 10 Hamstring stretch 5 x 20" on 6" step Slant board stretch 5 x 20"  Hip abduction 2 x 10 Hip extension 2 x 10 Seated Hamstring stretch with strap 5 x 20" Supine: AROM right knee -2 to 131 SLR 2 x 5 SAQ's 2# 3 x  10        12/19/23:   Reviewed goals Educated importance of HEP compliance Encouraged to complete  exercises on bed vs floor due to difficulty transferring  Bike 5' seat 8 : 386ft no LOB;  forward flexed posture, NBOS, scissor, Rt hip drop  Supine: Quad set 10x 5" Hamstring stretch 2x 30" with hands behind knee Bridge 10x 5"  12/13/23 physical therapy evaluation and HEP instruction    PATIENT EDUCATION:  Education details: Patient educated on exam findings, POC, scope of PT, HEP, and what to expect next visit. Person educated: Patient Education method: Explanation, Demonstration, and Handouts Education comprehension: verbalized understanding, returned demonstration, verbal cues required, and tactile cues required  HOME EXERCISE PROGRAM: 12/24/23 - Supine Bridge  - 2 x daily - 7 x weekly - 1 sets - 10 reps - 5" hold - Straight Leg Raise  - 2 x daily - 7 x weekly - 1 sets - 10 reps - Clam with Resistance  - 2 x daily - 7 x weekly - 1 sets - 10 reps - 5" hold - Short Arc Quad with Ankle Weight  - 1 x daily - 7 x weekly - 2 sets - 10 reps Access Code: [email or web address] URL: [email or web address] Date: 12/13/2023 Prepared by: [facility] - [facility]  Exercises - Supine Quad Set  - 2 x daily - 7 x weekly - 1 sets - 10 reps - 5 sec hold - Supine Knee Extension Stretch on Towel Roll  - 2 x daily - 7 x weekly - 1 sets - 1 reps - 3 minutes hold - Supine Hamstring Stretch  - 2 x daily - 7 x weekly - 1 sets - 5 reps - 20 sec hold  ASSESSMENT:  CLINICAL IMPRESSION: Patient continues with slight extension lag right knee so focus on hamstring and calf stretching. Patient needs cues for form with hip exercises; she tends to externally rotate both legs with hip abduction and use trunk substitution with hip extension.  Noted good improvement with right knee mobility today especially extension.  She does have some swelling in that right popliteal fossa that may be causing some discomfort.   Added short arc quads today for end range knee extension strengthening; moderate difficulty with SLR.  Patient will benefit from continued skilled therapy services to address deficits and promote return to optimal function.        Eval:  Patient is a 89 y.o. female who was seen today for physical therapy evaluation and treatment for M62.81 (ICD-10-CM) - Muscle weakness (generalized).  Patient demonstrates muscle weakness, reduced ROM, and fascial restrictions which are likely contributing to symptoms of pain and are negatively impacting patient ability to perform ADLs and functional mobility tasks. Patient will benefit from skilled physical therapy services to address these deficits to reduce pain and improve level of function with ADLs and functional mobility tasks.   OBJECTIVE IMPAIRMENTS: Abnormal gait, decreased activity tolerance, decreased mobility, difficulty walking, decreased ROM, decreased strength, hypomobility, increased edema, increased fascial restrictions, impaired perceived functional ability, and pain.   ACTIVITY LIMITATIONS: standing, stairs, and locomotion level  PARTICIPATION LIMITATIONS: shopping, community activity, and yard work  [facility] POTENTIAL: Good  CLINICAL DECISION MAKING: Stable/uncomplicated  EVALUATION COMPLEXITY: Low   GOALS: Goals reviewed with patient? No  SHORT TERM GOALS: Target date: 12/27/23 patient will be independent with initial HEP  Baseline: Goal status: INITIAL  2.  Patient will self report 50% improvement to improve tolerance for functional activity  Baseline:  Goal status: INITIAL  3.  Patient will increase right knee extension to -4 or better to improve knee extension with heel strike Baseline:  Goal status: INITIAL   LONG TERM GOALS: Target date: 01/04/2024  Patient will be independent in self management strategies to improve quality of life and functional outcomes.  Baseline:  Goal status: INITIAL  2.  Patient will self  report 75% improvement to improve tolerance for functional activity  Baseline:  Goal status: INITIAL  3.  Patient will increase right knee extension to -2 or better to improve heel strike and knee extension with ambulation level and unlevel surfaces Baseline:  Goal status: INITIAL  4.  Patient will increase right  leg MMTs to 4+ to 5/5 without pain to promote return to ambulation community distances with minimal deviation.  Baseline:  Goal status: INITIAL  5.  Patient will improve 5 times sit to stand score to 15 sec or less to demonstrate improved functional mobility and increased leg strength.   Baseline: 18.79 sec Goal status: INITIAL    PLAN:  PT FREQUENCY: 2x/week  PT DURATION: 3 weeks  PLANNED INTERVENTIONS: 97164- PT Re-evaluation, 97110-Therapeutic exercises, 97530- Therapeutic activity, 97112- Neuromuscular re-education, 97535- Self Care, 65784- Manual therapy, [phone]- Gait training, [phone]- Orthotic Fit/training, [phone]- Canalith repositioning, [id number]- Aquatic Therapy, [phone]- Splinting, Patient/Family education, Balance training, Stair training, Taping, Dry Needling, Joint mobilization, Joint manipulation, Spinal manipulation, Spinal mobilization, Scar mobilization, and DME instructions.   PLAN FOR NEXT SESSION: Right knee mobility and strength; add TKE next visit  9:52 AM, 12/24/23 Valeree Leidy Small Rodney Yera MPT [facility] [place] [phone] Ph:[phone]

## 2023-12-26 ENCOUNTER — Encounter (HOSPITAL_COMMUNITY): Payer: Self-pay

## 2023-12-26 ENCOUNTER — Ambulatory Visit (HOSPITAL_COMMUNITY): Payer: Medicare Other

## 2023-12-26 DIAGNOSIS — G8929 Other chronic pain: Secondary | ICD-10-CM | POA: Diagnosis not present

## 2023-12-26 DIAGNOSIS — M6281 Muscle weakness (generalized): Secondary | ICD-10-CM

## 2023-12-26 DIAGNOSIS — M25561 Pain in right knee: Secondary | ICD-10-CM | POA: Diagnosis not present

## 2023-12-26 DIAGNOSIS — R262 Difficulty in walking, not elsewhere classified: Secondary | ICD-10-CM | POA: Diagnosis not present

## 2023-12-26 NOTE — Therapy (Signed)
OUTPATIENT PHYSICAL THERAPY LOWER EXTREMITY TREATMENT   Patient Name: Miranda [name] MRN: [id number] DOB:1934/08/19, 89 y.o., female Today's Date: 12/26/2023  END OF SESSION:  PT End of Session - 12/26/23 0933     Visit Number 4    Number of Visits 6    Date for PT Re-Evaluation 01/04/24    Authorization Type Medicare    Progress Note Due on Visit 10    PT Start Time 0934    PT Stop Time 1012    PT Time Calculation (min) 38 min    Activity Tolerance Patient tolerated treatment well    Behavior During Therapy [facility] for tasks assessed/performed             Past Medical History:  Diagnosis Date   Asthma    Breast cancer (HCC) 2020   INVASIVE DUCTAL CARCINOMA/DUCTAL CARCINOMA IN SITU/MARGINS UNINVOLVED BY CARCINOMA   Essential hypertension, benign    History of kidney stones    Mixed hyperlipidemia    Past Surgical History:  Procedure Laterality Date   ABDOMINAL HYSTERECTOMY     excessive bleeding, no cancer    BREAST LUMPECTOMY Right 2020   INVASIVE DUCTAL CARCINOMA/DUCTAL CARCINOMA IN situ/MARGINS UNINVOLVED BY CARCINOMA   CARPAL TUNNEL RELEASE Left 09/06/2021   Procedure: CARPAL TUNNEL RELEASE;  Surgeon: Vickki Hearing, MD;  Location: [facility];  Service: Orthopedics;  Laterality: Left;   GASTRORRHAPHY N/A 03/21/2023   Procedure: GASTRORRHAPHY;  Surgeon: Franky Macho, MD;  Location: [facility];  Service: General;  Laterality: N/A;   LAPAROTOMY N/A 03/21/2023   Procedure: EXPLORATORY LAPAROTOMY;  Surgeon: Franky Macho, MD;  Location: [facility];  Service: General;  Laterality: N/A;   Left knee arthroscopy due to torn ligament and cartiallege  2006   [place]    Left lung - lobectomy lower lobe - lung cancer     RADIOACTIVE SEED GUIDED PARTIAL MASTECTOMY WITH AXILLARY SENTINEL LYMPH NODE BIOPSY Right 06/24/2019   Procedure: RADIOACTIVE SEED GUIDED RIGHT BREAST PARTIAL MASTECTOMY WITH  SENTINEL LYMPH NODE BIOPSY;  Surgeon: Abigail Miyamoto, MD;  Location: [facility];  Service:  General;  Laterality: Right;   Patient Active Problem List   Diagnosis Date Noted   Perforated viscus 03/21/2023   History of breast cancer 03/21/2023   Hypotension due to hypovolemia 03/21/2023   Perforated peptic ulcer (HCC) 03/20/2023   Carpal tunnel syndrome, left upper limb    Malignant neoplasm of lower-inner quadrant of right breast of female, estrogen receptor positive (HCC) 06/12/2019   Spondylolisthesis of lumbar region 02/03/2018   Lumbar spondylosis 02/03/2018   PSVT (paroxysmal supraventricular tachycardia) (HCC) 01/05/2012   Near syncope 12/05/2011   Palpitations 12/05/2011   Abnormal ECG 12/05/2011   HYPERLIPIDEMIA 11/10/2006   Essential hypertension, benign 11/10/2006    PCP: Benita Stabile, MD  REFERRING PROVIDER: Benita Stabile, MD  REFERRING DIAG: [phone] (ICD-10-CM) - Muscle weakness (generalized)  THERAPY DIAG:  Muscle weakness (generalized)  Difficulty in walking, not elsewhere classified  Chronic pain of right knee  Rationale for Evaluation and Treatment: Rehabilitation  ONSET DATE: April of 2024  SUBJECTIVE:   SUBJECTIVE STATEMENT: Pain in posterior knee, no pain currenlty but can increase to 5/10.  Reports knee buckles, no reports of recent fall.  Eval:Had some surgery in April of this past year and legs feel weak since then; was in the hospital for about 9 days.  Surgery was on the stomach. Saw Dr. Margo Aye and he referred for physical therapy.  She attends the [employer]  a couple times a week for activities; games and such.  Lives alone but her son checks on her frequently. Her Right knee buckles sometimes  PERTINENT HISTORY: Left knee surgery years ago per Dr. Romeo Apple 2006 PAIN:  Are you having pain? No  PRECAUTIONS: None  RED FLAGS: None   WEIGHT BEARING RESTRICTIONS: No  FALLS:  Has patient fallen in last 6 months? No   OCCUPATION: retired  PLOF: Independent  PATIENT GOALS: get my legs stronger  NEXT MD VISIT: every 6 months;  next appt in March  OBJECTIVE:  Note: Objective measures were completed at Evaluation unless otherwise noted.  DIAGNOSTIC FINDINGS: none  PATIENT SURVEYS:  LEFS 75/80 93.8%  COGNITION: Overall cognitive status: Within functional limits for tasks assessed     SENSATION: WFL  EDEMA:  Right knee swelling noted  POSTURE: rounded shoulders and forward head  PALPATION: Puffiness noted right knee; valgus and extension lag  LOWER EXTREMITY ROM:  Active ROM Right eval Left eval Right 12/24/23  Hip flexion     Hip extension     Hip abduction     Hip adduction     Hip internal rotation     Hip external rotation     Knee flexion 128 133 131  Knee extension -7 0 -2  Ankle dorsiflexion     Ankle plantarflexion     Ankle inversion     Ankle eversion      (Blank rows = not tested)  LOWER EXTREMITY MMT:  MMT Right eval Left eval  Hip flexion 4- 5  Hip extension    Hip abduction    Hip adduction    Hip internal rotation    Hip external rotation    Knee flexion    Knee extension 4 5  Ankle dorsiflexion 4+ 5  Ankle plantarflexion    Ankle inversion    Ankle eversion     (Blank rows = not tested)  FUNCTIONAL TESTS:  5 times sit to stand: 18.79 sec using hands on thighs 2 minute walk test: 12/19/23:  3102ft no LOB;  forward flexed posture, NBOS, scissor gait, Rt hip drop  GAIT: Distance walked: 80 ft in clinic  Assistive device utilized: None Level of assistance: Modified independence Comments: slight antalgic gait; decreased stance right lower extremity                                                                                                                                TREATMENT DATE:  12/26/23 Bike seat 8 x 5' L3 resistance dynamic warm up Standing: Heel raises 15x 5" TKE 10x 5" STS 10x eccentric control  Forward step up 4in 10x Lateral step up 4in 10x [place] front of chair 10x Sidestep GTB around thigh 3RT Slant board 3x 30" Tandem stance 2x  30"   12/24/23 Bike seat 8 x 5' dynamic warm up Standing: Heel raises 2 x 10 Hamstring stretch 5 x 20" on 6"  step Slant board stretch 5 x 20"  Hip abduction 2 x 10 Hip extension 2 x 10 Seated Hamstring stretch with strap 5 x 20" Supine: AROM right knee -2 to 131 SLR 2 x 5 SAQ's 2# 3 x 10        12/19/23:   Reviewed goals Educated importance of HEP compliance Encouraged to complete exercises on bed vs floor due to difficulty transferring  Bike 5' seat 8 : 345ft no LOB;  forward flexed posture, NBOS, scissor, Rt hip drop  Supine: Quad set 10x 5" Hamstring stretch 2x 30" with hands behind knee Bridge 10x 5"  12/13/23 physical therapy evaluation and HEP instruction    PATIENT EDUCATION:  Education details: Patient educated on exam findings, POC, scope of PT, HEP, and what to expect next visit. Person educated: Patient Education method: Explanation, Demonstration, and Handouts Education comprehension: verbalized understanding, returned demonstration, verbal cues required, and tactile cues required  HOME EXERCISE PROGRAM: 12/24/23 - Supine Bridge  - 2 x daily - 7 x weekly - 1 sets - 10 reps - 5" hold - Straight Leg Raise  - 2 x daily - 7 x weekly - 1 sets - 10 reps - Clam with Resistance  - 2 x daily - 7 x weekly - 1 sets - 10 reps - 5" hold - Short Arc Quad with Ankle Weight  - 1 x daily - 7 x weekly - 2 sets - 10 reps Access Code: [email or web address] URL: [email or web address] Date: 12/13/2023 Prepared by: [facility] - Re[facility]  Exercises - Supine Quad Set  - 2 x daily - 7 x weekly - 1 sets - 10 reps - 5 sec hold - Supine Knee Extension Stretch on Towel Roll  - 2 x daily - 7 x weekly - 1 sets - 1 reps - 3 minutes hold - Supine Hamstring Stretch  - 2 x daily - 7 x weekly - 1 sets - 5 reps - 20 sec hold  ASSESSMENT:  CLINICAL IMPRESSION: Session focus with quad strengthening to address extension lag and functional strengthening.  Added exercises to target TKE,  functional strengthening with step up training and balance activities for hip strengthening.  Pt required min cueing for proper mechanics initially then able to complete with good mechanics.  Some cueing for posture through session as pt present with flexed trunk.  No reports of increased pain at EOS.   Eval:  Patient is a 89 y.o. female who was seen today for physical therapy evaluation and treatment for M62.81 (ICD-10-CM) - Muscle weakness (generalized).  Patient demonstrates muscle weakness, reduced ROM, and fascial restrictions which are likely contributing to symptoms of pain and are negatively impacting patient ability to perform ADLs and functional mobility tasks. Patient will benefit from skilled physical therapy services to address these deficits to reduce pain and improve level of function with ADLs and functional mobility tasks.   OBJECTIVE IMPAIRMENTS: Abnormal gait, decreased activity tolerance, decreased mobility, difficulty walking, decreased ROM, decreased strength, hypomobility, increased edema, increased fascial restrictions, impaired perceived functional ability, and pain.   ACTIVITY LIMITATIONS: standing, stairs, and locomotion level  PARTICIPATION LIMITATIONS: shopping, community activity, and yard work  [facility] POTENTIAL: Good  CLINICAL DECISION MAKING: Stable/uncomplicated  EVALUATION COMPLEXITY: Low   GOALS: Goals reviewed with patient? No  SHORT TERM GOALS: Target date: 12/27/23 patient will be independent with initial HEP  Baseline: Goal status: INITIAL  2.  Patient will self report 50% improvement to improve tolerance for functional activity  Baseline:  Goal status:  INITIAL  3.  Patient will increase right knee extension to -4 or better to improve knee extension with heel strike Baseline:  Goal status: INITIAL   LONG TERM GOALS: Target date: 01/04/2024  Patient will be independent in self management strategies to improve quality of life and functional  outcomes.  Baseline:  Goal status: INITIAL  2.  Patient will self report 75% improvement to improve tolerance for functional activity  Baseline:  Goal status: INITIAL  3.  Patient will increase right knee extension to -2 or better to improve heel strike and knee extension with ambulation level and unlevel surfaces Baseline:  Goal status: INITIAL  4.  Patient will increase right  leg MMTs to 4+ to 5/5 without pain to promote return to ambulation community distances with minimal deviation.  Baseline:  Goal status: INITIAL  5.  Patient will improve 5 times sit to stand score to 15 sec or less to demonstrate improved functional mobility and increased leg strength.   Baseline: 18.79 sec Goal status: INITIAL    PLAN:  PT FREQUENCY: 2x/week  PT DURATION: 3 weeks  PLANNED INTERVENTIONS: 97164- PT Re-evaluation, 97110-Therapeutic exercises, 97530- Therapeutic activity, 97112- Neuromuscular re-education, 97535- Self Care, 16109- Manual therapy, [phone]- Gait training, [phone]- Orthotic Fit/training, [phone]- Canalith repositioning, [id number]- Aquatic Therapy, [phone]- Splinting, Patient/Family education, Balance training, Stair training, Taping, Dry Needling, Joint mobilization, Joint manipulation, Spinal manipulation, Spinal mobilization, Scar mobilization, and DME instructions.   PLAN FOR NEXT SESSION: Right knee mobility and strength.  Begin vector stance, leg press and step down as appropriate.    Becky Sax, LPTA/CLT; CBIS [phone]  7:44 PM, 12/26/23

## 2023-12-31 ENCOUNTER — Encounter (HOSPITAL_COMMUNITY): Payer: Self-pay | Admitting: Physical Therapy

## 2023-12-31 ENCOUNTER — Ambulatory Visit (HOSPITAL_COMMUNITY): Payer: Medicare Other | Attending: Internal Medicine

## 2023-12-31 DIAGNOSIS — M6281 Muscle weakness (generalized): Secondary | ICD-10-CM | POA: Insufficient documentation

## 2023-12-31 DIAGNOSIS — G8929 Other chronic pain: Secondary | ICD-10-CM | POA: Insufficient documentation

## 2023-12-31 DIAGNOSIS — M25561 Pain in right knee: Secondary | ICD-10-CM | POA: Insufficient documentation

## 2023-12-31 DIAGNOSIS — R262 Difficulty in walking, not elsewhere classified: Secondary | ICD-10-CM | POA: Insufficient documentation

## 2023-12-31 NOTE — Therapy (Signed)
OUTPATIENT PHYSICAL THERAPY LOWER EXTREMITY TREATMENT   Patient Name: Miranda [name] MRN: [id number] DOB:[birth year], 89 y.o., female Today's Date: 12/31/2023  END OF SESSION:  PT End of Session - 12/31/23 0942     Visit Number 5    Number of Visits 6    Date for PT Re-Evaluation 01/04/24    Progress Note Due on Visit 10    PT Start Time 0940    PT Stop Time 1018    PT Time Calculation (min) 38 min    Activity Tolerance Patient tolerated treatment well    Behavior During Therapy [facility] for tasks assessed/performed             Past Medical History:  Diagnosis Date   Asthma    Breast cancer (HCC) 2020   INVASIVE DUCTAL CARCINOMA/DUCTAL CARCINOMA IN SITU/MARGINS UNINVOLVED BY CARCINOMA   Essential hypertension, benign    History of kidney stones    Mixed hyperlipidemia    Past Surgical History:  Procedure Laterality Date   ABDOMINAL HYSTERECTOMY     excessive bleeding, no cancer    BREAST LUMPECTOMY Right 2020   INVASIVE DUCTAL CARCINOMA/DUCTAL CARCINOMA IN situ/MARGINS UNINVOLVED BY CARCINOMA   CARPAL TUNNEL RELEASE Left 09/06/2021   Procedure: CARPAL TUNNEL RELEASE;  Surgeon: Vickki Hearing, MD;  Location: [facility];  Service: Orthopedics;  Laterality: Left;   GASTRORRHAPHY N/A 03/21/2023   Procedure: GASTRORRHAPHY;  Surgeon: Franky Macho, MD;  Location: [facility];  Service: General;  Laterality: N/A;   LAPAROTOMY N/A 03/21/2023   Procedure: EXPLORATORY LAPAROTOMY;  Surgeon: Franky Macho, MD;  Location: [facility];  Service: General;  Laterality: N/A;   Left knee arthroscopy due to torn ligament and cartiallege  2006   [facility]    Left lung - lobectomy lower lobe - lung cancer     RADIOACTIVE SEED GUIDED PARTIAL MASTECTOMY WITH AXILLARY SENTINEL LYMPH NODE BIOPSY Right 06/24/2019   Procedure: RADIOACTIVE SEED GUIDED RIGHT BREAST PARTIAL MASTECTOMY WITH  SENTINEL LYMPH NODE BIOPSY;  Surgeon: Abigail Miyamoto, MD;  Location: [facility];  Service: General;  Laterality: Right;    Patient Active Problem List   Diagnosis Date Noted   Perforated viscus 03/21/2023   History of breast cancer 03/21/2023   Hypotension due to hypovolemia 03/21/2023   Perforated peptic ulcer (HCC) 03/20/2023   Carpal tunnel syndrome, left upper limb    Malignant neoplasm of lower-inner quadrant of right breast of female, estrogen receptor positive (HCC) 06/12/2019   Spondylolisthesis of lumbar region 02/03/2018   Lumbar spondylosis 02/03/2018   PSVT (paroxysmal supraventricular tachycardia) (HCC) 01/05/2012   Near syncope 12/05/2011   Palpitations 12/05/2011   Abnormal ECG 12/05/2011   HYPERLIPIDEMIA 11/10/2006   Essential hypertension, benign 11/10/2006    PCP: Benita Stabile, MD  REFERRING PROVIDER: Benita Stabile, MD  REFERRING DIAG: [phone] (ICD-10-CM) - Muscle weakness (generalized)  THERAPY DIAG:  Muscle weakness (generalized)  Difficulty in walking, not elsewhere classified  Chronic pain of right knee  Rationale for Evaluation and Treatment: Rehabilitation  ONSET DATE: April of 2024  SUBJECTIVE:   SUBJECTIVE STATEMENT: Knee is stiff today, posterior knee pain scale 5/10 intermittent achy pain.    Eval:Had some surgery in April of this past year and legs feel weak since then; was in the hospital for about 9 days.  Surgery was on the stomach. Saw Dr. Margo Aye and he referred for physical therapy.  She attends the [employer] a couple times a week for activities; games and such.  Lives alone  but her son checks on her frequently. Her Right knee buckles sometimes  PERTINENT HISTORY: Left knee surgery years ago per Dr. Romeo Apple 2006 PAIN:  Are you having pain? No  PRECAUTIONS: None  RED FLAGS: None   WEIGHT BEARING RESTRICTIONS: No  FALLS:  Has patient fallen in last 6 months? No   OCCUPATION: retired  PLOF: Independent  PATIENT GOALS: get my legs stronger  NEXT MD VISIT: every 6 months; next appt in March  OBJECTIVE:  Note: Objective measures were  completed at Evaluation unless otherwise noted.  DIAGNOSTIC FINDINGS: none  PATIENT SURVEYS:  LEFS 75/80 93.8%  COGNITION: Overall cognitive status: Within functional limits for tasks assessed     SENSATION: WFL  EDEMA:  Right knee swelling noted  POSTURE: rounded shoulders and forward head  PALPATION: Puffiness noted right knee; valgus and extension lag  LOWER EXTREMITY ROM:  Active ROM Right eval Left eval Right 12/24/23  Hip flexion     Hip extension     Hip abduction     Hip adduction     Hip internal rotation     Hip external rotation     Knee flexion 128 133 131  Knee extension -7 0 -2  Ankle dorsiflexion     Ankle plantarflexion     Ankle inversion     Ankle eversion      (Blank rows = not tested)  LOWER EXTREMITY MMT:  MMT Right eval Left eval  Hip flexion 4- 5  Hip extension    Hip abduction    Hip adduction    Hip internal rotation    Hip external rotation    Knee flexion    Knee extension 4 5  Ankle dorsiflexion 4+ 5  Ankle plantarflexion    Ankle inversion    Ankle eversion     (Blank rows = not tested)  FUNCTIONAL TESTS:  5 times sit to stand: 18.79 sec using hands on thighs 2 minute walk test: 12/19/23:  372ft no LOB;  forward flexed posture, NBOS, scissor gait, Rt hip drop  GAIT: Distance walked: 80 ft in clinic  Assistive device utilized: None Level of assistance: Modified independence Comments: slight antalgic gait; decreased stance right lower extremity                                                                                                                                TREATMENT DATE:  12/31/23: Bike seat 8 x 5' L3 resistance dynamic warm up STS to heel raise 10x 5" no HHA Forward step up 4in 10x Lateral step up 4in 10x Step down 4in 10x SLS Rt 5", Lt 3" max of 5 Leg press 4Pl 2x 10    12/26/23 Bike seat 8 x 5' L3 resistance dynamic warm up Standing: Heel raises 15x 5" TKE 10x 5" STS 10x eccentric control   Forward step up 4in 10x Lateral step up 4in 10x Minisquat front of chair 10x Sidestep  GTB around thigh 3RT Slant board 3x 30" Tandem stance 2x 30"   12/24/23 Bike seat 8 x 5' dynamic warm up Standing: Heel raises 2 x 10 Hamstring stretch 5 x 20" on 6" step Slant board stretch 5 x 20"  Hip abduction 2 x 10 Hip extension 2 x 10 Seated Hamstring stretch with strap 5 x 20" Supine: AROM right knee -2 to 131 SLR 2 x 5 SAQ's 2# 3 x 10        12/19/23:   Reviewed goals Educated importance of HEP compliance Encouraged to complete exercises on bed vs floor due to difficulty transferring  Bike 5' seat 8 : 373ft no LOB;  forward flexed posture, NBOS, scissor, Rt hip drop  Supine: Quad set 10x 5" Hamstring stretch 2x 30" with hands behind knee Bridge 10x 5"  12/13/23 physical therapy evaluation and HEP instruction    PATIENT EDUCATION:  Education details: Patient educated on exam findings, POC, scope of PT, HEP, and what to expect next visit. Person educated: Patient Education method: Explanation, Demonstration, and Handouts Education comprehension: verbalized understanding, returned demonstration, verbal cues required, and tactile cues required  HOME EXERCISE PROGRAM: 12/24/23 - Supine Bridge  - 2 x daily - 7 x weekly - 1 sets - 10 reps - 5" hold - Straight Leg Raise  - 2 x daily - 7 x weekly - 1 sets - 10 reps - Clam with Resistance  - 2 x daily - 7 x weekly - 1 sets - 10 reps - 5" hold - Short Arc Quad with Ankle Weight  - 1 x daily - 7 x weekly - 2 sets - 10 reps Access Code: [email or web address] URL: [email or web address] Date: 12/13/2023 Prepared by: [facility]  Exercises - Supine Quad Set  - 2 x daily - 7 x weekly - 1 sets - 10 reps - 5 sec hold - Supine Knee Extension Stretch on Towel Roll  - 2 x daily - 7 x weekly - 1 sets - 1 reps - 3 minutes hold - Supine Hamstring Stretch  - 2 x daily - 7 x weekly - 1 sets - 5 reps - 20 sec  hold  ASSESSMENT:  CLINICAL IMPRESSION: Session focus with proximal and functional strengthening.  Added step down for eccentric quad strengthening with cueing for controlled movements.  Also added balance for stability and leg press for posterior chain strengthening.  Cueing for posture, form and controlled movements with new exercises.  SLS based exercises were difficult due to weakness.  Pt tolerated well with no reports of increased pain through session.     Eval:  Patient is a 89 y.o. female who was seen today for physical therapy evaluation and treatment for M62.81 (ICD-10-CM) - Muscle weakness (generalized).  Patient demonstrates muscle weakness, reduced ROM, and fascial restrictions which are likely contributing to symptoms of pain and are negatively impacting patient ability to perform ADLs and functional mobility tasks. Patient will benefit from skilled physical therapy services to address these deficits to reduce pain and improve level of function with ADLs and functional mobility tasks.   OBJECTIVE IMPAIRMENTS: Abnormal gait, decreased activity tolerance, decreased mobility, difficulty walking, decreased ROM, decreased strength, hypomobility, increased edema, increased fascial restrictions, impaired perceived functional ability, and pain.   ACTIVITY LIMITATIONS: standing, stairs, and locomotion level  PARTICIPATION LIMITATIONS: shopping, community activity, and yard work  [facility] POTENTIAL: Good  CLINICAL DECISION MAKING: Stable/uncomplicated  EVALUATION COMPLEXITY: Low   GOALS: Goals reviewed with patient? No  SHORT TERM  GOALS: Target date: 12/27/23 patient will be independent with initial HEP  Baseline: Goal status: INITIAL  2.  Patient will self report 50% improvement to improve tolerance for functional activity  Baseline:  Goal status: INITIAL  3.  Patient will increase right knee extension to -4 or better to improve knee extension with heel strike Baseline:  Goal  status: INITIAL   LONG TERM GOALS: Target date: 01/04/2024  Patient will be independent in self management strategies to improve quality of life and functional outcomes.  Baseline:  Goal status: INITIAL  2.  Patient will self report 75% improvement to improve tolerance for functional activity  Baseline:  Goal status: INITIAL  3.  Patient will increase right knee extension to -2 or better to improve heel strike and knee extension with ambulation level and unlevel surfaces Baseline:  Goal status: INITIAL  4.  Patient will increase right  leg MMTs to 4+ to 5/5 without pain to promote return to ambulation community distances with minimal deviation.  Baseline:  Goal status: INITIAL  5.  Patient will improve 5 times sit to stand score to 15 sec or less to demonstrate improved functional mobility and increased leg strength.   Baseline: 18.79 sec Goal status: INITIAL    PLAN:  PT FREQUENCY: 2x/week  PT DURATION: 3 weeks  PLANNED INTERVENTIONS: 97164- PT Re-evaluation, 97110-Therapeutic exercises, 97530- Therapeutic activity, 97112- Neuromuscular re-education, 97535- Self Care, 45409- Manual therapy, [phone]- Gait training, [phone]- Orthotic Fit/training, [phone]- Canalith repositioning, [id number]- Aquatic Therapy, [phone]- Splinting, Patient/Family education, Balance training, Stair training, Taping, Dry Needling, Joint mobilization, Joint manipulation, Spinal manipulation, Spinal mobilization, Scar mobilization, and DME instructions.   PLAN FOR NEXT SESSION: Right knee mobility and strength.  Review goals next session.  Add sidestep and vector stance for gluteal stability,  Becky Sax, LPTA/CLT; CBIS [phone]  12:10 PM, 12/31/23

## 2024-01-03 ENCOUNTER — Ambulatory Visit (HOSPITAL_COMMUNITY): Payer: Medicare Other

## 2024-01-03 DIAGNOSIS — G8929 Other chronic pain: Secondary | ICD-10-CM

## 2024-01-03 DIAGNOSIS — M6281 Muscle weakness (generalized): Secondary | ICD-10-CM | POA: Diagnosis not present

## 2024-01-03 DIAGNOSIS — M25561 Pain in right knee: Secondary | ICD-10-CM | POA: Diagnosis not present

## 2024-01-03 DIAGNOSIS — R262 Difficulty in walking, not elsewhere classified: Secondary | ICD-10-CM

## 2024-01-03 NOTE — Therapy (Signed)
 OUTPATIENT PHYSICAL THERAPY LOWER EXTREMITY TREATMENT/DISCHARGE PHYSICAL THERAPY DISCHARGE SUMMARY  Visits from Start of Care: 6  Current functional level related to goals / functional outcomes: See below   Remaining deficits: See below   Education / Equipment: HEP   Patient agrees to discharge. Patient goals were partially met. Patient is being discharged due to being pleased with the current functional level.    Patient Name: Miranda [name] MRN: [id number] DOB:1934/04/03, 89 y.o., female Today's Date: 01/03/2024  END OF SESSION:  PT End of Session - 01/03/24 0935     Visit Number 6    Number of Visits 6    Date for PT Re-Evaluation 01/04/24    Authorization Type Medicare    Progress Note Due on Visit 10    PT Start Time 0933    PT Stop Time 1013    PT Time Calculation (min) 40 min    Activity Tolerance Patient tolerated treatment well    Behavior During Therapy [facility] for tasks assessed/performed             Past Medical History:  Diagnosis Date   Asthma    Breast cancer (HCC) 2020   INVASIVE DUCTAL CARCINOMA/DUCTAL CARCINOMA IN SITU/MARGINS UNINVOLVED BY CARCINOMA   Essential hypertension, benign    History of kidney stones    Mixed hyperlipidemia    Past Surgical History:  Procedure Laterality Date   ABDOMINAL HYSTERECTOMY     excessive bleeding, no cancer    BREAST LUMPECTOMY Right 2020   INVASIVE DUCTAL CARCINOMA/DUCTAL CARCINOMA IN situ/MARGINS UNINVOLVED BY CARCINOMA   CARPAL TUNNEL RELEASE Left 09/06/2021   Procedure: CARPAL TUNNEL RELEASE;  Surgeon: Margrette Taft BRAVO, MD;  Location: [facility];  Service: Orthopedics;  Laterality: Left;   GASTRORRHAPHY N/A 03/21/2023   Procedure: GASTRORRHAPHY;  Surgeon: Mavis Anes, MD;  Location: [facility];  Service: General;  Laterality: N/A;   LAPAROTOMY N/A 03/21/2023   Procedure: EXPLORATORY LAPAROTOMY;  Surgeon: Mavis Anes, MD;  Location: [facility];  Service: General;  Laterality: N/A;   Left knee arthroscopy  due to torn ligament and cartiallege  2006   [facility]    Left lung - lobectomy lower lobe - lung cancer     RADIOACTIVE SEED GUIDED PARTIAL MASTECTOMY WITH AXILLARY SENTINEL LYMPH NODE BIOPSY Right 06/24/2019   Procedure: RADIOACTIVE SEED GUIDED RIGHT BREAST PARTIAL MASTECTOMY WITH  SENTINEL LYMPH NODE BIOPSY;  Surgeon: Vernetta Berg, MD;  Location: [facility];  Service: General;  Laterality: Right;   Patient Active Problem List   Diagnosis Date Noted   Perforated viscus 03/21/2023   History of breast cancer 03/21/2023   Hypotension due to hypovolemia 03/21/2023   Perforated peptic ulcer (HCC) 03/20/2023   Carpal tunnel syndrome, left upper limb    Malignant neoplasm of lower-inner quadrant of right breast of female, estrogen receptor positive (HCC) 06/12/2019   Spondylolisthesis of lumbar region 02/03/2018   Lumbar spondylosis 02/03/2018   PSVT (paroxysmal supraventricular tachycardia) (HCC) 01/05/2012   Near syncope 12/05/2011   Palpitations 12/05/2011   Abnormal ECG 12/05/2011   HYPERLIPIDEMIA 11/10/2006   Essential hypertension, benign 11/10/2006    PCP: Norleen JENEANE Hurst, MD  REFERRING PROVIDER: Hurst Norleen JENEANE, MD  REFERRING DIAG: [phone] (ICD-10-CM) - Muscle weakness (generalized)  THERAPY DIAG:  Muscle weakness (generalized)  Difficulty in walking, not elsewhere classified  Chronic pain of right knee  Rationale for Evaluation and Treatment: Rehabilitation  ONSET DATE: April of 2024  SUBJECTIVE:   SUBJECTIVE STATEMENT: Feeling fine today; leg is much  better; fluid going down in the right knee.  About 70% better  Eval:Had some surgery in April of this past year and legs feel weak since then; was in the hospital for about 9 days.  Surgery was on the stomach. Saw Dr. Shona and he referred for physical therapy.  She attends the [employer] a couple times a week for activities; games and such.  Lives alone but her son checks on her frequently. Her Right knee buckles  sometimes  PERTINENT HISTORY: Left knee surgery years ago per Dr. Margrette 2006 PAIN:  Are you having pain? No  PRECAUTIONS: None  RED FLAGS: None   WEIGHT BEARING RESTRICTIONS: No  FALLS:  Has patient fallen in last 6 months? No   OCCUPATION: retired  PLOF: Independent  PATIENT GOALS: get my legs stronger  NEXT MD VISIT: every 6 months; next appt in March  OBJECTIVE:  Note: Objective measures were completed at Evaluation unless otherwise noted.  DIAGNOSTIC FINDINGS: none  PATIENT SURVEYS:  LEFS 75/80 93.8%  COGNITION: Overall cognitive status: Within functional limits for tasks assessed     SENSATION: WFL  EDEMA:  Right knee swelling noted  POSTURE: rounded shoulders and forward head  PALPATION: Puffiness noted right knee; valgus and extension lag  LOWER EXTREMITY ROM:  Active ROM Right eval Left eval Right 12/24/23 Right 01/03/24  Hip flexion      Hip extension      Hip abduction      Hip adduction      Hip internal rotation      Hip external rotation      Knee flexion 128 133 131 130  Knee extension -7 0 -2 -2  Ankle dorsiflexion      Ankle plantarflexion      Ankle inversion      Ankle eversion       (Blank rows = not tested)  LOWER EXTREMITY MMT:  MMT Right eval Left eval Right 01/03/24  Hip flexion 4- 5 5  Hip extension     Hip abduction     Hip adduction     Hip internal rotation     Hip external rotation     Knee flexion     Knee extension 4 5 5   Ankle dorsiflexion 4+ 5 5  Ankle plantarflexion     Ankle inversion     Ankle eversion      (Blank rows = not tested)  FUNCTIONAL TESTS:  5 times sit to stand: 18.79 sec using hands on thighs 2 minute walk test: 12/19/23:  32ft no LOB;  forward flexed posture, NBOS, scissor gait, Rt hip drop  GAIT: Distance walked: 80 ft in clinic  Assistive device utilized: None Level of assistance: Modified independence Comments: slight antalgic gait; decreased stance right lower  extremity  TREATMENT DATE:  01/03/24 Progress note LEFS 77/80 96.3% 5 times sit to stand 15.19 sec no hands MMT's and AROM see above 2 MWT 433 ft no AD Bike seat 8 x 5' for mobility Review of HEP   12/31/23: Bike seat 8 x 5' L3 resistance dynamic warm up STS to heel raise 10x 5 no HHA Forward step up 4in 10x Lateral step up 4in 10x Step down 4in 10x SLS Rt 5, Lt 3 max of 5 Leg press 4Pl 2x 10    12/26/23 Bike seat 8 x 5' L3 resistance dynamic warm up Standing: Heel raises 15x 5 TKE 10x 5 STS 10x eccentric control  Forward step up 4in 10x Lateral step up 4in 10x Minisquat front of chair 10x Sidestep GTB around thigh 3RT Slant board 3x 30 Tandem stance 2x 30   12/24/23 Bike seat 8 x 5' dynamic warm up Standing: Heel raises 2 x 10 Hamstring stretch 5 x 20 on 6 step Slant board stretch 5 x 20  Hip abduction 2 x 10 Hip extension 2 x 10 Seated Hamstring stretch with strap 5 x 20 Supine: AROM right knee -2 to 131 SLR 2 x 5 SAQ's 2# 3 x 10        12/19/23:   Reviewed goals Educated importance of HEP compliance Encouraged to complete exercises on bed vs floor due to difficulty transferring  Bike 5' seat 8 : 33ft no LOB;  forward flexed posture, NBOS, scissor, Rt hip drop  Supine: Quad set 10x 5 Hamstring stretch 2x 30 with hands behind knee Bridge 10x 5  12/13/23 physical therapy evaluation and HEP instruction    PATIENT EDUCATION:  Education details: Patient educated on exam findings, POC, scope of PT, HEP, and what to expect next visit. Person educated: Patient Education method: Explanation, Demonstration, and Handouts Education comprehension: verbalized understanding, returned demonstration, verbal cues required, and tactile cues required  HOME EXERCISE PROGRAM: 12/24/23 - Supine Bridge  - 2 x daily - 7 x  weekly - 1 sets - 10 reps - 5 hold - Straight Leg Raise  - 2 x daily - 7 x weekly - 1 sets - 10 reps - Clam with Resistance  - 2 x daily - 7 x weekly - 1 sets - 10 reps - 5 hold - Short Arc Quad with Ankle Weight  - 1 x daily - 7 x weekly - 2 sets - 10 reps Access Code: [email or web address] URL: [email or web address] Date: 12/13/2023 Prepared by: [facility] - Re[facility]  Exercises - Supine Quad Set  - 2 x daily - 7 x weekly - 1 sets - 10 reps - 5 sec hold - Supine Knee Extension Stretch on Towel Roll  - 2 x daily - 7 x weekly - 1 sets - 1 reps - 3 minutes hold - Supine Hamstring Stretch  - 2 x daily - 7 x weekly - 1 sets - 5 reps - 20 sec hold  ASSESSMENT:  CLINICAL IMPRESSION: Progress note today; good improvement with all functional scores, LEFS, mobility and strength.  Patient is pleased with current status and is agreeable to discharge at this time.    Eval:  Patient is a 89 y.o. female who was seen today for physical therapy evaluation and treatment for M62.81 (ICD-10-CM) - Muscle weakness (generalized).  Patient demonstrates muscle weakness, reduced ROM, and fascial restrictions which are likely contributing to symptoms of pain and are negatively impacting patient ability to perform ADLs and functional mobility tasks. Patient will benefit from skilled  physical therapy services to address these deficits to reduce pain and improve level of function with ADLs and functional mobility tasks.   OBJECTIVE IMPAIRMENTS: Abnormal gait, decreased activity tolerance, decreased mobility, difficulty walking, decreased ROM, decreased strength, hypomobility, increased edema, increased fascial restrictions, impaired perceived functional ability, and pain.   ACTIVITY LIMITATIONS: standing, stairs, and locomotion level  PARTICIPATION LIMITATIONS: shopping, community activity, and yard work  [facility] POTENTIAL: Good  CLINICAL DECISION MAKING: Stable/uncomplicated  EVALUATION COMPLEXITY: Low   GOALS: Goals  reviewed with patient? No  SHORT TERM GOALS: Target date: 12/27/23 patient will be independent with initial HEP  Baseline: Goal status: met  2.  Patient will self report 50% improvement to improve tolerance for functional activity  Baseline:  Goal status: met  3.  Patient will increase right knee extension to -4 or better to improve knee extension with heel strike Baseline:  Goal status: met   LONG TERM GOALS: Target date: 01/04/2024  Patient will be independent in self management strategies to improve quality of life and functional outcomes.  Baseline:  Goal status: met  2.  Patient will self report 75% improvement to improve tolerance for functional activity  Baseline:  Goal status: INITIAL  3.  Patient will increase right knee extension to -2 or better to improve heel strike and knee extension with ambulation level and unlevel surfaces Baseline:  Goal status: met  4.  Patient will increase right  leg MMTs to 4+ to 5/5 without pain to promote return to ambulation community distances with minimal deviation.  Baseline:  Goal status: met  5.  Patient will improve 5 times sit to stand score to 15 sec or less to demonstrate improved functional mobility and increased leg strength.   Baseline: 18.79 sec; 15. 19 sec Goal status: met    PLAN:  PT FREQUENCY: 2x/week  PT DURATION: 3 weeks  PLANNED INTERVENTIONS: 97164- PT Re-evaluation, 97110-Therapeutic exercises, 97530- Therapeutic activity, 97112- Neuromuscular re-education, 97535- Self Care, [id number]- Manual therapy, [phone]- Gait training, [phone]- Orthotic Fit/training, [phone]- Canalith repositioning, [id number]- Aquatic Therapy, [phone]- Splinting, Patient/Family education, Balance training, Stair training, Taping, Dry Needling, Joint mobilization, Joint manipulation, Spinal manipulation, Spinal mobilization, Scar mobilization, and DME instructions.   PLAN FOR NEXT SESSION:discharge  10:16 AM, 01/03/24 Isaia Hassell Small Tyshea Imel MPT [facility] [place] [phone]

## 2024-01-23 DIAGNOSIS — H402231 Chronic angle-closure glaucoma, bilateral, mild stage: Secondary | ICD-10-CM | POA: Diagnosis not present

## 2024-02-08 DIAGNOSIS — E782 Mixed hyperlipidemia: Secondary | ICD-10-CM | POA: Diagnosis not present

## 2024-02-08 DIAGNOSIS — N182 Chronic kidney disease, stage 2 (mild): Secondary | ICD-10-CM | POA: Diagnosis not present

## 2024-02-08 DIAGNOSIS — R7303 Prediabetes: Secondary | ICD-10-CM | POA: Diagnosis not present

## 2024-02-08 DIAGNOSIS — E876 Hypokalemia: Secondary | ICD-10-CM | POA: Diagnosis not present

## 2024-02-08 DIAGNOSIS — M8580 Other specified disorders of bone density and structure, unspecified site: Secondary | ICD-10-CM | POA: Diagnosis not present

## 2024-02-09 LAB — LAB REPORT - SCANNED
A1c: 6.1
Albumin, Urine POC: <3
Albumin/Creatinine Ratio, Urine, POC: <3
Creatinine, POC: 118 mg/dL
EGFR: 50

## 2024-02-13 NOTE — Progress Notes (Unsigned)
 Cardiology Office Note:    Date:  02/13/2024   ID:  Miranda [name], DOB 04-23-1934, MRN [id number]  PCP:  Benita Stabile, MD   [facility] Providers Cardiologist:  Maisie Fus, MD     Referring MD: Benita Stabile, MD   No chief complaint on file. Paroxysmal Afib  History of Present Illness:    Miranda [name] is a 89 y.o. female with a hx of asthma, HTN, referral for afib.   She was recently hospitalized for a perforated peptic ulcer in late April in [place]. She developed Afib rates 149 bpm at that time. She was not seen by cardiology, no discussion of AC. She is still on her aspirin and has not had any bleeding issues. She states she has hx of afib. She is on a BB. She was diagnosed 2 -4 years ago. No one discussed risk of CVA or AC. She has not been on [facility]. She saw Dr. Diona Browner in 2013 for PSVT. Stress echo was done which did not show signs of ischemia. No hx of CVA. No DM2.   Interval hx 08/29/2023 Blood pressure at home are up and down. Otherwise she doing well.  Past Medical History:  Diagnosis Date   Asthma    Breast cancer (HCC) 2020   INVASIVE DUCTAL CARCINOMA/DUCTAL CARCINOMA IN SITU/MARGINS UNINVOLVED BY CARCINOMA   Essential hypertension, benign    History of kidney stones    Mixed hyperlipidemia     Past Surgical History:  Procedure Laterality Date   ABDOMINAL HYSTERECTOMY     excessive bleeding, no cancer    BREAST LUMPECTOMY Right 2020   INVASIVE DUCTAL CARCINOMA/DUCTAL CARCINOMA IN situ/MARGINS UNINVOLVED BY CARCINOMA   CARPAL TUNNEL RELEASE Left 09/06/2021   Procedure: CARPAL TUNNEL RELEASE;  Surgeon: Vickki Hearing, MD;  Location: [facility];  Service: Orthopedics;  Laterality: Left;   GASTRORRHAPHY N/A 03/21/2023   Procedure: GASTRORRHAPHY;  Surgeon: Franky Macho, MD;  Location: [facility];  Service: General;  Laterality: N/A;   LAPAROTOMY N/A 03/21/2023   Procedure: EXPLORATORY LAPAROTOMY;  Surgeon: Franky Macho, MD;  Location: [facility];   Service: General;  Laterality: N/A;   Left knee arthroscopy due to torn ligament and cartiallege  2006   [facility]    Left lung - lobectomy lower lobe - lung cancer     RADIOACTIVE SEED GUIDED PARTIAL MASTECTOMY WITH AXILLARY SENTINEL LYMPH NODE BIOPSY Right 06/24/2019   Procedure: RADIOACTIVE SEED GUIDED RIGHT BREAST PARTIAL MASTECTOMY WITH  SENTINEL LYMPH NODE BIOPSY;  Surgeon: Abigail Miyamoto, MD;  Location: [facility];  Service: General;  Laterality: Right;    Current Medications: No outpatient medications have been marked as taking for the 02/14/24 encounter (Appointment) with Maisie Fus, MD.     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[employer] [place]   Tobacco Use   Smoking status: Never   Smokeless tobacco: Never  Vaping Use   Vaping status: Never Used  Substance and Sexual Activity   Alcohol use: Yes    Comment: occasional   Drug use: No   Sexual activity: Not on file  Other Topics Concern   Not on file  Social History Narrative   Not on file  Social Drivers of Corporate investment banker Strain: Not on file  Food Insecurity: No Food Insecurity (03/21/2023)   Hunger Vital Sign    Worried About Running Out of Food in the Last Year: Never true    Ran Out of Food in the Last Year: Never true  Transportation Needs: No Transportation Needs (03/21/2023)   PRAPARE - Administrator, Civil Service (Medical): No    Lack of Transportation (Non-Medical): No  Physical Activity: Not on file  Stress: Not on file  Social Connections: Not on file     Family History: The patient's family history includes Anuerysm in her mother; Arthritis in an other family member; Cancer in an other family member; Colon cancer in her father; Heart  attack in her mother; Hypertension in her sister, sister, and son; Melanoma in her sister; Stroke in her sister.  ROS:   Please see the history of present illness.     All other systems reviewed and are negative.  EKGs/Labs/Other Studies Reviewed:    The following studies were reviewed today:  EKG Interpretation Date/Time:    Ventricular Rate:    PR Interval:    QRS Duration:    QT Interval:    QTC Calculation:   R Axis:      Text Interpretation:         Recent Labs: 03/26/2023: ALT 33 03/29/2023: BUN 6; Creatinine, Ser 0.75; Hemoglobin 10.7; Magnesium 1.7; Platelets 238; Potassium 3.1; Sodium 137  Recent Lipid Panel    Component Value Date/Time   CHOL 144 12/19/2008 0555   TRIG 60 12/19/2008 0555   HDL 59 12/19/2008 0555   CHOLHDL 2.4 Ratio 12/19/2008 0555   VLDL 12 12/19/2008 0555   LDLCALC 73 12/19/2008 0555     Risk Assessment/Calculations:    CHA2DS2-VASc Score = 4   This indicates a 4.8% annual risk of stroke. The patient's score is based upon: CHF History: 0 HTN History: 1 Diabetes History: 0 Stroke History: 0 Vascular Disease History: 0 Age Score: 2 Gender Score: 1   {This patient has a significant risk of stroke if diagnosed with atrial fibrillation.  Please consider VKA or DOAC agent for anticoagulation if the bleeding risk is acceptable.   You can also use the SmartPhrase .[email or web address] for documentation.   :[id number]}   Cardiac Monitor 14 day 05/21/2023 No Afib.      TTE 06/19/2023 EF 60-65% RV nl, mild PH RVSP 41 mmHg LA size 36 cc//m2  No significant valve dx  Physical Exam:    VS:    There were no vitals filed for this visit.   There were no vitals taken for this visit.    Wt Readings from Last 3 Encounters:  08/29/23 151 lb (68.5 kg)  08/20/23 149 lb 14.4 oz (68 kg)  05/21/23 141 lb 3.2 oz (64 kg)     Physical Exam Gen: well appearing Neuro: alert and oriented CV: r,r,r no murmurs.  Pulm: CLAB Abd: non distended Ext: No  LE edema Skin: warm and well perfused Psych: normal mood   ASSESSMENT:    No diagnosis found. She was managed for a perforated peptic ulcer in late April s/p ex lap and gastrorrhaphy on 4/24. She is not on [facility] currently, will start today. Discussed signs of GI bleed and to stop eliquis if this were to occur. She is in sinus rhythm today. She can continue BB. Continue eliquis 5 mg BID  HTN *** Was elevated prior Wants to avoid norvasc  with ankle swelling risk - on telmisartan 80 mg daily - on BB - cont spironolactone 25 mg daily   PLAN:    In order of problems listed above:  Follow up 6 months with an APP  Medication Adjustments/Labs and Tests Ordered: Current medicines are reviewed at length with the patient today.  Concerns regarding medicines are outlined above.  No orders of the defined types were placed in this encounter.  No orders of the defined types were placed in this encounter.   There are no Patient Instructions on file for this visit.   Signed, Maisie Fus, MD  02/13/2024 3:59 PM     [facility]

## 2024-02-14 ENCOUNTER — Other Ambulatory Visit (HOSPITAL_COMMUNITY): Payer: Self-pay

## 2024-02-14 ENCOUNTER — Ambulatory Visit: Attending: Cardiovascular Disease | Admitting: Internal Medicine

## 2024-02-14 ENCOUNTER — Telehealth: Payer: Self-pay

## 2024-02-14 VITALS — BP 124/63 | HR 56 | Ht 60.0 in | Wt 151.0 lb

## 2024-02-14 DIAGNOSIS — M5442 Lumbago with sciatica, left side: Secondary | ICD-10-CM | POA: Diagnosis not present

## 2024-02-14 DIAGNOSIS — Z85118 Personal history of other malignant neoplasm of bronchus and lung: Secondary | ICD-10-CM | POA: Diagnosis not present

## 2024-02-14 DIAGNOSIS — N182 Chronic kidney disease, stage 2 (mild): Secondary | ICD-10-CM | POA: Diagnosis not present

## 2024-02-14 DIAGNOSIS — K265 Chronic or unspecified duodenal ulcer with perforation: Secondary | ICD-10-CM | POA: Diagnosis not present

## 2024-02-14 DIAGNOSIS — I480 Paroxysmal atrial fibrillation: Secondary | ICD-10-CM | POA: Insufficient documentation

## 2024-02-14 DIAGNOSIS — I129 Hypertensive chronic kidney disease with stage 1 through stage 4 chronic kidney disease, or unspecified chronic kidney disease: Secondary | ICD-10-CM | POA: Diagnosis not present

## 2024-02-14 DIAGNOSIS — R600 Localized edema: Secondary | ICD-10-CM | POA: Diagnosis not present

## 2024-02-14 DIAGNOSIS — I4891 Unspecified atrial fibrillation: Secondary | ICD-10-CM | POA: Diagnosis not present

## 2024-02-14 DIAGNOSIS — M8580 Other specified disorders of bone density and structure, unspecified site: Secondary | ICD-10-CM | POA: Diagnosis not present

## 2024-02-14 DIAGNOSIS — R7303 Prediabetes: Secondary | ICD-10-CM | POA: Diagnosis not present

## 2024-02-14 DIAGNOSIS — C50311 Malignant neoplasm of lower-inner quadrant of right female breast: Secondary | ICD-10-CM | POA: Diagnosis not present

## 2024-02-14 DIAGNOSIS — E876 Hypokalemia: Secondary | ICD-10-CM | POA: Diagnosis not present

## 2024-02-14 DIAGNOSIS — E782 Mixed hyperlipidemia: Secondary | ICD-10-CM | POA: Diagnosis not present

## 2024-02-14 MED ORDER — SPIRONOLACTONE 25 MG PO TABS
25.0000 mg | ORAL_TABLET | Freq: Every day | ORAL | 3 refills | Status: DC
  Filled 2024-02-14: qty 90, 90d supply, fill #0

## 2024-02-14 MED ORDER — SPIRONOLACTONE 25 MG PO TABS: 25.0000 mg | ORAL_TABLET | Freq: Every day | ORAL | 3 refills | Status: DC

## 2024-02-14 NOTE — Telephone Encounter (Signed)
  Patient Consent for Virtual Visit        Miranda [name] has provided verbal consent on 02/14/2024 for a virtual visit (video or telephone).   CONSENT FOR VIRTUAL VISIT FOR:  Miranda [name]  By participating in this virtual visit I agree to the following:  I hereby voluntarily request, consent and authorize [facility] and its employed or contracted physicians, physician assistants, nurse practitioners or other licensed health care professionals (the Practitioner), to provide me with telemedicine health care services (the "Services") as deemed necessary by the treating Practitioner. I acknowledge and consent to receive the Services by the Practitioner via telemedicine. I understand that the telemedicine visit will involve communicating with the Practitioner through live audiovisual communication technology and the disclosure of certain medical information by electronic transmission. I acknowledge that I have been given the opportunity to request an in-person assessment or other available alternative prior to the telemedicine visit and am voluntarily participating in the telemedicine visit.  I understand that I have the right to withhold or withdraw my consent to the use of telemedicine in the course of my care at any time, without affecting my right to future care or treatment, and that the Practitioner or I may terminate the telemedicine visit at any time. I understand that I have the right to inspect all information obtained and/or recorded in the course of the telemedicine visit and may receive copies of available information for a reasonable fee.  I understand that some of the potential risks of receiving the Services via telemedicine include:  Delay or interruption in medical evaluation due to technological equipment failure or disruption; Information transmitted may not be sufficient (e.g. poor resolution of images) to allow for appropriate medical decision making by the  Practitioner; and/or  In rare instances, security protocols could fail, causing a breach of personal health information.  Furthermore, I acknowledge that it is my responsibility to provide information about my medical history, conditions and care that is complete and accurate to the best of my ability. I acknowledge that Practitioner's advice, recommendations, and/or decision may be based on factors not within their control, such as incomplete or inaccurate data provided by me or distortions of diagnostic images or specimens that may result from electronic transmissions. I understand that the practice of medicine is not an exact science and that Practitioner makes no warranties or guarantees regarding treatment outcomes. I acknowledge that a copy of this consent can be made available to me via my patient portal [facility] MyChart), or I can request a printed copy by calling the office of [facility].    I understand that my insurance will be billed for this visit.   I have read or had this consent read to me. I understand the contents of this consent, which adequately explains the benefits and risks of the Services being provided via telemedicine.  I have been provided ample opportunity to ask questions regarding this consent and the Services and have had my questions answered to my satisfaction. I give my informed consent for the services to be provided through the use of telemedicine in my medical care

## 2024-02-14 NOTE — Patient Instructions (Addendum)
 Medication Instructions:  Your physician recommends that you continue on your current medications as directed. Please refer to the Current Medication list given to you today.  *If you need a refill on your cardiac medications before your next appointment, please call your pharmacy*  Follow-Up: At [facility], you and your health needs are our priority.  As part of our continuing mission to provide you with exceptional heart care, we have created designated Provider Care Teams.  These Care Teams include your primary Cardiologist (physician) and Advanced Practice Providers (APPs -  Physician Assistants and Nurse Practitioners) who all work together to provide you with the care you need, when you need it.   Your next appointment:   6 month(s)  Provider:   Dr. Dina Rich  Other Instructions Your physician has recommended that you wear an event monitor. Event monitors are medical devices that record the heart's electrical activity. Doctors most often [place] these monitors to diagnose arrhythmias. Arrhythmias are problems with the speed or rhythm of the heartbeat. The monitor is a small, portable device. You can wear one while you do your normal daily activities. This is usually used to diagnose what is causing palpitations/syncope (passing out).

## 2024-02-22 ENCOUNTER — Ambulatory Visit: Payer: Medicare Other | Admitting: Internal Medicine

## 2024-02-25 ENCOUNTER — Ambulatory Visit (INDEPENDENT_AMBULATORY_CARE_PROVIDER_SITE_OTHER): Admitting: Orthopedic Surgery

## 2024-02-25 ENCOUNTER — Other Ambulatory Visit (INDEPENDENT_AMBULATORY_CARE_PROVIDER_SITE_OTHER)

## 2024-02-25 DIAGNOSIS — M19011 Primary osteoarthritis, right shoulder: Secondary | ICD-10-CM | POA: Diagnosis not present

## 2024-02-25 DIAGNOSIS — M25511 Pain in right shoulder: Secondary | ICD-10-CM | POA: Diagnosis not present

## 2024-02-25 MED ORDER — METHYLPREDNISOLONE ACETATE 40 MG/ML IJ SUSP
40.0000 mg | Freq: Once | INTRAMUSCULAR | Status: AC
  Administered 2024-02-25: 40 mg via INTRA_ARTICULAR

## 2024-02-25 NOTE — Progress Notes (Signed)
 Chief Complaint  Patient presents with   Shoulder Pain    Right- a little over two months but pain has increased has lost motion has taken tylenol with some relief using biofreeze and I've tried to exercise it but it really hurts    THIS IS AN 89 YO FEMALE who presents with a painful right shoulder.  Pain began over several months and worsened and is now associated with inability to abduct or lift the arm overhead  She is on Eliquis for an arrhythmia so she is been using topical medication such as horse liniment and menthol creams along with Tylenol without relief  Scheduled for beach trip would like her shoulder evaluated  Recent bout of ulcer disease requiring emergency surgery.  During that time she was noted to have a heart arrhythmia was put on the Eliquis.  She is considering having that stopped after Holter monitoring if it proves to be devoid of any heart arrhythmias  Right shoulder abduction 45 degrees flexion 60 degrees external rotation 0 degrees  Tender over the posterior joint line and anterior joint line  X-rays show glenohumeral arthritis with loss of the normal contour of the humeral head, proximal migration impingement of the acromion  Assessment and plan  Encounter Diagnoses  Name Primary?   Acute pain of right shoulder Yes   Arthritis of right glenohumeral joint      Procedure note the subacromial injection shoulder RIGHT    Verbal consent was obtained to inject the  RIGHT   Shoulder  Timeout was completed to confirm the injection site is a subacromial space of the  RIGHT  shoulder   Medication used Depo-Medrol 40 mg and lidocaine 1% 3 cc  Anesthesia was provided by ethyl chloride  The injection was performed in the RIGHT  posterior subacromial space. After pinning the skin with alcohol and anesthetized the skin with ethyl chloride the subacromial space was injected using a 20-gauge needle. There were no complications  Sterile dressing was applied.     Recheck on the 14th if no improvement consider joint injection  And physical therapy

## 2024-02-25 NOTE — Progress Notes (Signed)
  Intake history:  There were no vitals taken for this visit. There is no height or weight on file to calculate BMI.    WHAT ARE WE SEEING YOU FOR TODAY?   right shoulder  How Miranda [name] has this bothered you? (DOI?DOS?WS?)  1 month(s) ago  Anticoag.  Yes  Diabetes No  Heart disease Yes  Hypertension Yes  SMOKING HX No  Kidney disease No  Any ALLERGIES ______________________________________________   Treatment:  Have you taken:  Tylenol Yes  Advil No  Had PT No  Had injection No  Other  _________________________

## 2024-03-10 ENCOUNTER — Ambulatory Visit: Admitting: Orthopedic Surgery

## 2024-03-24 ENCOUNTER — Telehealth: Payer: Self-pay | Admitting: Internal Medicine

## 2024-03-24 DIAGNOSIS — M51362 Other intervertebral disc degeneration, lumbar region with discogenic back pain and lower extremity pain: Secondary | ICD-10-CM | POA: Diagnosis not present

## 2024-03-24 DIAGNOSIS — M9905 Segmental and somatic dysfunction of pelvic region: Secondary | ICD-10-CM | POA: Diagnosis not present

## 2024-03-24 NOTE — Telephone Encounter (Signed)
 Spoke with patient and she states eliquis  is to high. It will cost $600 and she is not paying that and she is not getting on a payment plan. She would like to discuss other options.   Former Dr. Amanda Jungling patient but now will be seeing Dr. Mallipeddi

## 2024-03-24 NOTE — Telephone Encounter (Signed)
 Pt c/o medication issue:  1. Name of Medication:   apixaban  (ELIQUIS ) 5 MG TABS tablet   2. How are you currently taking this medication (dosage and times per day)?   As prescribed  3. Are you having a reaction (difficulty breathing--STAT)?   4. What is your medication issue?   Patient stated the refill for this medication is too expensive and she wants to get alternate medication.

## 2024-03-24 NOTE — Telephone Encounter (Signed)
 Request sent to our medication assistance department.

## 2024-03-25 ENCOUNTER — Telehealth: Payer: Self-pay | Admitting: Pharmacy Technician

## 2024-03-25 NOTE — Telephone Encounter (Signed)
 PAP: Patient assistance application for Eliquis  through [employer] ([employer]) has been mailed to pt's home address on file. Provider portion of application will be faxed to provider's office.    I called the patient and also explained her options

## 2024-03-26 DIAGNOSIS — M9905 Segmental and somatic dysfunction of pelvic region: Secondary | ICD-10-CM | POA: Diagnosis not present

## 2024-03-26 DIAGNOSIS — M51362 Other intervertebral disc degeneration, lumbar region with discogenic back pain and lower extremity pain: Secondary | ICD-10-CM | POA: Diagnosis not present

## 2024-03-27 DIAGNOSIS — M9905 Segmental and somatic dysfunction of pelvic region: Secondary | ICD-10-CM | POA: Diagnosis not present

## 2024-03-27 DIAGNOSIS — M51362 Other intervertebral disc degeneration, lumbar region with discogenic back pain and lower extremity pain: Secondary | ICD-10-CM | POA: Diagnosis not present

## 2024-03-27 NOTE — Telephone Encounter (Signed)
 Miranda [name], CPhT     03/25/24  9:12 AM Note PAP: Patient assistance application for Eliquis  through [employer] ([employer]) has been mailed to pt's home address on file. Provider portion of application will be faxed to provider's office.      I called the patient and also explained her options

## 2024-04-02 DIAGNOSIS — M9905 Segmental and somatic dysfunction of pelvic region: Secondary | ICD-10-CM | POA: Diagnosis not present

## 2024-04-02 DIAGNOSIS — M51362 Other intervertebral disc degeneration, lumbar region with discogenic back pain and lower extremity pain: Secondary | ICD-10-CM | POA: Diagnosis not present

## 2024-04-03 DIAGNOSIS — M51362 Other intervertebral disc degeneration, lumbar region with discogenic back pain and lower extremity pain: Secondary | ICD-10-CM | POA: Diagnosis not present

## 2024-04-03 DIAGNOSIS — M9905 Segmental and somatic dysfunction of pelvic region: Secondary | ICD-10-CM | POA: Diagnosis not present

## 2024-04-07 DIAGNOSIS — M9905 Segmental and somatic dysfunction of pelvic region: Secondary | ICD-10-CM | POA: Diagnosis not present

## 2024-04-07 DIAGNOSIS — M51362 Other intervertebral disc degeneration, lumbar region with discogenic back pain and lower extremity pain: Secondary | ICD-10-CM | POA: Diagnosis not present

## 2024-04-09 DIAGNOSIS — M51362 Other intervertebral disc degeneration, lumbar region with discogenic back pain and lower extremity pain: Secondary | ICD-10-CM | POA: Diagnosis not present

## 2024-04-09 DIAGNOSIS — M9905 Segmental and somatic dysfunction of pelvic region: Secondary | ICD-10-CM | POA: Diagnosis not present

## 2024-04-10 DIAGNOSIS — M51362 Other intervertebral disc degeneration, lumbar region with discogenic back pain and lower extremity pain: Secondary | ICD-10-CM | POA: Diagnosis not present

## 2024-04-10 DIAGNOSIS — M9905 Segmental and somatic dysfunction of pelvic region: Secondary | ICD-10-CM | POA: Diagnosis not present

## 2024-04-10 NOTE — Telephone Encounter (Signed)
 I spoke to the patient and she wants to call her insurance and see about medicare payment plan. She said she does not want to do the payment assistance.

## 2024-04-14 DIAGNOSIS — M9905 Segmental and somatic dysfunction of pelvic region: Secondary | ICD-10-CM | POA: Diagnosis not present

## 2024-04-14 DIAGNOSIS — M51362 Other intervertebral disc degeneration, lumbar region with discogenic back pain and lower extremity pain: Secondary | ICD-10-CM | POA: Diagnosis not present

## 2024-04-16 DIAGNOSIS — M51362 Other intervertebral disc degeneration, lumbar region with discogenic back pain and lower extremity pain: Secondary | ICD-10-CM | POA: Diagnosis not present

## 2024-04-16 DIAGNOSIS — M9905 Segmental and somatic dysfunction of pelvic region: Secondary | ICD-10-CM | POA: Diagnosis not present

## 2024-04-17 DIAGNOSIS — M51362 Other intervertebral disc degeneration, lumbar region with discogenic back pain and lower extremity pain: Secondary | ICD-10-CM | POA: Diagnosis not present

## 2024-04-17 DIAGNOSIS — M9905 Segmental and somatic dysfunction of pelvic region: Secondary | ICD-10-CM | POA: Diagnosis not present

## 2024-04-28 DIAGNOSIS — M51362 Other intervertebral disc degeneration, lumbar region with discogenic back pain and lower extremity pain: Secondary | ICD-10-CM | POA: Diagnosis not present

## 2024-04-28 DIAGNOSIS — M9905 Segmental and somatic dysfunction of pelvic region: Secondary | ICD-10-CM | POA: Diagnosis not present

## 2024-06-12 DIAGNOSIS — B355 Tinea imbricata: Secondary | ICD-10-CM | POA: Diagnosis not present

## 2024-07-07 ENCOUNTER — Other Ambulatory Visit (HOSPITAL_COMMUNITY): Payer: Self-pay | Admitting: Internal Medicine

## 2024-07-07 DIAGNOSIS — Z1231 Encounter for screening mammogram for malignant neoplasm of breast: Secondary | ICD-10-CM

## 2024-07-11 ENCOUNTER — Ambulatory Visit (HOSPITAL_COMMUNITY)
Admission: RE | Admit: 2024-07-11 | Discharge: 2024-07-11 | Disposition: A | Discharge status: Home / Self Care | Source: Ambulatory Visit | Attending: Internal Medicine | Admitting: Internal Medicine

## 2024-07-11 DIAGNOSIS — Z1231 Encounter for screening mammogram for malignant neoplasm of breast: Secondary | ICD-10-CM | POA: Diagnosis not present

## 2024-07-14 DIAGNOSIS — H402231 Chronic angle-closure glaucoma, bilateral, mild stage: Secondary | ICD-10-CM | POA: Diagnosis not present

## 2024-07-14 DIAGNOSIS — H26492 Other secondary cataract, left eye: Secondary | ICD-10-CM | POA: Diagnosis not present

## 2024-07-14 DIAGNOSIS — H04123 Dry eye syndrome of bilateral lacrimal glands: Secondary | ICD-10-CM | POA: Diagnosis not present

## 2024-07-14 DIAGNOSIS — H02403 Unspecified ptosis of bilateral eyelids: Secondary | ICD-10-CM | POA: Diagnosis not present

## 2024-07-31 ENCOUNTER — Other Ambulatory Visit: Payer: Self-pay | Admitting: Hematology and Oncology

## 2024-08-07 DIAGNOSIS — B355 Tinea imbricata: Secondary | ICD-10-CM | POA: Diagnosis not present

## 2024-08-11 DIAGNOSIS — E782 Mixed hyperlipidemia: Secondary | ICD-10-CM | POA: Diagnosis not present

## 2024-08-11 DIAGNOSIS — M8580 Other specified disorders of bone density and structure, unspecified site: Secondary | ICD-10-CM | POA: Diagnosis not present

## 2024-08-11 DIAGNOSIS — R7303 Prediabetes: Secondary | ICD-10-CM | POA: Diagnosis not present

## 2024-08-11 DIAGNOSIS — E876 Hypokalemia: Secondary | ICD-10-CM | POA: Diagnosis not present

## 2024-08-11 DIAGNOSIS — E559 Vitamin D deficiency, unspecified: Secondary | ICD-10-CM | POA: Diagnosis not present

## 2024-08-18 ENCOUNTER — Encounter: Payer: Self-pay | Admitting: Internal Medicine

## 2024-08-18 ENCOUNTER — Telehealth: Payer: Self-pay

## 2024-08-18 ENCOUNTER — Ambulatory Visit: Attending: Internal Medicine | Admitting: Internal Medicine

## 2024-08-18 VITALS — BP 130/80 | HR 51 | Ht 61.0 in | Wt 166.6 lb

## 2024-08-18 DIAGNOSIS — R062 Wheezing: Secondary | ICD-10-CM

## 2024-08-18 DIAGNOSIS — Z7901 Long term (current) use of anticoagulants: Secondary | ICD-10-CM | POA: Diagnosis not present

## 2024-08-18 DIAGNOSIS — I480 Paroxysmal atrial fibrillation: Secondary | ICD-10-CM | POA: Insufficient documentation

## 2024-08-18 DIAGNOSIS — R0609 Other forms of dyspnea: Secondary | ICD-10-CM

## 2024-08-18 NOTE — Telephone Encounter (Signed)
 Per Dr.Mallpeddi after patient's visit:  Echo for DOE/wheezing. I forgot to tell patient.  Called patient and advised her of Echo order placed and that someone from scheduling will contact her to schedule the appointment. Patient verbalized understanding

## 2024-08-18 NOTE — Patient Instructions (Addendum)
 Medication Instructions:  Your physician has recommended you make the following change in your medication:  Increase Metoprolol  Tartrate 25 mg from once daily to twice daily Stop taking Spironolactone   Continue taking all other medications as prescribed   Labwork: None  Testing/Procedures: None  Follow-Up: Your physician recommends that you schedule a follow-up appointment in: 1 year. You will receive a reminder call in about 8 months reminding you to schedule your appointment. If you don't receive this call, please contact our office.   Any Other Special Instructions Will Be Listed Below (If Applicable). Thank you for choosing [facility]!     If you need a refill on your cardiac medications before your next appointment, please call your pharmacy.

## 2024-08-18 NOTE — Progress Notes (Signed)
 Cardiology Office Note  Date: 08/18/2024   ID: Miranda, [name] 07-Oct-1934, MRN [id number]  PCP:  Shona Norleen PEDLAR, MD  Cardiologist:  Diannah SHAUNNA Maywood, MD Electrophysiologist:  None   History of Present Illness: Miranda [name] is a 89 y.o. female  For follow-up of A-fib.  Newly diagnosed with atrial fibrillation in the setting of perforated gastric ulcer.  Started on Eliquis  after the ulcer was repaired.  No bleeding complications on Eliquis .  She reports having DOE/exertional wheezing in the last 1 month.  No orthopnea, PND, leg swelling.  No angina.  No dizziness, lightness, syncope.  She reports having palpitations at night.  Currently takes metoprolol  tartrate 25 mg once daily in the a.m.  When she experienced palpitations at night, she takes metoprolol .  Past Medical History:  Diagnosis Date   Asthma    Breast cancer (HCC) 2020   INVASIVE DUCTAL CARCINOMA/DUCTAL CARCINOMA IN SITU/MARGINS UNINVOLVED BY CARCINOMA   Essential hypertension, benign    History of kidney stones    Mixed hyperlipidemia     Past Surgical History:  Procedure Laterality Date   ABDOMINAL HYSTERECTOMY     excessive bleeding, no cancer    BREAST LUMPECTOMY Right 2020   INVASIVE DUCTAL CARCINOMA/DUCTAL CARCINOMA IN situ/MARGINS UNINVOLVED BY CARCINOMA   CARPAL TUNNEL RELEASE Left 09/06/2021   Procedure: CARPAL TUNNEL RELEASE;  Surgeon: Margrette Taft BRAVO, MD;  Location: [facility];  Service: Orthopedics;  Laterality: Left;   GASTRORRHAPHY N/A 03/21/2023   Procedure: GASTRORRHAPHY;  Surgeon: Mavis Anes, MD;  Location: [facility];  Service: General;  Laterality: N/A;   LAPAROTOMY N/A 03/21/2023   Procedure: EXPLORATORY LAPAROTOMY;  Surgeon: Mavis Anes, MD;  Location: [facility];  Service: General;  Laterality: N/A;   Left knee arthroscopy due to torn ligament and cartiallege  2006   [facility]    Left lung - lobectomy lower lobe - lung cancer     RADIOACTIVE SEED GUIDED PARTIAL MASTECTOMY WITH  AXILLARY SENTINEL LYMPH NODE BIOPSY Right 06/24/2019   Procedure: RADIOACTIVE SEED GUIDED RIGHT BREAST PARTIAL MASTECTOMY WITH  SENTINEL LYMPH NODE BIOPSY;  Surgeon: Vernetta Berg, MD;  Location: [facility];  Service: General;  Laterality: Right;    Current Outpatient Medications  Medication Sig Dispense Refill   acetaminophen  (TYLENOL ) 650 MG CR tablet Take 650 mg by mouth daily as needed for pain.     anastrozole  (ARIMIDEX ) 1 MG tablet Take 1 tablet by mouth once daily 90 tablet 2   apixaban  (ELIQUIS ) 5 MG TABS tablet Take 1 tablet (5 mg total) by mouth 2 (two) times daily. 60 tablet 11   Cholecalciferol (VITAMIN D-3 PO) Take 1 capsule by mouth daily.     latanoprost  (XALATAN ) 0.005 % ophthalmic solution Place 1 drop into both eyes at bedtime.  3   metoprolol  tartrate (LOPRESSOR ) 25 MG tablet Take 25 mg by mouth 2 (two) times daily.  3   pantoprazole  (PROTONIX ) 40 MG tablet Take 1 tablet (40 mg total) by mouth daily. 30 tablet 1   Polyvinyl Alcohol-Povidone (REFRESH OP) Place 1 drop into both eyes daily as needed (dry eyes).     rosuvastatin  (CRESTOR ) 10 MG tablet Take 10 mg by mouth daily.     telmisartan (MICARDIS) 80 MG tablet Take 80 mg by mouth daily.     timolol  (TIMOPTIC ) 0.5 % ophthalmic solution Place 1 drop into both eyes daily.     torsemide (DEMADEX) 20 MG tablet Take 20 mg by mouth daily as needed (swelling).  No current facility-administered medications for this visit.   Allergies:  Bee venom   Social History: The patient  reports that she has never smoked. She has never used smokeless tobacco. She reports current alcohol use. She reports that she does not use drugs.   Family History: The patient's family history includes Anuerysm in her mother; Arthritis in an other family member; Cancer in an other family member; Colon cancer in her father; Heart attack in her mother; Hypertension in her sister, sister, and son; Melanoma in her sister; Stroke in her sister.   ROS:   Please see the history of present illness. Otherwise, complete review of systems is positive for none  All other systems are reviewed and negative.   Physical Exam: VS:  BP 130/80   Pulse (!) 51   Ht 5' 1 (1.549 m)   Wt 166 lb 9.6 oz (75.6 kg)   SpO2 99%   BMI 31.48 kg/m , BMI Body mass index is 31.48 kg/m.  Wt Readings from Last 3 Encounters:  08/18/24 166 lb 9.6 oz (75.6 kg)  02/14/24 151 lb (68.5 kg)  08/29/23 151 lb (68.5 kg)    General: Patient appears comfortable at rest. HEENT: Conjunctiva and lids normal, oropharynx clear with moist mucosa. Neck: Supple, no elevated JVP or carotid bruits, no thyromegaly. Lungs: Clear to auscultation, nonlabored breathing at rest. Cardiac: Regular rate and rhythm, no S3 or significant systolic murmur, no pericardial rub. Abdomen: Soft, nontender, no hepatomegaly, bowel sounds present, no guarding or rebound. Extremities: No pitting edema, distal pulses 2+. Skin: Warm and dry. Musculoskeletal: No kyphosis. Neuropsychiatric: Alert and oriented x3, affect grossly appropriate.  Recent Labwork: No results found for requested labs within last 365 days.     Component Value Date/Time   CHOL 144 12/19/2008 0555   TRIG 60 12/19/2008 0555   HDL 59 12/19/2008 0555   CHOLHDL 2.4 Ratio 12/19/2008 0555   VLDL 12 12/19/2008 0555   LDLCALC 73 12/19/2008 0555    Assessment and Plan:  Paroxysmal atrial fibrillation - New onset in April 2024 in the setting of ex lap and gastrorrhaphy. - EKG today showed NSR, HR 51 bpm. -  currently on metoprolol  tartrate 25 mg once daily. - Asymptomatic except for palpitations at night when she takes metoprolol  occasionally at night and the palpitations will resolve. - Switch metoprolol  tartrate from 25 mg once daily to twice daily. - Continue Eliquis  5 mg twice daily. - No bleeding complications on Eliquis . - No risk of falls.  No falls in the last 1 year.  DOE - Ongoing DOE/wheezing with exertion in the  last 1 month.  Prior echocardiogram from 2024 was unremarkable.  Repeat echocardiogram.  HTN, controlled - Home blood pressures range around 130 mmHg SBP.  Had leg swelling with amlodipine.  Continue telmisartan 80 mg once daily.  She was started on spironolactone  by Dr. Ronal Ross, patient stopped taking it as she felt cold.  30 minutes spent in review the prior records, imaging, test/reports, discussion of the above problems with the patient, documentation and answering all her questions.     Medication Adjustments/Labs and Tests Ordered: Current medicines are reviewed at length with the patient today.  Concerns regarding medicines are outlined above.    Disposition:  Follow up 1 year  Signed Fadia Marlar Arleta Maywood, MD, 08/18/2024 12:29 PM    [facility] [address] [place], [place], [place] [address]

## 2024-08-19 ENCOUNTER — Inpatient Hospital Stay: Payer: Medicare Other | Attending: Hematology and Oncology | Admitting: Hematology and Oncology

## 2024-08-19 VITALS — BP 166/71 | HR 64 | Temp 97.6°F | Resp 17 | Ht 61.0 in | Wt 165.2 lb

## 2024-08-19 DIAGNOSIS — Z1732 Human epidermal growth factor receptor 2 negative status: Secondary | ICD-10-CM | POA: Insufficient documentation

## 2024-08-19 DIAGNOSIS — Z79811 Long term (current) use of aromatase inhibitors: Secondary | ICD-10-CM | POA: Insufficient documentation

## 2024-08-19 DIAGNOSIS — C50311 Malignant neoplasm of lower-inner quadrant of right female breast: Secondary | ICD-10-CM | POA: Diagnosis not present

## 2024-08-19 DIAGNOSIS — Z170 Estrogen receptor positive status [ER+]: Secondary | ICD-10-CM | POA: Insufficient documentation

## 2024-08-19 DIAGNOSIS — Z1721 Progesterone receptor positive status: Secondary | ICD-10-CM | POA: Insufficient documentation

## 2024-08-19 NOTE — Assessment & Plan Note (Signed)
 06/16/2019: Right lumpectomy:Right lumpectomy Jan): IDC with intermediate grade DCIS, 0.6cm, grade 2, lymphovascular space invasion present, clear margins, and one right axillary lymph node negative. grade 1, HER-2 - (0), ER+ 100%, PR+ 70%, Ki67 5%. T1BN0 stage Ia   Current treatment: Anastrozole  1 mg daily started 07/01/2019 Anastrozole  toxicities: Denies any adverse effects to anastrozole  therapy Hospitalization 03/20/2023 -03/29/2023: Abdominal pain from perforated peptic ulcer status post exploratory laparotomy and gastrorrhaphy 03/21/2023   Breast cancer surveillance: 1.  Breast exam: 08/19/2024: Benign 2. mammogram 07/11/2024: Benign breast density category B   Return to clinic in 1 year for follow-up

## 2024-08-19 NOTE — Progress Notes (Signed)
 Patient Care Team: Shona Norleen PEDLAR, MD as PCP - General (Internal Medicine) Mallipeddi, Diannah SQUIBB, MD as PCP - Cardiology (Cardiology) Odean Potts, MD as Consulting Physician (Hematology and Oncology) Vernetta Berg, MD as Consulting Physician (General Surgery)  DIAGNOSIS:  Encounter Diagnosis  Name Primary?   Malignant neoplasm of lower-inner quadrant of right breast of female, estrogen receptor positive (HCC) Yes    SUMMARY OF ONCOLOGIC HISTORY: Oncology History  Malignant neoplasm of lower-inner quadrant of right breast of female, estrogen receptor positive (HCC)  06/12/2019 Initial Diagnosis   Screening mammogram detected right breast asymmetry. Diagnostic mammogram and US  showed 0.5cm right breast mass. Biopsy showed IDC, grade 1, HER-2 - (0), ER+ 100%, PR+ 70%, Ki67 5%.    06/12/2019 Cancer Staging   Staging form: Breast, AJCC 8th Edition - Clinical stage from 06/12/2019: Stage IA (cT1a, cN0, cM0, G2, ER+, PR+, HER2-) - Signed by Odean Potts, MD on 06/17/2019   06/24/2019 Surgery   Right lumpectomy Jan): IDC with intermediate grade DCIS, 0.6cm, grade 2, lymphovascular space invasion present, clear margins, and one right axillary lymph node negative.    06/24/2019 Cancer Staging   Staging form: Breast, AJCC 8th Edition - Pathologic stage from 06/24/2019: Stage IA (pT1a, pN0, cM0, G2, ER+, PR+, HER2-) - Signed by Crawford Morna Pickle, NP on 10/01/2019   06/2019 -  Anti-estrogen oral therapy   Anastrozole  daily     CHIEF COMPLIANT: Surveillance of breast cancer on anastrozole  therapy  HISTORY OF PRESENT ILLNESS:  History of Present Illness Miranda [name] is an 89 year old female who presents for discontinuation of long-term medication following successful treatment for breast cancer.  She has completed five and a half years of anastrozole  therapy for estrogen receptor-positive breast cancer and is now discontinuing the medication. A mammogram on August 20th  showed normal results. She experiences no breast pain or discomfort and continues regular health monitoring.     ALLERGIES:  is allergic to bee venom.  MEDICATIONS:  Current Outpatient Medications  Medication Sig Dispense Refill   acetaminophen  (TYLENOL ) 650 MG CR tablet Take 650 mg by mouth daily as needed for pain.     amLODipine (NORVASC) 2.5 MG tablet Take 2.5 mg by mouth daily.     anastrozole  (ARIMIDEX ) 1 MG tablet Take 1 tablet by mouth once daily 90 tablet 2   apixaban  (ELIQUIS ) 5 MG TABS tablet Take 1 tablet (5 mg total) by mouth 2 (two) times daily. 60 tablet 11   Cholecalciferol (VITAMIN D-3 PO) Take 1 capsule by mouth daily.     latanoprost  (XALATAN ) 0.005 % ophthalmic solution Place 1 drop into both eyes at bedtime.  3   metoprolol  tartrate (LOPRESSOR ) 25 MG tablet Take 25 mg by mouth 2 (two) times daily.  3   pantoprazole  (PROTONIX ) 40 MG tablet Take 1 tablet (40 mg total) by mouth daily. 30 tablet 1   Polyvinyl Alcohol-Povidone (REFRESH OP) Place 1 drop into both eyes daily as needed (dry eyes).     rosuvastatin  (CRESTOR ) 10 MG tablet Take 10 mg by mouth daily.     telmisartan (MICARDIS) 80 MG tablet Take 80 mg by mouth daily.     timolol  (TIMOPTIC ) 0.5 % ophthalmic solution Place 1 drop into both eyes daily.     torsemide (DEMADEX) 20 MG tablet Take 20 mg by mouth daily as needed (swelling).     No current facility-administered medications for this visit.    PHYSICAL EXAMINATION: ECOG PERFORMANCE STATUS: 1 - Symptomatic but  completely ambulatory  Vitals:   08/19/24 0911  BP: (!) 166/71  Pulse: 64  Resp: 17  Temp: 97.6 F (36.4 C)  SpO2: 99%   Filed Weights   08/19/24 0911  Weight: 165 lb 3.2 oz (74.9 kg)    Physical Exam   (exam performed in the presence of a chaperone)  LABORATORY DATA:  I have reviewed the data as listed    Latest Ref Rng & Units 03/29/2023    4:29 AM 03/28/2023    3:34 AM 03/26/2023    5:04 AM  CMP  Glucose 70 - 99 mg/dL 897  885   878   BUN 8 - 23 mg/dL 6  9  13    Creatinine 0.44 - 1.00 mg/dL 9.24  9.19  9.18   Sodium 135 - 145 mmol/L 137  138  144   Potassium 3.5 - 5.1 mmol/L 3.1  2.9  3.9   Chloride 98 - 111 mmol/L 105  107  111   CO2 22 - 32 mmol/L 25  24  23    Calcium  8.9 - 10.3 mg/dL 8.0  8.1  8.7   Total Protein 6.5 - 8.1 g/dL   6.2   Total Bilirubin 0.3 - 1.2 mg/dL   1.0   Alkaline Phos 38 - 126 U/L   60   AST 15 - 41 U/L   35   ALT 0 - 44 U/L   33     Lab Results  Component Value Date   WBC 8.3 03/29/2023   HGB 10.7 (L) 03/29/2023   HCT 33.0 (L) 03/29/2023   MCV 84.4 03/29/2023   PLT 238 03/29/2023   NEUTROABS 2.7 12/19/2008    ASSESSMENT & PLAN:  Malignant neoplasm of lower-inner quadrant of right breast of female, estrogen receptor positive (HCC) 06/16/2019: Right lumpectomy:Right lumpectomy Jan): IDC with intermediate grade DCIS, 0.6cm, grade 2, lymphovascular space invasion present, clear margins, and one right axillary lymph node negative. grade 1, HER-2 - (0), ER+ 100%, PR+ 70%, Ki67 5%. T1BN0 stage Ia   Current treatment: Anastrozole  1 mg daily started 07/01/2019 Anastrozole  toxicities: Denies any adverse effects to anastrozole  therapy Hospitalization 03/20/2023 -03/29/2023: Abdominal pain from perforated peptic ulcer status post exploratory laparotomy and gastrorrhaphy 03/21/2023   Breast cancer surveillance: 1.  Breast exam: 08/19/2024: Benign 2. mammogram 07/11/2024: Benign breast density category B   Return to clinic on an as-needed basis Assessment & Plan Malignant neoplasm of right breast, estrogen receptor positive Completed over five years of anastrozole  therapy. Recent mammogram normal. Benefits of anastrozole  expected to last over ten years. - Discontinued anastrozole  1 mg oral daily. - Continue annual mammograms.      No orders of the defined types were placed in this encounter.  The patient has a good understanding of the overall plan. she agrees with it. she will call  with any problems that may develop before the next visit here. Total time spent: 30 mins including face to face time and time spent for planning, charting and co-ordination of care   Naomi MARLA Chad, MD 08/19/24

## 2024-08-20 ENCOUNTER — Ambulatory Visit: Attending: Internal Medicine

## 2024-08-20 ENCOUNTER — Other Ambulatory Visit: Admitting: Internal Medicine

## 2024-08-20 DIAGNOSIS — R0609 Other forms of dyspnea: Secondary | ICD-10-CM | POA: Diagnosis not present

## 2024-08-20 DIAGNOSIS — R062 Wheezing: Secondary | ICD-10-CM | POA: Diagnosis not present

## 2024-08-20 MED ORDER — PERFLUTREN LIPID MICROSPHERE
1.0000 mL | INTRAVENOUS | Status: AC | PRN
  Administered 2024-08-20: 6 mL via INTRAVENOUS

## 2024-08-21 LAB — ECHOCARDIOGRAM COMPLETE
AR max vel: 2.23 cm2
AV Peak grad: 6.2 mmHg
Ao pk vel: 1.24 m/s
Area-P 1/2: 1.97 cm2
Calc EF: 78.9 %
S' Lateral: 2.9 cm
Single Plane A2C EF: 82 %
Single Plane A4C EF: 72.4 %

## 2024-08-28 ENCOUNTER — Ambulatory Visit: Payer: Self-pay | Admitting: Internal Medicine

## 2024-08-28 NOTE — Telephone Encounter (Signed)
-----   Message from Vishnu P Mallipeddi sent at 08/28/2024 10:08 AM EDT ----- Normal LV function, moderate LVH, G1 DD, normal RV function, no valvular heart disease, CVP 3 mmHg.  Moderate pulmonary hypertension.  Mild pulmonary hypertension in 2024.  Unclear if DOE is related  to increased lung pressures.  Currently on furosemide 20 mg daily as needed, will switch to 10 mg daily scheduled medication and evaluate for any symptom relief.  Schedule 23-month follow-up with  [facility]/MD. ----- Message ----- From: Interface, [employer] Sent: 08/21/2024  12:43 PM EDT To: Vishnu P Mallipeddi, MD

## 2024-08-28 NOTE — Telephone Encounter (Signed)
 The patient has been notified of the result and verbalized understanding.  All questions (if any) were answered. Updated patient's medication to Torsemide 10 mg daily. Sent schedulers message for 3 month follow up and copied PCP Littie CHRISTELLA Croak, CMA 08/28/2024 2:56 PM

## 2024-09-11 ENCOUNTER — Other Ambulatory Visit (HOSPITAL_COMMUNITY): Payer: Self-pay | Admitting: Internal Medicine

## 2024-09-11 ENCOUNTER — Encounter: Payer: Self-pay | Admitting: Internal Medicine

## 2024-09-11 DIAGNOSIS — R7303 Prediabetes: Secondary | ICD-10-CM | POA: Diagnosis not present

## 2024-09-11 DIAGNOSIS — I4891 Unspecified atrial fibrillation: Secondary | ICD-10-CM | POA: Diagnosis not present

## 2024-09-11 DIAGNOSIS — M8580 Other specified disorders of bone density and structure, unspecified site: Secondary | ICD-10-CM

## 2024-09-11 DIAGNOSIS — I129 Hypertensive chronic kidney disease with stage 1 through stage 4 chronic kidney disease, or unspecified chronic kidney disease: Secondary | ICD-10-CM | POA: Diagnosis not present

## 2024-09-11 DIAGNOSIS — M5442 Lumbago with sciatica, left side: Secondary | ICD-10-CM | POA: Diagnosis not present

## 2024-09-11 DIAGNOSIS — K265 Chronic or unspecified duodenal ulcer with perforation: Secondary | ICD-10-CM | POA: Diagnosis not present

## 2024-09-11 DIAGNOSIS — M8588 Other specified disorders of bone density and structure, other site: Secondary | ICD-10-CM

## 2024-09-11 DIAGNOSIS — Z0000 Encounter for general adult medical examination without abnormal findings: Secondary | ICD-10-CM | POA: Diagnosis not present

## 2024-09-11 DIAGNOSIS — Z23 Encounter for immunization: Secondary | ICD-10-CM | POA: Diagnosis not present

## 2024-09-11 DIAGNOSIS — R600 Localized edema: Secondary | ICD-10-CM | POA: Diagnosis not present

## 2024-09-11 DIAGNOSIS — E782 Mixed hyperlipidemia: Secondary | ICD-10-CM | POA: Diagnosis not present

## 2024-09-11 DIAGNOSIS — E876 Hypokalemia: Secondary | ICD-10-CM | POA: Diagnosis not present

## 2024-09-11 DIAGNOSIS — N182 Chronic kidney disease, stage 2 (mild): Secondary | ICD-10-CM | POA: Diagnosis not present

## 2024-09-22 ENCOUNTER — Encounter: Payer: Self-pay | Admitting: Orthopedic Surgery

## 2024-09-22 ENCOUNTER — Ambulatory Visit: Admitting: Orthopedic Surgery

## 2024-09-22 DIAGNOSIS — M19041 Primary osteoarthritis, right hand: Secondary | ICD-10-CM

## 2024-09-22 DIAGNOSIS — M19011 Primary osteoarthritis, right shoulder: Secondary | ICD-10-CM | POA: Diagnosis not present

## 2024-09-22 DIAGNOSIS — M19049 Primary osteoarthritis, unspecified hand: Secondary | ICD-10-CM

## 2024-09-22 MED ORDER — METHYLPREDNISOLONE ACETATE 40 MG/ML IJ SUSP
40.0000 mg | Freq: Once | INTRAMUSCULAR | Status: AC
  Administered 2024-09-22: 40 mg via INTRA_ARTICULAR

## 2024-09-22 NOTE — Progress Notes (Signed)
 FOLLOW-UP OFFICE VISIT   Patient: Miranda [name]           Date of Birth: 08-26-1934           MRN: [id number] Visit Date: 09/22/2024 Requested by: Shona Norleen PEDLAR, MD [address] [place],  [place] [address] PCP: Shona Norleen PEDLAR, MD    Encounter Diagnoses  Name Primary?   Arthritis of right glenohumeral joint Yes   CMC arthritis - right     Chief Complaint  Patient presents with   Shoulder Pain    Right    Today Zehava comes in for recheck on her right shoulder she was doing exercises at home on her own and this improved her range of motion to where she can now get her hand on top of her head although she uses a lot of scapular substitution she is having mild pain  She has a new complaint today of burning pain over the right thumb at the base of the first metacarpal.  No trauma.  No excessive activity or overactivity  Examination shows active forward elevation of the right shoulder to where she can get her hand on her head but again she has a lot of scapular substitution  She has tenderness over the [facility] joint of the right thumb no pain over the first extensor compartment there is some tenderness over the wrist to suggest maybe a hint of intersection syndrome but this is mild  She agreed to [facility] joint injection and to continue exercises for her right shoulder  ASSESSMENT AND PLAN Encounter Diagnoses  Name Primary?   Arthritis of right glenohumeral joint Yes   CMC arthritis - right    Procedure note injection right thumb  Injection right CMC joint Medication Depo-Medrol  40 mg and lidocaine  1% 2 cc The patient gave verbal consent Timeout confirmed the site of injection right CMC joint  Alcohol and ethyl chloride was used to repair the skin and then a 25-gauge needle was used to inject the [facility] joint of the right thumb  There were no complications a sterile Band-Aid was applied

## 2024-09-22 NOTE — Progress Notes (Signed)
 de   09/22/2024   Chief Complaint  Patient presents with   Shoulder Pain    Right     No diagnosis found.  What pharmacy do you use ? _______[facility] ___________________  DOI/DOS/   Improved  But still has pain

## 2024-10-02 DIAGNOSIS — B355 Tinea imbricata: Secondary | ICD-10-CM | POA: Diagnosis not present

## 2024-12-02 ENCOUNTER — Encounter: Payer: Self-pay | Admitting: Internal Medicine

## 2024-12-02 ENCOUNTER — Ambulatory Visit: Attending: Internal Medicine | Admitting: Internal Medicine

## 2024-12-02 VITALS — BP 134/74 | HR 56 | Ht 61.0 in

## 2024-12-02 DIAGNOSIS — I480 Paroxysmal atrial fibrillation: Secondary | ICD-10-CM | POA: Diagnosis not present

## 2024-12-02 DIAGNOSIS — Z79899 Other long term (current) drug therapy: Secondary | ICD-10-CM | POA: Insufficient documentation

## 2024-12-02 DIAGNOSIS — I5032 Chronic diastolic (congestive) heart failure: Secondary | ICD-10-CM | POA: Diagnosis not present

## 2024-12-02 NOTE — Progress Notes (Signed)
 "    Cardiology Office Note  Date: 12/02/2024   ID: Miranda [name], Miranda [name] 07-Dec-1933, MRN [id number]  PCP:  Shona Norleen PEDLAR, MD  Cardiologist:  Diannah SHAUNNA Maywood, MD Electrophysiologist:  None   History of Present Illness: Miranda [name] is a 89 y.o. female  For follow-up of A-fib and CHF.  Newly diagnosed with atrial fibrillation in the setting of perforated gastric ulcer.  Started on Eliquis  after the ulcer was repaired.  No bleeding complications on Eliquis .  However she noticed DOE x few months for which echocardiogram was obtained that showed normal LV function, moderate LVH, G1 DD, normal RV function, no valvular heart disease, CVP 3 mmHg.  Moderate pulmonary hypertension was noted.  Previously it was mild pulmonary hypertension in 2024.  She was on p.o. Lasix 20 mg PRN, currently on torsemide 10 mg.  She was supposed to take torsemide 10 mg once daily however she is taking it few times a week.  She noticed her DOE and leg swelling improved.  Does not have any angina.  No dizziness, palpitations, syncope.  She is active at baseline.  She walks 2 miles on level ground.  She is feeling short of breath when she was walking (especially in the second mile).  Otherwise, she is able to do her daily chores at home with no issues.  Past Medical History:  Diagnosis Date   Asthma    Breast cancer (HCC) 2020   INVASIVE DUCTAL CARCINOMA/DUCTAL CARCINOMA IN SITU/MARGINS UNINVOLVED BY CARCINOMA   Essential hypertension, benign    History of kidney stones    Mixed hyperlipidemia     Past Surgical History:  Procedure Laterality Date   ABDOMINAL HYSTERECTOMY     excessive bleeding, no cancer    BREAST LUMPECTOMY Right 2020   INVASIVE DUCTAL CARCINOMA/DUCTAL CARCINOMA IN situ/MARGINS UNINVOLVED BY CARCINOMA   CARPAL TUNNEL RELEASE Left 09/06/2021   Procedure: CARPAL TUNNEL RELEASE;  Surgeon: Margrette Taft BRAVO, MD;  Location: [facility];  Service: Orthopedics;  Laterality: Left;   GASTRORRHAPHY  N/A 03/21/2023   Procedure: GASTRORRHAPHY;  Surgeon: Mavis Anes, MD;  Location: [facility];  Service: General;  Laterality: N/A;   LAPAROTOMY N/A 03/21/2023   Procedure: EXPLORATORY LAPAROTOMY;  Surgeon: Mavis Anes, MD;  Location: [facility];  Service: General;  Laterality: N/A;   Left knee arthroscopy due to torn ligament and cartiallege  2006   [facility]    Left lung - lobectomy lower lobe - lung cancer     RADIOACTIVE SEED GUIDED PARTIAL MASTECTOMY WITH AXILLARY SENTINEL LYMPH NODE BIOPSY Right 06/24/2019   Procedure: RADIOACTIVE SEED GUIDED RIGHT BREAST PARTIAL MASTECTOMY WITH  SENTINEL LYMPH NODE BIOPSY;  Surgeon: Vernetta Berg, MD;  Location: [facility];  Service: General;  Laterality: Right;    Current Outpatient Medications  Medication Sig Dispense Refill   acetaminophen  (TYLENOL ) 650 MG CR tablet Take 650 mg by mouth daily as needed for pain.     amLODipine (NORVASC) 2.5 MG tablet Take 2.5 mg by mouth daily.     apixaban  (ELIQUIS ) 5 MG TABS tablet Take 1 tablet (5 mg total) by mouth 2 (two) times daily. 60 tablet 11   Cholecalciferol (VITAMIN D-3 PO) Take 1 capsule by mouth daily.     latanoprost  (XALATAN ) 0.005 % ophthalmic solution Place 1 drop into both eyes at bedtime.  3   metoprolol  tartrate (LOPRESSOR ) 25 MG tablet Take 25 mg by mouth 2 (two) times daily.  3   pantoprazole  (PROTONIX ) 40 MG tablet  Take 1 tablet (40 mg total) by mouth daily. 30 tablet 1   Polyvinyl Alcohol-Povidone (REFRESH OP) Place 1 drop into both eyes daily as needed (dry eyes).     rosuvastatin  (CRESTOR ) 10 MG tablet Take 10 mg by mouth daily.     telmisartan (MICARDIS) 80 MG tablet Take 80 mg by mouth daily.     timolol  (TIMOPTIC ) 0.5 % ophthalmic solution Place 1 drop into both eyes daily.     torsemide (DEMADEX) 20 MG tablet Take 10 mg by mouth daily.     No current facility-administered medications for this visit.   Allergies:  Bee venom   Social History: The patient  reports that she has never  smoked. She has never used smokeless tobacco. She reports current alcohol use. She reports that she does not use drugs.   Family History: The patient's family history includes Anuerysm in her mother; Arthritis in an other family member; Cancer in an other family member; Colon cancer in her father; Heart attack in her mother; Hypertension in her sister, sister, and son; Melanoma in her sister; Stroke in her sister.   ROS:  Please see the history of present illness. Otherwise, complete review of systems is positive for none  All other systems are reviewed and negative.   Physical Exam: VS:  There were no vitals taken for this visit., BMI There is no height or weight on file to calculate BMI.  Wt Readings from Last 3 Encounters:  08/19/24 165 lb 3.2 oz (74.9 kg)  08/18/24 166 lb 9.6 oz (75.6 kg)  02/14/24 151 lb (68.5 kg)    General: Patient appears comfortable at rest. HEENT: Conjunctiva and lids normal, oropharynx clear with moist mucosa. Neck: Supple, no elevated JVP or carotid bruits, no thyromegaly. Lungs: Clear to auscultation, nonlabored breathing at rest. Cardiac: Regular rate and rhythm, no S3 or significant systolic murmur, no pericardial rub. Abdomen: Soft, nontender, no hepatomegaly, bowel sounds present, no guarding or rebound. Extremities: No pitting edema, distal pulses 2+. Skin: Warm and dry. Musculoskeletal: No kyphosis. Neuropsychiatric: Alert and oriented x3, affect grossly appropriate.  Recent Labwork: No results found for requested labs within last 365 days.     Component Value Date/Time   CHOL 144 12/19/2008 0555   TRIG 60 12/19/2008 0555   HDL 59 12/19/2008 0555   CHOLHDL 2.4 Ratio 12/19/2008 0555   VLDL 12 12/19/2008 0555   LDLCALC 73 12/19/2008 0555    Assessment and Plan:  Paroxysmal atrial fibrillation - New onset in April 2024 in the setting of ex lap and gastrorrhaphy. - Asymptomatic.  Continue metoprolol  at rate 25 mg twice daily. - Continue  Eliquis  5 mg daily. - No bleeding complications.  Chronic diastolic heart failure Moderate pulmonary hypertension - DOE and leg swelling x few months that improved with torsemide 10 mg few times a week.  She still has some SOB with exertion.  Start taking torsemide 10 mg once daily.  Obtain CMP in 2 weeks. - Active at baseline.  PE low in the differential.  HTN, controlled - continue metoprolol  at rate 25 mg twice daily, continue telmisartan 80 mg once daily and p.o. torsemide 10 mg once daily.  She was started on spironolactone  by Dr. Ronal Ross, patient stopped taking it as she felt cold.  30 minutes spent in review the prior records, imaging, test/reports, discussion of the above problems with the patient, documentation and answering all her questions.     Medication Adjustments/Labs and Tests Ordered: Current medicines are  reviewed at length with the patient today.  Concerns regarding medicines are outlined above.    Disposition:  Follow up 6 months  Signed Zaydan Papesh Priya Fynn Adel, MD, 12/02/2024 10:37 AM    [facility] [address] [place], [place], [place] [address]  "

## 2024-12-02 NOTE — Patient Instructions (Addendum)
 Medication Instructions:  Your physician has recommended you make the following change in your medication:  Take Torsemide 10 mg once daily  Continue taking all other medications as prescribed  Labwork: CMET in 2 weeks at [facility]   Testing/Procedures: None  Follow-Up: Your physician recommends that you schedule a follow-up appointment in: 6 months  Any Other Special Instructions Will Be Listed Below (If Applicable). Thank you for choosing [facility]!     If you need a refill on your cardiac medications before your next appointment, please call your pharmacy.

## 2024-12-30 ENCOUNTER — Ambulatory Visit: Payer: Self-pay | Admitting: Internal Medicine
